# Patient Record
Sex: Female | Born: 1955 | Race: Black or African American | Hispanic: No | Smoking: Current every day smoker
Health system: Southern US, Community
[De-identification: ages and names within clinical notes are randomized; demographics above are authoritative.]

## PROBLEM LIST (undated history)

## (undated) DIAGNOSIS — G709 Myoneural disorder, unspecified: Secondary | ICD-10-CM

## (undated) DIAGNOSIS — I10 Essential (primary) hypertension: Secondary | ICD-10-CM

## (undated) DIAGNOSIS — G1221 Amyotrophic lateral sclerosis: Secondary | ICD-10-CM

## (undated) DIAGNOSIS — I671 Cerebral aneurysm, nonruptured: Secondary | ICD-10-CM

## (undated) DIAGNOSIS — G43909 Migraine, unspecified, not intractable, without status migrainosus: Secondary | ICD-10-CM

## (undated) DIAGNOSIS — G35 Multiple sclerosis: Secondary | ICD-10-CM

## (undated) HISTORY — PX: BREAST BIOPSY: SHX20

---

## 1999-01-16 ENCOUNTER — Other Ambulatory Visit: Admission: RE | Admit: 1999-01-16 | Discharge: 1999-01-16 | Payer: Self-pay | Admitting: Family Medicine

## 1999-02-11 ENCOUNTER — Ambulatory Visit (HOSPITAL_COMMUNITY): Admission: RE | Admit: 1999-02-11 | Discharge: 1999-02-11 | Payer: Self-pay | Admitting: Family Medicine

## 1999-03-04 ENCOUNTER — Ambulatory Visit (HOSPITAL_COMMUNITY): Admission: RE | Admit: 1999-03-04 | Discharge: 1999-03-04 | Payer: Self-pay | Admitting: Family Medicine

## 2000-02-16 ENCOUNTER — Other Ambulatory Visit: Admission: RE | Admit: 2000-02-16 | Discharge: 2000-02-16 | Payer: Self-pay | Admitting: Family Medicine

## 2000-03-07 ENCOUNTER — Ambulatory Visit (HOSPITAL_COMMUNITY): Admission: RE | Admit: 2000-03-07 | Discharge: 2000-03-07 | Payer: Self-pay

## 2002-01-25 ENCOUNTER — Other Ambulatory Visit: Admission: RE | Admit: 2002-01-25 | Discharge: 2002-01-25 | Payer: Self-pay | Admitting: *Deleted

## 2002-01-29 ENCOUNTER — Encounter: Payer: Self-pay | Admitting: *Deleted

## 2002-01-29 ENCOUNTER — Ambulatory Visit (HOSPITAL_COMMUNITY): Admission: RE | Admit: 2002-01-29 | Discharge: 2002-01-29 | Payer: Self-pay | Admitting: *Deleted

## 2002-02-08 ENCOUNTER — Encounter: Payer: Self-pay | Admitting: *Deleted

## 2002-02-08 ENCOUNTER — Ambulatory Visit (HOSPITAL_COMMUNITY): Admission: RE | Admit: 2002-02-08 | Discharge: 2002-02-08 | Payer: Self-pay | Admitting: *Deleted

## 2004-01-05 ENCOUNTER — Emergency Department (HOSPITAL_COMMUNITY): Admission: EM | Admit: 2004-01-05 | Discharge: 2004-01-05 | Payer: Self-pay | Admitting: Emergency Medicine

## 2005-01-22 ENCOUNTER — Emergency Department (HOSPITAL_COMMUNITY): Admission: EM | Admit: 2005-01-22 | Discharge: 2005-01-22 | Payer: Self-pay | Admitting: Emergency Medicine

## 2007-05-31 ENCOUNTER — Inpatient Hospital Stay (HOSPITAL_COMMUNITY): Admission: AD | Admit: 2007-05-31 | Discharge: 2007-05-31 | Payer: Self-pay | Admitting: Gynecology

## 2007-06-02 ENCOUNTER — Inpatient Hospital Stay (HOSPITAL_COMMUNITY): Admission: AD | Admit: 2007-06-02 | Discharge: 2007-06-02 | Payer: Self-pay | Admitting: Gynecology

## 2008-03-14 ENCOUNTER — Emergency Department (HOSPITAL_COMMUNITY): Admission: EM | Admit: 2008-03-14 | Discharge: 2008-03-14 | Payer: Self-pay | Admitting: Emergency Medicine

## 2008-03-14 ENCOUNTER — Inpatient Hospital Stay (HOSPITAL_COMMUNITY): Admission: AD | Admit: 2008-03-14 | Discharge: 2008-03-14 | Payer: Self-pay | Admitting: Gynecology

## 2008-05-08 ENCOUNTER — Inpatient Hospital Stay (HOSPITAL_COMMUNITY): Admission: AD | Admit: 2008-05-08 | Discharge: 2008-05-08 | Payer: Self-pay | Admitting: Obstetrics & Gynecology

## 2008-05-22 ENCOUNTER — Ambulatory Visit: Payer: Self-pay | Admitting: Obstetrics & Gynecology

## 2008-05-22 ENCOUNTER — Encounter: Payer: Self-pay | Admitting: Family

## 2008-05-22 ENCOUNTER — Other Ambulatory Visit: Admission: RE | Admit: 2008-05-22 | Discharge: 2008-05-22 | Payer: Self-pay | Admitting: Obstetrics & Gynecology

## 2008-06-20 ENCOUNTER — Ambulatory Visit: Payer: Self-pay | Admitting: Gynecology

## 2008-08-15 ENCOUNTER — Emergency Department (HOSPITAL_COMMUNITY): Admission: EM | Admit: 2008-08-15 | Discharge: 2008-08-15 | Payer: Self-pay | Admitting: Emergency Medicine

## 2008-08-26 ENCOUNTER — Ambulatory Visit: Payer: Self-pay | Admitting: Internal Medicine

## 2008-08-27 ENCOUNTER — Ambulatory Visit: Payer: Self-pay | Admitting: *Deleted

## 2008-09-19 ENCOUNTER — Ambulatory Visit: Payer: Self-pay | Admitting: Gynecology

## 2008-09-19 ENCOUNTER — Other Ambulatory Visit: Admission: RE | Admit: 2008-09-19 | Discharge: 2008-09-19 | Payer: Self-pay | Admitting: Obstetrics & Gynecology

## 2009-01-13 ENCOUNTER — Inpatient Hospital Stay (HOSPITAL_COMMUNITY): Admission: AD | Admit: 2009-01-13 | Discharge: 2009-01-13 | Payer: Self-pay | Admitting: Obstetrics & Gynecology

## 2009-05-08 ENCOUNTER — Emergency Department (HOSPITAL_COMMUNITY): Admission: EM | Admit: 2009-05-08 | Discharge: 2009-05-08 | Payer: Self-pay | Admitting: Family Medicine

## 2009-06-02 ENCOUNTER — Ambulatory Visit: Payer: Self-pay | Admitting: Obstetrics & Gynecology

## 2009-06-03 ENCOUNTER — Encounter: Payer: Self-pay | Admitting: Obstetrics & Gynecology

## 2009-06-05 ENCOUNTER — Ambulatory Visit: Payer: Self-pay | Admitting: Obstetrics and Gynecology

## 2009-06-06 ENCOUNTER — Encounter: Payer: Self-pay | Admitting: Obstetrics & Gynecology

## 2009-06-06 LAB — CONVERTED CEMR LAB

## 2009-06-14 ENCOUNTER — Emergency Department (HOSPITAL_COMMUNITY): Admission: EM | Admit: 2009-06-14 | Discharge: 2009-06-14 | Payer: Self-pay | Admitting: Family Medicine

## 2009-09-10 ENCOUNTER — Ambulatory Visit: Payer: Self-pay | Admitting: Family Medicine

## 2009-09-10 ENCOUNTER — Encounter: Payer: Self-pay | Admitting: Family Medicine

## 2009-09-18 ENCOUNTER — Ambulatory Visit (HOSPITAL_COMMUNITY): Admission: RE | Admit: 2009-09-18 | Discharge: 2009-09-18 | Payer: Self-pay | Admitting: Family Medicine

## 2009-11-05 ENCOUNTER — Ambulatory Visit: Payer: Self-pay | Admitting: Internal Medicine

## 2009-11-05 ENCOUNTER — Encounter: Payer: Self-pay | Admitting: Family Medicine

## 2009-11-05 LAB — CONVERTED CEMR LAB
ALT: 15 units/L (ref 0–35)
AST: 18 units/L (ref 0–37)
Albumin: 4.3 g/dL (ref 3.5–5.2)
BUN: 18 mg/dL (ref 6–23)
Calcium: 9.4 mg/dL (ref 8.4–10.5)
Chloride: 103 meq/L (ref 96–112)
Eosinophils Absolute: 0.1 10*3/uL (ref 0.0–0.7)
HCT: 37.2 % (ref 36.0–46.0)
LDL Cholesterol: 131 mg/dL — ABNORMAL HIGH (ref 0–99)
Lymphocytes Relative: 29 % (ref 12–46)
Lymphs Abs: 3.2 10*3/uL (ref 0.7–4.0)
MCV: 103.3 fL — ABNORMAL HIGH (ref 78.0–100.0)
Monocytes Relative: 6 % (ref 3–12)
Neutrophils Relative %: 63 % (ref 43–77)
Potassium: 3.9 meq/L (ref 3.5–5.3)
RBC: 3.6 M/uL — ABNORMAL LOW (ref 3.87–5.11)
Sed Rate: 15 mm/hr (ref 0–22)
Sodium: 140 meq/L (ref 135–145)
TSH: 1.49 microintl units/mL (ref 0.350–4.500)
Total Protein: 7 g/dL (ref 6.0–8.3)
WBC: 11 10*3/uL — ABNORMAL HIGH (ref 4.0–10.5)

## 2009-11-12 ENCOUNTER — Ambulatory Visit: Payer: Self-pay | Admitting: Internal Medicine

## 2009-12-05 ENCOUNTER — Ambulatory Visit (HOSPITAL_COMMUNITY): Admission: RE | Admit: 2009-12-05 | Discharge: 2009-12-05 | Payer: Self-pay | Admitting: Family Medicine

## 2009-12-24 ENCOUNTER — Encounter: Payer: Self-pay | Admitting: Family Medicine

## 2009-12-24 ENCOUNTER — Ambulatory Visit: Payer: Self-pay | Admitting: Family Medicine

## 2009-12-24 LAB — CONVERTED CEMR LAB
CO2: 26 meq/L (ref 19–32)
Chloride: 106 meq/L (ref 96–112)
Potassium: 4.5 meq/L (ref 3.5–5.3)
Sodium: 142 meq/L (ref 135–145)

## 2009-12-31 ENCOUNTER — Encounter: Admission: RE | Admit: 2009-12-31 | Discharge: 2010-01-26 | Payer: Self-pay | Admitting: Family Medicine

## 2010-02-16 ENCOUNTER — Ambulatory Visit: Payer: Self-pay | Admitting: Internal Medicine

## 2010-08-09 ENCOUNTER — Emergency Department (HOSPITAL_COMMUNITY): Admission: EM | Admit: 2010-08-09 | Discharge: 2010-08-09 | Payer: Self-pay | Admitting: Emergency Medicine

## 2010-10-19 ENCOUNTER — Emergency Department (HOSPITAL_COMMUNITY): Admission: EM | Admit: 2010-10-19 | Discharge: 2010-10-19 | Payer: Self-pay | Admitting: Emergency Medicine

## 2010-11-02 ENCOUNTER — Emergency Department (HOSPITAL_COMMUNITY)
Admission: EM | Admit: 2010-11-02 | Discharge: 2010-11-02 | Payer: Self-pay | Source: Home / Self Care | Admitting: Emergency Medicine

## 2011-04-05 LAB — POCT URINALYSIS DIP (DEVICE)
Bilirubin Urine: NEGATIVE
Ketones, ur: NEGATIVE mg/dL
Protein, ur: NEGATIVE mg/dL
Specific Gravity, Urine: 1.02 (ref 1.005–1.030)
pH: 5 (ref 5.0–8.0)

## 2011-04-06 LAB — POCT URINALYSIS DIP (DEVICE)
Hgb urine dipstick: NEGATIVE
Ketones, ur: NEGATIVE mg/dL
Protein, ur: NEGATIVE mg/dL
Specific Gravity, Urine: 1.02 (ref 1.005–1.030)
Urobilinogen, UA: 0.2 mg/dL (ref 0.0–1.0)

## 2011-04-12 LAB — WET PREP, GENITAL

## 2011-04-12 LAB — GC/CHLAMYDIA PROBE AMP, GENITAL
Chlamydia, DNA Probe: NEGATIVE
GC Probe Amp, Genital: NEGATIVE

## 2011-05-11 NOTE — Group Therapy Note (Signed)
NAME:  Darlene Mcconnell, Darlene Mcconnell NO.:  [id number]   MEDICAL RECORD NO.:  [id number]          PATIENT TYPE:  WOC   LOCATION:  [facility]                   FACILITY:  [facility]   PHYSICIAN:  Ginger Carne, MD DATE OF BIRTH:  09/04/1956   DATE OF SERVICE:                                  CLINIC NOTE   HISTORY:  Darlene Mcconnell returns today for followup on an endometrial  biopsy performed on 05/22/08 because of postmenopausal bleeding.  Pap  smear had also indicated evidence of Trichomonas, but no other  abnormalities.  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  Pelvic sonogram revealed no  evidence of uterine fibroids, a small simple 3.4 cm right ovarian cyst  was noted and the left ovary was normal.   IMPRESSION/PLAN:  The patient was treated with metronidazole 500 mg  twice a day for 7 days for Trichomonas and Provera 20 mg a day for the  first 10 calendar days of each month for 6 months.  Asked to return in 3  months for a repeat endometrial biopsy.  The patient verbalized  understanding of her diagnoses and necessity for followup.           ______________________________  Ginger Carne, MD     SHB/MEDQ  D:  06/20/2008  T:  06/20/2008  Job:  [id number]

## 2011-05-11 NOTE — Group Therapy Note (Signed)
NAME:  Darlene Mcconnell, Darlene Mcconnell          ACCOUNT NO.:  [id number]   MEDICAL RECORD NO.:  [id number]          PATIENT TYPE:  WOC   LOCATION:  [facility]                   FACILITY:  [facility]   PHYSICIAN:  Sid Falcon, CNM  DATE OF BIRTH:  [birth year]   DATE OF SERVICE:                                  CLINIC NOTE   Ms. Durkin reports today with complaints of a history of right pelvic  pain.  Was seen on maternity admission on May 08, 2008 where a pelvic  ultrasound was done.  Result of that ultrasound included no evidence of  uterine fibroids, heterogeneous myometrium with suspicion of adenomyosis  and a 3.4 cm right ovarian cyst.  No evidence of free fluid and normal  left ovary.   ALLERGIES:  NO KNOWN DRUG ALLERGIES.   MENSTRUAL HISTORY:  The patient stopped with menstrual cycle greater  than a year ago.  Reported the cycle was regular before then, slight  flow.   OBSTETRICAL HISTORY:  Three pregnancies with two children and   Dictation ended at this point.      Sid Falcon, CNM     WM/MEDQ  D:  05/22/2008  T:  05/22/2008  Job:  [id number]

## 2011-05-11 NOTE — Group Therapy Note (Signed)
NAME:  Darlene Mcconnell, Darlene Mcconnell NO.:  [id number]   MEDICAL RECORD NO.:  [id number]          PATIENT TYPE:  WOC   LOCATION:  [facility]                   FACILITY:  [facility]   PHYSICIAN:  Elsie Lincoln, MD      DATE OF BIRTH:  1956-12-23   DATE OF SERVICE:                                  CLINIC NOTE   Patient is here with report of right lower quadrant pain.  The patient  is menopausal with return of bleeding, and that occurred on May 08, 2008.  Was seen at maternity admissions floor for bleeding and pelvic  pain.   ALLERGIES:  No known drug allergies.   OBSTETRICAL HISTORY:  History of three pregnancies with two children.   GYNECOLOGIC HISTORY:  Last Pap was 16 years ago.  No history of abnormal  Pap smear.   PAST SURGICAL HISTORY:  Denies.   FAMILY HISTORY:  Father had a heart attack.   PERSONAL MEDICAL HISTORY:  Patient denies any past problems.   SOCIAL HISTORY:  Patient does smoke.  Does work outside the home.  She  does smoke a quarter pack a day x6 years.  Drinks alcohol approximately  two times a week.  No other significant social history issues.   REVIEW OF SYSTEMS:  Patient has night sweats, headaches, and hot flashes  associated with the recent bleeding and in between related to menopausal  symptoms.   PHYSICAL ASSESSMENT:  Patient is alert and oriented x3.  Ultrasound  results on May 08, 2008 had no evidence of uterine fibroids,  heterogenous myometrium, a vague suspicion of adenomyosis, a 3.4 simple  right ovarian cyst, and normal left ovary.  ABDOMEN:  Nontender.  No hepatosplenomegaly.  PELVIC:  External genitalia.  No abnormal lesions.  No abnormal  discharge.  Vagina:  Rugated.  White creamy discharge.  Positive odor.  Cervix 3 x 3 cm.  No cervical motion tenderness.  Patent os.  No  lesions.  Uterus normal.  Midline.  No dominant masses.  Adnexa:  No  masses or tenderness bilaterally.   An endometrial biopsy was obtained by Dr. Perlie Gold without  difficulty.  Sample collected.   ASSESSMENT:  1. Postmenopausal bleeding.  2. Intermittent pelvic pain.   PLAN:  Endometrial biopsy to lab.  Pap smear to lab.  Will follow up in  one week for results.      Eino Farber Jerolyn Center, CNM    ______________________________  Elsie Lincoln, MD    WM/MEDQ  D:  05/22/2008  T:  05/22/2008  Job:  [id number]

## 2011-05-11 NOTE — Group Therapy Note (Signed)
Darlene Mcconnell, STREET NO.:  [id number]   MEDICAL RECORD NO.:  [id number]          PATIENT TYPE:  WOC   LOCATION:  [facility]                   FACILITY:  [facility]   PHYSICIAN:  Johnella Moloney, MD        DATE OF BIRTH:  [birth year]   DATE OF SERVICE:  09/19/2008                                  CLINIC NOTE   REASON FOR VISIT:  Repeat endometrial biopsy.   PERTINENT HISTORY:  The patient is a 55 year old menopausal female who  was seen initially back in May with complaint of persistent bleeding.  At that time, the patient had endometrial biopsy performed.  The  pathology report from that endometrial biopsy showed disordered  proliferative pattern with focal simple hyperplasia and without evidence  of atypia.  Given the focal simple hyperplasia, the patient is brought  back for 108-month repeat endometrial biopsy and to again sample the  endometrial tissue.  Since this previous visit, the patient has been on  Provera on the first 10 days of the month with a planned 72-month  treatment course.  The patient states that she is not having any more  episodes of bleeding or other pelvic pain.  The patient states that she  has been intermittently adherent to this regimen, sometimes forgetting  to take her pills at the first of the month and often skipping multiple  doses in a regimen.  Nonetheless, the patient states that she has had  resolution of her bleeding.  She presents with no other complaints  today.  The reason for her endometrial biopsy as a repeat today was  discussed.  The patient is in agreement and understanding, consent was  obtained.  The patient identified and procedure was performed.   PHYSICAL EXAMINATION:  VITAL SIGNS:  The patient is afebrile at 97.6,  pulse 83, blood pressure 127/78, and respiratory rate is 20.  GENERAL:  A well-appearing female in no acute distress.  UROGENITAL:  Normal external female genital anatomy.  Vaginal mucosa is  pink and moist.   Vaginal wall is rugated.  Cervix appears normal in  size.  Os is closed.  There is no cervical motion tenderness.  Uterus is  sounded to a depth of 9 cm.  Endometrial biopsy was obtained with scanty  tissue obtained.  There was a small amount of bleeding from the  tenaculum, controlled with pressure and silver nitrate.  The patient  tolerated the procedure well.  Sample will be sent to pathology.   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  1. Repeat endometrial biopsy.  This has been performed and the patient      will return in 2 weeks for discussion of these results.  The      patient is in agreement and understanding.  2. Status post Pap smear.  The patient had a Pap smear done at her      last visit, this was normal.  3. Disposition.  The patient is to return in 2 weeks  for discussion of      endometrial biopsy results.     ______________________________  Myrtie Soman, MD    ______________________________  Johnella Moloney, MD    TE/MEDQ  D:  09/19/2008  T:  09/20/2008  Job:  [id number]

## 2011-05-11 NOTE — Group Therapy Note (Signed)
NAME:  Darlene Mcconnell, WALSH NO.:  [id number]   MEDICAL RECORD NO.:  [id number]          PATIENT TYPE:  WOC   LOCATION:  [facility]                   FACILITY:  [facility]   PHYSICIAN:  Maylon Cos, CNM    DATE OF BIRTH:  01/15/1956   DATE OF SERVICE:                                  CLINIC NOTE   The patient presents today with complaint of creamy white discharge x3  weeks.  She states that the discharge has no odor.  It is causing no  itching or irritation.  However, it is annoying to her and she would  like to have a wet prep.  She did present to the clinic 2 days ago for  evaluation of burning with urination that she experienced x2.  UA dip  was negative.  Urine culture confirmed no infection.   PHYSICAL EXAMINATION:  VITAL SIGNS:  Today, her vital signs are stable.  Temperature is 97.3.  Her pulse is 85.  Her blood pressure is 132/79.  Her weight is 144.2 and her height is 63 inches.  ABDOMEN:  Benign.  She has a soft abdomen that is nontender.  There is  no masses.  GENITALIA:  She is Tanner 5 with pink mucous membranes.  Irregular rugae  and moderate tone.  There is a scant amount of creamy white discharge  with no obvious odor noted in the vault.  BIMANUAL:  Unremarkable.  No cervical motion tenderness.  The uterus is  not enlarged and nontender.  Adnexa are nonenlarged and nontender.   ASSESSMENT:  Vaginal discharge, likely leukorrhea.   PLAN:  Wet prep sent for analysis.  Comfort measures reviewed with the  patient including the frequent use of changing panty liners to feel  pressure and baking soda soaks, half cup of baking soda in bath water  for comfort.           ______________________________  Maylon Cos, CNM     SS/MEDQ  D:  06/05/2009  T:  06/06/2009  Job:  [id number]

## 2011-09-20 LAB — GC/CHLAMYDIA PROBE AMP, GENITAL: Chlamydia, DNA Probe: NEGATIVE

## 2011-09-20 LAB — WET PREP, GENITAL

## 2011-09-27 LAB — POCT PREGNANCY, URINE: Preg Test, Ur: NEGATIVE

## 2011-10-14 LAB — URINALYSIS, ROUTINE W REFLEX MICROSCOPIC
Bilirubin Urine: NEGATIVE
Bilirubin Urine: NEGATIVE
Leukocytes, UA: NEGATIVE
Nitrite: NEGATIVE
Nitrite: NEGATIVE
Specific Gravity, Urine: 1.025
Specific Gravity, Urine: >1.03 — ABNORMAL HIGH
Urobilinogen, UA: 0.2
Urobilinogen, UA: 0.2
pH: 5.5
pH: 6

## 2011-10-14 LAB — URINE MICROSCOPIC-ADD ON

## 2011-10-14 LAB — GC/CHLAMYDIA PROBE AMP, GENITAL
Chlamydia, DNA Probe: NEGATIVE
GC Probe Amp, Genital: NEGATIVE

## 2011-10-14 LAB — URINE CULTURE: Colony Count: NO GROWTH

## 2011-10-14 LAB — POCT PREGNANCY, URINE: Preg Test, Ur: NEGATIVE

## 2011-12-07 ENCOUNTER — Inpatient Hospital Stay (HOSPITAL_COMMUNITY)
Admission: AD | Admit: 2011-12-07 | Discharge: 2011-12-07 | Disposition: A | Discharge status: Home / Self Care | Payer: Self-pay | Source: Ambulatory Visit | Attending: Obstetrics & Gynecology | Admitting: Obstetrics & Gynecology

## 2011-12-07 ENCOUNTER — Encounter (HOSPITAL_COMMUNITY): Payer: Self-pay | Admitting: *Deleted

## 2011-12-07 DIAGNOSIS — N760 Acute vaginitis: Secondary | ICD-10-CM | POA: Insufficient documentation

## 2011-12-07 DIAGNOSIS — A499 Bacterial infection, unspecified: Secondary | ICD-10-CM | POA: Insufficient documentation

## 2011-12-07 DIAGNOSIS — N949 Unspecified condition associated with female genital organs and menstrual cycle: Secondary | ICD-10-CM | POA: Insufficient documentation

## 2011-12-07 DIAGNOSIS — B9689 Other specified bacterial agents as the cause of diseases classified elsewhere: Secondary | ICD-10-CM | POA: Insufficient documentation

## 2011-12-07 HISTORY — DX: Essential (primary) hypertension: I10

## 2011-12-07 LAB — WET PREP, GENITAL
Trich, Wet Prep: NONE SEEN
Yeast Wet Prep HPF POC: NONE SEEN

## 2011-12-07 MED ORDER — METRONIDAZOLE 500 MG PO TABS: 500.0000 mg | ORAL_TABLET | Freq: Two times a day (BID) | ORAL | Status: AC

## 2011-12-07 NOTE — ED Provider Notes (Signed)
History   Pt presents today c/o "something in her vagina." She states she had intercourse yesterday and since that time she thinks she can feel a hard, round object. She states she has been trying to get it out herself and thinks her "digging made her bleed." She denies fever or any other sx.  Chief Complaint  Patient presents with  . Vaginal Bleeding   HPI  OB History    Grav Para Term Preterm Abortions TAB SAB Ect Mult Living   3 2 2  1 1    2       Past Medical History  Diagnosis Date  . Hypertension     Past Surgical History  Procedure Date  . No past surgeries     No family history on file.  History  Substance Use Topics  . Smoking status: Current Everyday Smoker -- 0.2 packs/day    Types: Cigarettes  . Smokeless tobacco: Not on file  . Alcohol Use: No    Allergies: No Known Allergies  No prescriptions prior to admission    Review of Systems  Constitutional: Negative for fever.  Eyes: Negative for blurred vision.  Cardiovascular: Negative for chest pain.  Gastrointestinal: Negative for nausea, vomiting, diarrhea and constipation.  Genitourinary: Negative for dysuria, urgency, frequency and hematuria.  Neurological: Negative for dizziness and headaches.  Psychiatric/Behavioral: Negative for depression and suicidal ideas.   Physical Exam   Blood pressure 176/93, pulse 91, temperature 98.2 F (36.8 C), temperature source Oral, resp. rate 18, height 5\' 3"  (1.6 m), weight 150 lb (68.04 kg).  Physical Exam  Nursing note and vitals reviewed. Constitutional: She is oriented to person, place, and time. She appears well-developed and well-nourished. No distress.  HENT:  Head: Normocephalic and atraumatic.  Eyes: EOM are normal. Pupils are equal, round, and reactive to light.  GI: Soft. She exhibits no distension and no mass. There is no tenderness. There is no rebound and no guarding.  Genitourinary: There is bleeding around the vagina. Vaginal discharge found.         No foreign objects noted. Pt with small tear on left vaginal sidewall.  Neurological: She is alert and oriented to person, place, and time.  Skin: Skin is warm and dry. She is not diaphoretic.  Psychiatric: She has a normal mood and affect. Her behavior is normal. Judgment and thought content normal.    MAU Course  Procedures  Wet prep and GC/Chlamydia cultures done.  Results for orders placed during the hospital encounter of 12/07/11 (from the past 24 hour(s))  WET PREP, GENITAL     Status: Abnormal   Collection Time   12/07/11  1:00 PM      Component Value Range   Yeast, Wet Prep NONE SEEN  NONE SEEN    Trich, Wet Prep NONE SEEN  NONE SEEN    Clue Cells, Wet Prep FEW (*) NONE SEEN    WBC, Wet Prep HPF POC FEW (*) NONE SEEN     Assessment and Plan  BV: discussed with pt at length. Will tx with Flagyl. Warned of antabuse reaction. Discussed diet, activity, risks, and precautions.  Clinton Gallant. Rice III, DrHSc, MPAS, PA-C  12/07/2011, 1:07 PM   Henrietta Hoover, PA 12/07/11 1333

## 2011-12-07 NOTE — Progress Notes (Signed)
Pt states that she has a foreign object lodged in her vagina; pt tried to remove object by herself but was unsuccessful;

## 2011-12-08 LAB — GC/CHLAMYDIA PROBE AMP, GENITAL: GC Probe Amp, Genital: NEGATIVE

## 2011-12-28 DIAGNOSIS — I671 Cerebral aneurysm, nonruptured: Secondary | ICD-10-CM

## 2011-12-28 HISTORY — DX: Cerebral aneurysm, nonruptured: I67.1

## 2012-08-01 ENCOUNTER — Emergency Department (HOSPITAL_COMMUNITY)
Admission: EM | Admit: 2012-08-01 | Discharge: 2012-08-01 | Disposition: A | Discharge status: Home / Self Care | Payer: Self-pay | Attending: Emergency Medicine | Admitting: Emergency Medicine

## 2012-08-01 ENCOUNTER — Encounter (HOSPITAL_COMMUNITY): Payer: Self-pay | Admitting: Emergency Medicine

## 2012-08-01 ENCOUNTER — Emergency Department (HOSPITAL_COMMUNITY): Payer: Self-pay

## 2012-08-01 DIAGNOSIS — R51 Headache: Secondary | ICD-10-CM | POA: Insufficient documentation

## 2012-08-01 DIAGNOSIS — F17200 Nicotine dependence, unspecified, uncomplicated: Secondary | ICD-10-CM | POA: Insufficient documentation

## 2012-08-01 DIAGNOSIS — I10 Essential (primary) hypertension: Secondary | ICD-10-CM | POA: Insufficient documentation

## 2012-08-01 DIAGNOSIS — R233 Spontaneous ecchymoses: Secondary | ICD-10-CM | POA: Insufficient documentation

## 2012-08-01 DIAGNOSIS — F1410 Cocaine abuse, uncomplicated: Secondary | ICD-10-CM | POA: Insufficient documentation

## 2012-08-01 DIAGNOSIS — Z79899 Other long term (current) drug therapy: Secondary | ICD-10-CM | POA: Insufficient documentation

## 2012-08-01 LAB — CBC WITH DIFFERENTIAL/PLATELET
Basophils Absolute: 0.1 10*3/uL (ref 0.0–0.1)
Basophils Relative: 0 % (ref 0–1)
Eosinophils Relative: 1 % (ref 0–5)
HCT: 37.5 % (ref 36.0–46.0)
MCHC: 34.7 g/dL (ref 30.0–36.0)
Monocytes Absolute: 1 10*3/uL (ref 0.1–1.0)
Neutro Abs: 5.6 10*3/uL (ref 1.7–7.7)
RDW: 14.2 % (ref 11.5–15.5)

## 2012-08-01 LAB — URINALYSIS, ROUTINE W REFLEX MICROSCOPIC
Glucose, UA: NEGATIVE mg/dL
Specific Gravity, Urine: 1.014 (ref 1.005–1.030)
pH: 7 (ref 5.0–8.0)

## 2012-08-01 LAB — RAPID URINE DRUG SCREEN, HOSP PERFORMED
Amphetamines: NOT DETECTED
Cocaine: POSITIVE — AB
Opiates: NOT DETECTED
Tetrahydrocannabinol: POSITIVE — AB

## 2012-08-01 LAB — COMPREHENSIVE METABOLIC PANEL
AST: 17 U/L (ref 0–37)
Albumin: 4 g/dL (ref 3.5–5.2)
Calcium: 9.9 mg/dL (ref 8.4–10.5)
Chloride: 103 mEq/L (ref 96–112)
Creatinine, Ser: 0.84 mg/dL (ref 0.50–1.10)

## 2012-08-01 LAB — URINE MICROSCOPIC-ADD ON

## 2012-08-01 MED ORDER — OXYCODONE-ACETAMINOPHEN 5-325 MG PO TABS
2.0000 | ORAL_TABLET | Freq: Once | ORAL | Status: AC
  Administered 2012-08-01: 2 via ORAL
  Filled 2012-08-01: qty 2

## 2012-08-01 MED ORDER — HYDROCHLOROTHIAZIDE 25 MG PO TABS: 25.0000 mg | ORAL_TABLET | Freq: Every day | ORAL | Status: DC

## 2012-08-01 NOTE — ED Notes (Signed)
Pt states she does have hypertension, but she has run out of her medication and has not taken medication lately.

## 2012-08-01 NOTE — ED Notes (Signed)
Wait time discussed x4

## 2012-08-01 NOTE — ED Provider Notes (Signed)
History     CSN: [id number]  Arrival date & time 08/01/12  1235   First MD Initiated Contact with Patient 08/01/12 1711      Chief Complaint  Patient presents with  . Headache    (Consider location/radiation/quality/duration/timing/severity/associated sxs/prior treatment) HPI Comments: Darlene Mcconnell is a 56 y.o. Female who presents for evaluation of a headache for 3 days. The headache is constant. It did not improve when she took hydrocodone yesterday. She has nausea without vomiting, or diarrhea. No fever, chills, cough, shortness of breath, chest pain, weakness, or dizziness. She noticed a rash on her left arm today. She denies trauma to the left arm. There are no palliative factors. She supposed to be on hydro-chlorothiazide, but stopped it 2 months ago, when she ran out.  Patient is a 56 y.o. female presenting with headaches. The history is provided by the patient.  Headache     Past Medical History  Diagnosis Date  . Hypertension     Past Surgical History  Procedure Date  . No past surgeries     History reviewed. No pertinent family history.  History  Substance Use Topics  . Smoking status: Current Everyday Smoker -- 0.2 packs/day    Types: Cigarettes  . Smokeless tobacco: Not on file  . Alcohol Use: No    OB History    Grav Para Term Preterm Abortions TAB SAB Ect Mult Living   3 2 2  1 1    2       Review of Systems  Neurological: Positive for headaches.  All other systems reviewed and are negative.    Allergies  Review of patient's allergies indicates no known allergies.  Home Medications   Current Outpatient Rx  Name Route Sig Dispense Refill  . HYDROCHLOROTHIAZIDE 25 MG PO TABS Oral Take 25 mg by mouth daily.    Marland Kitchen HYDROCHLOROTHIAZIDE 25 MG PO TABS Oral Take 1 tablet (25 mg total) by mouth daily. 90 tablet 0    BP 154/69  Pulse 52  Temp 97.8 F (36.6 C) (Oral)  Resp 19  SpO2 99%  Physical Exam  Nursing note and vitals  reviewed. Constitutional: She is oriented to person, place, and time. She appears well-developed and well-nourished.  HENT:  Head: Normocephalic and atraumatic.  Eyes: Conjunctivae and EOM are normal. Pupils are equal, round, and reactive to light.  Neck: Normal range of motion and phonation normal. Neck supple. No thyromegaly present.  Cardiovascular: Normal rate, regular rhythm and intact distal pulses.   Pulmonary/Chest: Effort normal and breath sounds normal. No respiratory distress. She has no wheezes. She exhibits no tenderness.  Abdominal: Soft. She exhibits no distension. There is no tenderness. There is no guarding.  Musculoskeletal: Normal range of motion.  Neurological: She is alert and oriented to person, place, and time. She has normal strength. She exhibits normal muscle tone.  Skin: Skin is warm and dry.       Left upper medial arm, with flat nonblanching irregular, purple colored lesions. No associated vesicles, bleeding, or drainage.  Psychiatric: Her behavior is normal. Judgment and thought content normal.       Anxious    ED Course  Procedures (including critical care time)  Labs Reviewed  CBC WITH DIFFERENTIAL - Abnormal; Notable for the following:    WBC 12.6 (*)     RBC 3.76 (*)     MCH 34.6 (*)     Lymphocytes Relative 47 (*)     Lymphs Abs 5.9 (*)  All other components within normal limits  COMPREHENSIVE METABOLIC PANEL - Abnormal; Notable for the following:    GFR calc non Af Amer 77 (*)     GFR calc Af Amer 90 (*)     All other components within normal limits  URINALYSIS, ROUTINE W REFLEX MICROSCOPIC - Abnormal; Notable for the following:    Hgb urine dipstick TRACE (*)     All other components within normal limits  URINE RAPID DRUG SCREEN (HOSP PERFORMED) - Abnormal; Notable for the following:    Cocaine POSITIVE (*)     Tetrahydrocannabinol POSITIVE (*)     All other components within normal limits  URINE MICROSCOPIC-ADD ON - Abnormal; Notable for  the following:    Squamous Epithelial / LPF MANY (*)     All other components within normal limits   Ct Head Wo Contrast  08/01/2012  *RADIOLOGY REPORT*  Clinical Data:  Headaches  CT HEAD WITHOUT CONTRAST  Technique:  Contiguous axial images were obtained from the base of the skull through the vertex without contrast  Comparison:  01/05/2004  Findings:  The brain has a normal appearance without evidence for hemorrhage, acute infarction, hydrocephalus, or mass lesion.  There is no extra axial fluid collection.  The skull and paranasal sinuses are normal. Stable incidental small calcified lesion along the dura in the frontal region compatible with an incidental calcified meningioma.  IMPRESSION: Normal CT of the head without contrast.  Original Report Authenticated By: Judie Petit. Ruel Favors, M.D.     1. Headache   2. Petechiae   3. Cocaine abuse   4. Hypertension       MDM  Nonspecific, and persistent, headache, without component of thunderclap. Untreated, hypertension.        Flint Melter, MD 08/02/12 [phone]

## 2012-08-01 NOTE — ED Notes (Signed)
MD at bedside. 

## 2012-08-01 NOTE — ED Notes (Signed)
Pt c/o HA x 3 days behind left eye and some neck pain; pt sts rash on left arm today; pt denies vision change but sts some nausea

## 2012-08-01 NOTE — ED Notes (Signed)
Patient is resting comfortably. 

## 2012-08-01 NOTE — ED Notes (Signed)
Pt advised of wait time 

## 2012-08-01 NOTE — ED Notes (Signed)
Pt in room with family at bedside.  Pt is ready to leave, awaiting physician.

## 2012-08-01 NOTE — ED Notes (Signed)
Family at bedside. 

## 2012-08-16 ENCOUNTER — Emergency Department (HOSPITAL_COMMUNITY): Payer: Self-pay

## 2012-08-16 ENCOUNTER — Encounter (HOSPITAL_COMMUNITY): Payer: Self-pay | Admitting: Emergency Medicine

## 2012-08-16 ENCOUNTER — Inpatient Hospital Stay (HOSPITAL_COMMUNITY)
Admission: EM | Admit: 2012-08-16 | Discharge: 2012-08-20 | DRG: 027 | Disposition: A | Discharge status: Home / Self Care | Payer: MEDICAID | Attending: Neurosurgery | Admitting: Neurosurgery

## 2012-08-16 DIAGNOSIS — I10 Essential (primary) hypertension: Secondary | ICD-10-CM | POA: Diagnosis present

## 2012-08-16 DIAGNOSIS — F17200 Nicotine dependence, unspecified, uncomplicated: Secondary | ICD-10-CM | POA: Diagnosis present

## 2012-08-16 DIAGNOSIS — I671 Cerebral aneurysm, nonruptured: Secondary | ICD-10-CM

## 2012-08-16 DIAGNOSIS — H4900 Third [oculomotor] nerve palsy, unspecified eye: Secondary | ICD-10-CM | POA: Diagnosis present

## 2012-08-16 LAB — CBC WITH DIFFERENTIAL/PLATELET
Eosinophils Absolute: 0 10*3/uL (ref 0.0–0.7)
Hemoglobin: 13.1 g/dL (ref 12.0–15.0)
Lymphocytes Relative: 14 % (ref 12–46)
Lymphs Abs: 1.3 10*3/uL (ref 0.7–4.0)
MCH: 33.9 pg (ref 26.0–34.0)
MCV: 97.9 fL (ref 78.0–100.0)
Monocytes Relative: 1 % — ABNORMAL LOW (ref 3–12)
Neutrophils Relative %: 85 % — ABNORMAL HIGH (ref 43–77)
RBC: 3.86 MIL/uL — ABNORMAL LOW (ref 3.87–5.11)

## 2012-08-16 LAB — COMPREHENSIVE METABOLIC PANEL
Alkaline Phosphatase: 65 U/L (ref 39–117)
BUN: 10 mg/dL (ref 6–23)
CO2: 29 mEq/L (ref 19–32)
GFR calc Af Amer: >90 mL/min (ref >90)
GFR calc non Af Amer: >90 mL/min (ref >90)
Glucose, Bld: 120 mg/dL — ABNORMAL HIGH (ref 70–99)
Potassium: 3.4 mEq/L — ABNORMAL LOW (ref 3.5–5.1)
Total Bilirubin: 0.5 mg/dL (ref 0.3–1.2)
Total Protein: 7.9 g/dL (ref 6.0–8.3)

## 2012-08-16 LAB — URINALYSIS, ROUTINE W REFLEX MICROSCOPIC
Bilirubin Urine: NEGATIVE
Ketones, ur: NEGATIVE mg/dL
Leukocytes, UA: NEGATIVE
Nitrite: NEGATIVE
Protein, ur: NEGATIVE mg/dL
Urobilinogen, UA: 1 mg/dL (ref 0.0–1.0)
pH: 6.5 (ref 5.0–8.0)

## 2012-08-16 LAB — URINE MICROSCOPIC-ADD ON

## 2012-08-16 LAB — SAMPLE TO BLOOD BANK

## 2012-08-16 LAB — ABO/RH: ABO/RH(D): A POS

## 2012-08-16 MED ORDER — SODIUM CHLORIDE 0.9 % IV SOLN
INTRAVENOUS | Status: DC
  Administered 2012-08-16: 23:00:00 via INTRAVENOUS
  Administered 2012-08-17: 50 mL/h via INTRAVENOUS

## 2012-08-16 MED ORDER — DEXAMETHASONE SODIUM PHOSPHATE 4 MG/ML IJ SOLN
4.0000 mg | Freq: Four times a day (QID) | INTRAMUSCULAR | Status: DC
  Administered 2012-08-17 (×3): 4 mg via INTRAVENOUS
  Filled 2012-08-16 (×4): qty 1

## 2012-08-16 MED ORDER — PANTOPRAZOLE SODIUM 40 MG IV SOLR
40.0000 mg | Freq: Every day | INTRAVENOUS | Status: DC
  Administered 2012-08-16 – 2012-08-18 (×3): 40 mg via INTRAVENOUS
  Filled 2012-08-16 (×4): qty 40

## 2012-08-16 MED ORDER — DEXAMETHASONE SODIUM PHOSPHATE 10 MG/ML IJ SOLN
10.0000 mg | Freq: Once | INTRAMUSCULAR | Status: AC
  Administered 2012-08-16: 10 mg via INTRAVENOUS
  Filled 2012-08-16: qty 1

## 2012-08-16 MED ORDER — LEVETIRACETAM 500 MG/5ML IV SOLN
500.0000 mg | Freq: Two times a day (BID) | INTRAVENOUS | Status: DC
  Administered 2012-08-16 – 2012-08-18 (×5): 500 mg via INTRAVENOUS
  Filled 2012-08-16 (×6): qty 5

## 2012-08-16 MED ORDER — METOCLOPRAMIDE HCL 5 MG/ML IJ SOLN
10.0000 mg | Freq: Once | INTRAMUSCULAR | Status: AC
  Administered 2012-08-16: 10 mg via INTRAVENOUS
  Filled 2012-08-16: qty 2

## 2012-08-16 MED ORDER — OXYCODONE-ACETAMINOPHEN 5-325 MG PO TABS
1.0000 | ORAL_TABLET | Freq: Once | ORAL | Status: AC
  Administered 2012-08-16: 1 via ORAL
  Filled 2012-08-16: qty 1

## 2012-08-16 MED ORDER — IOHEXOL 350 MG/ML SOLN
50.0000 mL | Freq: Once | INTRAVENOUS | Status: AC | PRN
  Administered 2012-08-16: 50 mL via INTRAVENOUS

## 2012-08-16 MED ORDER — HYDROCHLOROTHIAZIDE 25 MG PO TABS
25.0000 mg | ORAL_TABLET | Freq: Every day | ORAL | Status: DC
  Administered 2012-08-17 – 2012-08-20 (×4): 25 mg via ORAL
  Filled 2012-08-16 (×5): qty 1

## 2012-08-16 MED ORDER — LABETALOL HCL 5 MG/ML IV SOLN
10.0000 mg | INTRAVENOUS | Status: DC | PRN
  Administered 2012-08-20: 10 mg via INTRAVENOUS
  Filled 2012-08-16: qty 4

## 2012-08-16 MED ORDER — MORPHINE SULFATE 2 MG/ML IJ SOLN
1.0000 mg | INTRAMUSCULAR | Status: DC | PRN
  Administered 2012-08-16 – 2012-08-17 (×4): 4 mg via INTRAVENOUS
  Administered 2012-08-17: 2 mg via INTRAVENOUS
  Filled 2012-08-16: qty 1
  Filled 2012-08-16 (×4): qty 2

## 2012-08-16 MED ORDER — ONDANSETRON HCL 4 MG/2ML IJ SOLN
4.0000 mg | Freq: Four times a day (QID) | INTRAMUSCULAR | Status: DC | PRN
  Administered 2012-08-16: 4 mg via INTRAVENOUS
  Filled 2012-08-16: qty 2

## 2012-08-16 MED ORDER — ACETAMINOPHEN 650 MG RE SUPP: 650.0000 mg | RECTAL | Status: DC | PRN

## 2012-08-16 MED ORDER — DIPHENHYDRAMINE HCL 50 MG/ML IJ SOLN
25.0000 mg | Freq: Once | INTRAMUSCULAR | Status: AC
  Administered 2012-08-16: 25 mg via INTRAVENOUS
  Filled 2012-08-16: qty 1

## 2012-08-16 MED ORDER — SENNOSIDES-DOCUSATE SODIUM 8.6-50 MG PO TABS
1.0000 | ORAL_TABLET | Freq: Two times a day (BID) | ORAL | Status: DC
  Administered 2012-08-16 – 2012-08-19 (×6): 1 via ORAL
  Filled 2012-08-16 (×9): qty 1

## 2012-08-16 MED ORDER — SODIUM CHLORIDE 0.9 % IV SOLN
INTRAVENOUS | Status: DC
  Administered 2012-08-16: 160 mL via INTRAVENOUS

## 2012-08-16 MED ORDER — ACETAMINOPHEN 325 MG PO TABS: 650.0000 mg | ORAL_TABLET | ORAL | Status: DC | PRN

## 2012-08-16 NOTE — ED Notes (Signed)
Pts ekg on counter by Dr Denton Lank - placed in pts chart.

## 2012-08-16 NOTE — ED Notes (Signed)
Pt c/o left eye pain and stabbing feeling x 1 week; pt sts went to eye doctor today and sent here for neuro work up for 3rd nerve palsy or aneurysm

## 2012-08-16 NOTE — ED Provider Notes (Signed)
Dr Corliss Skains called to requests NS be called. Discussed pt with Dr Yetta Barre, NS - he reviewed MRA and indicates given ptosis, symptomatic aneurysm, may be more amenable to clipping - he states will admit, and requests ct angio be done to further assess.     Suzi Roots, MD 08/16/12 1840

## 2012-08-16 NOTE — ED Notes (Signed)
Patient transported to CT and xray 

## 2012-08-16 NOTE — ED Provider Notes (Cosign Needed)
History     CSN: [id number]  Arrival date & time 08/16/12  1301   First MD Initiated Contact with Patient 08/16/12 1335      Chief Complaint  Patient presents with  . Eye Pain    (Consider location/radiation/quality/duration/timing/severity/associated sxs/prior treatment) HPI Comments: The patient is a 56 year old woman with pain in her left eye. She's had this intermittently for about 3 weeks. She was seen on 08/01/12 with a severe headache. At that time she had physical exam laboratory tests and CT x-ray the brain all of which were negative. Her blood pressure was elevated and she was prescribed hydrochlorothiazide for hypertension.  She says that she was seen by Dr. Dione Booze, local ophthalmologist, who noted a left eye ptosis and was concerned about possible intracranial aneurysm.  She was therefore sent to [facility] ED for re-evaluation.  Patient is a 56 y.o. female presenting with eye pain. The history is provided by the patient.  Eye Pain Chronicity: Pain behind left eye on and off for 3 weeks. The problem occurs daily. The problem has been gradually worsening. Associated symptoms include headaches. Exacerbated by: Has pain behind left eye, worse with exposure to light. Nothing relieves the symptoms. Treatments tried: Antihypertensives, NSAIDS's without relief.    Past Medical History  Diagnosis Date  . Hypertension     Past Surgical History  Procedure Date  . No past surgeries     History reviewed. No pertinent family history.  History  Substance Use Topics  . Smoking status: Current Everyday Smoker -- 0.2 packs/day    Types: Cigarettes  . Smokeless tobacco: Not on file  . Alcohol Use: Yes    OB History    Grav Para Term Preterm Abortions TAB SAB Ect Mult Living   3 2 2  1 1    2       Review of Systems  Constitutional: Negative for fever and chills.  Eyes: Positive for pain.  Respiratory: Negative.   Cardiovascular: Negative.   Gastrointestinal: Negative.     Genitourinary: Negative.   Musculoskeletal: Negative.   Neurological: Positive for headaches.  Psychiatric/Behavioral: Negative.     Allergies  Review of patient's allergies indicates no known allergies.  Home Medications   Current Outpatient Rx  Name Route Sig Dispense Refill  . HYDROCHLOROTHIAZIDE 25 MG PO TABS Oral Take 1 tablet (25 mg total) by mouth daily. 90 tablet 0    BP 169/77  Pulse 58  Temp 98.4 F (36.9 C) (Oral)  Resp 18  SpO2 99%  Physical Exam  Nursing note and vitals reviewed. Constitutional: She is oriented to person, place, and time. She appears well-developed and well-nourished. Distressed: In moderate distress with pain behind left eye.  HENT:  Head: Normocephalic and atraumatic.  Right Ear: External ear normal.  Left Ear: External ear normal.  Mouth/Throat: Oropharynx is clear and moist.  Eyes: Conjunctivae and EOM are normal. Pupils are equal, round, and reactive to light.  Neck: Normal range of motion. Neck supple.  Cardiovascular: Normal rate and regular rhythm.   Pulmonary/Chest: Effort normal and breath sounds normal.  Abdominal: Soft. Bowel sounds are normal.  Musculoskeletal: Normal range of motion. She exhibits no edema and no tenderness.  Neurological: She is alert and oriented to person, place, and time. Cranial nerve deficit: She has ptosis of the left eye.  EOM are full; PERRLA.       No sensory or motor deficit.   Skin: Skin is warm and dry.  Psychiatric: She has a  normal mood and affect. Her behavior is normal.    ED Course  Procedures (including critical care time)  3:26 PM Pt seen --> physical exam performed.  Call to Augusto Gamble, M.D., radiologist, to discuss what imaging would be most helpful, and we decided to go ahead with MRA of the brain without contrast.  4:09 PM Complaining of severe pain behind left eye, not relieved by po Percocet.  Rx IV Reglan, dexamethasone, and Benadryl.    4:44 PM In MRI.  5:55 PM MRA shows an  intracerebral aneurysm.  Case discussed with Dr. Elie Goody, neuroradiologist.  He will call in Dr. Corliss Skains, interventional radiologist, to see pt.  He advised getting head CT and admitting lab tests.   1. Intracerebral aneurysm        Carleene Cooper III, MD 08/16/12 1800

## 2012-08-16 NOTE — ED Notes (Signed)
Patient transported to CT 

## 2012-08-16 NOTE — ED Notes (Signed)
Pt ambulated to RR with steady gait for CCUS.

## 2012-08-16 NOTE — ED Notes (Signed)
EKG handed to Dr. Denton Lank.  Extra copy placed in pt chart, no old EKG in MUSE

## 2012-08-16 NOTE — ED Notes (Signed)
Patient transported to MRI 

## 2012-08-16 NOTE — H&P (Signed)
Reason for Consult:aneurysm Referring Physician: EDP  AIRIS BARBEE is an 56 y.o. female.   HPI:  56 yo BF with h/o HTN who presented to ED with 2 week history of headache and double vision. She has pain behind her left eye. No sudden onset headache. No nausea vomiting. No numbness tingling or weakness. 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 The ER contacted Dr. Helene Shoe and he recommended admission and coiling of the aneurysm, but suggested that they discuss the case with neurosurgery. Therefore I was contacted regarding my opinion.  Past Medical History  Diagnosis Date  . Hypertension     Past Surgical History  Procedure Date  . No past surgeries     No Known Allergies  History  Substance Use Topics  . Smoking status: Current Everyday Smoker -- 0.2 packs/day    Types: Cigarettes  . Smokeless tobacco: Not on file  . Alcohol Use: Yes    History reviewed. No pertinent family history.   Review of Systems  Positive ROS: Neg  All other systems have been reviewed and were otherwise negative with the exception of those mentioned in the HPI and as above.  Objective: Vital signs in last 24 hours: Temp:  [98.4 F (36.9 C)] 98.4 F (36.9 C) (08/21 1317) Pulse Rate:  [55-58] 55  (08/21 1600) Resp:  [18] 18  (08/21 1317) BP: (157-169)/(77-90) 157/90 mmHg (08/21 1600) SpO2:  [99 %-100 %] 100 % (08/21 1600)  General Appearance: Alert, cooperative, no distress, appears stated age Head: Normocephalic, without obvious abnormality, atraumatic     Throat: benign Neck: Supple, symmetrical, trachea midline Lungs: Clear to auscultation bilaterally, respirations unlabored Heart: Regular rate and rhythm Abdomen: Soft, non-tender, bowel sounds active all four quadrants, no masses, no organomegaly Extremities: Extremities normal, atraumatic, no cyanosis or edema Pulses: 2+ and symmetric all extremities  NEUROLOGIC:   Mental status: A&O x4, no aphasia, good attention  span, Memory and fund of knowledge Motor Exam - grossly normal, normal tone and bulk Sensory Exam - grossly normal Reflexes: symmetric, no pathologic reflexes, No Hoffman's, No clonus Coordination - grossly normal Gait - grossly normal Balance - grossly normal Cranial Nerves: I: smell Not tested  II: visual acuity  OS: good    OD: decreased  II: visual fields  difficult to determine   II: pupils 3 mm round, reactive to light on the right, left pupil mid position and fixed   III,VII: ptosis None on right , ptosis on the left   III,IV,VI: extraocular muscles  Full ROM on right, left eye deviated down and out   V: mastication Normal  V: facial light touch sensation  Normal  V,VII: corneal reflex  Present  VII: facial muscle function - upper  Normal  VII: facial muscle function - lower Normal  VIII: hearing Not tested  IX: soft palate elevation  Normal  IX,X: gag reflex Present  XI: trapezius strength  5/5  XI: sternocleidomastoid strength 5/5  XI: neck flexion strength  5/5  XII: tongue strength  Normal    Data Review Lab Results  Component Value Date   WBC 9.8 08/16/2012   HGB 13.1 08/16/2012   HCT 37.8 08/16/2012   MCV 97.9 08/16/2012   PLT 290 08/16/2012   Lab Results  Component Value Date   NA 140 08/01/2012   K 3.5 08/01/2012   CL 103 08/01/2012   CO2 28 08/01/2012   BUN 10 08/01/2012   CREATININE 0.84 08/01/2012   GLUCOSE 94 08/01/2012  Lab Results  Component Value Date   INR 0.94 08/16/2012    Radiology: No results found. CT scan: Shows no apparent subarachnoid hemorrhage, MRI with MRA reveals a left pcomm aneurysm  Assessment/Plan: This is a 56 year old female with a history of hypertension who has a left pcomm aneurysm that is apparently unruptured but is causing compression of the left third nerve, giving her a left third nerve palsy and headache. I do not believe coiling would be the correct intervention for this as I believe her third nerve palsy would persist. I believe  this needs direct open surgical clipping. Dr. Franky Macho agrees and agrees to take over her care tomorrow and performed the surgical clipping. I have briefly discussed the reasoning for this as well as the risk and benefits of surgical clipping versus coiling. I am sure he will discuss this in more detail tomorrow. We will get a CT angiogram tonight for surgical planning, and ache her n.p.o. after midnight. We'll go ahead and start Decadron tonight.   Caral Whan S 08/16/2012 7:28 PM

## 2012-08-17 ENCOUNTER — Encounter (HOSPITAL_COMMUNITY): Payer: Self-pay | Admitting: *Deleted

## 2012-08-17 ENCOUNTER — Inpatient Hospital Stay (HOSPITAL_COMMUNITY): Payer: MEDICAID | Admitting: *Deleted

## 2012-08-17 ENCOUNTER — Encounter (HOSPITAL_COMMUNITY)
Admission: EM | Disposition: A | Discharge status: Home / Self Care | Payer: Self-pay | Source: Home / Self Care | Attending: Neurosurgery

## 2012-08-17 ENCOUNTER — Inpatient Hospital Stay: Admit: 2012-08-17 | Payer: Self-pay | Admitting: Neurosurgery

## 2012-08-17 HISTORY — PX: CRANIOTOMY: SHX93

## 2012-08-17 LAB — CBC
HCT: 35.1 % — ABNORMAL LOW (ref 36.0–46.0)
MCH: 34.6 pg — ABNORMAL HIGH (ref 26.0–34.0)
MCV: 98.6 fL (ref 78.0–100.0)
RBC: 3.56 MIL/uL — ABNORMAL LOW (ref 3.87–5.11)
WBC: 10.9 10*3/uL — ABNORMAL HIGH (ref 4.0–10.5)

## 2012-08-17 LAB — COMPREHENSIVE METABOLIC PANEL
BUN: 7 mg/dL (ref 6–23)
CO2: 28 mEq/L (ref 19–32)
Chloride: 103 mEq/L (ref 96–112)
Creatinine, Ser: 0.68 mg/dL (ref 0.50–1.10)
GFR calc Af Amer: >90 mL/min (ref >90)
GFR calc non Af Amer: >90 mL/min (ref >90)
Total Bilirubin: 0.3 mg/dL (ref 0.3–1.2)

## 2012-08-17 SURGERY — CRANIOTOMY INTRACRANIAL ANEURYSM FOR CAROTID
Anesthesia: General | Site: Head | Laterality: Left | Wound class: Clean

## 2012-08-17 MED ORDER — DEXAMETHASONE SODIUM PHOSPHATE 10 MG/ML IJ SOLN
INTRAMUSCULAR | Status: DC | PRN
  Administered 2012-08-17: 10 mg via INTRAVENOUS

## 2012-08-17 MED ORDER — MICROFIBRILLAR COLL HEMOSTAT EX PADS
MEDICATED_PAD | CUTANEOUS | Status: DC | PRN
  Administered 2012-08-17: 1 via TOPICAL

## 2012-08-17 MED ORDER — LIDOCAINE HCL 4 % MT SOLN
OROMUCOSAL | Status: DC | PRN
  Administered 2012-08-17: 4 mL via TOPICAL

## 2012-08-17 MED ORDER — NITROGLYCERIN 0.2 MG/ML ON CALL CATH LAB
INTRAVENOUS | Status: DC | PRN
  Administered 2012-08-17 (×2): 20 ug via INTRAVENOUS

## 2012-08-17 MED ORDER — MANNITOL 20 % IV SOLN
INTRAVENOUS | Status: DC | PRN
  Administered 2012-08-17: 16:00:00 via INTRAVENOUS

## 2012-08-17 MED ORDER — OXYCODONE-ACETAMINOPHEN 5-325 MG PO TABS
1.0000 | ORAL_TABLET | ORAL | Status: DC | PRN
  Administered 2012-08-18 – 2012-08-19 (×3): 1 via ORAL
  Filled 2012-08-17 (×3): qty 1

## 2012-08-17 MED ORDER — NEOSTIGMINE METHYLSULFATE 1 MG/ML IJ SOLN
INTRAMUSCULAR | Status: DC | PRN
  Administered 2012-08-17: 3 mg via INTRAVENOUS

## 2012-08-17 MED ORDER — LIDOCAINE-EPINEPHRINE 0.5 %-1:200000 IJ SOLN
INTRAMUSCULAR | Status: DC | PRN
  Administered 2012-08-17: 10 mL

## 2012-08-17 MED ORDER — FENTANYL CITRATE 0.05 MG/ML IJ SOLN
INTRAMUSCULAR | Status: DC | PRN
  Administered 2012-08-17: 50 ug via INTRAVENOUS
  Administered 2012-08-17: 100 ug via INTRAVENOUS
  Administered 2012-08-17: 50 ug via INTRAVENOUS
  Administered 2012-08-17: 100 ug via INTRAVENOUS
  Administered 2012-08-17: 50 ug via INTRAVENOUS
  Administered 2012-08-17: 150 ug via INTRAVENOUS

## 2012-08-17 MED ORDER — ONDANSETRON HCL 4 MG/2ML IJ SOLN
INTRAMUSCULAR | Status: DC | PRN
  Administered 2012-08-17: 4 mg via INTRAVENOUS

## 2012-08-17 MED ORDER — SODIUM CHLORIDE 0.9 % IV SOLN
INTRAVENOUS | Status: DC | PRN
  Administered 2012-08-17 (×3): via INTRAVENOUS

## 2012-08-17 MED ORDER — GLYCOPYRROLATE 0.2 MG/ML IJ SOLN
INTRAMUSCULAR | Status: DC | PRN
  Administered 2012-08-17: .4 mg via INTRAVENOUS

## 2012-08-17 MED ORDER — HYDRALAZINE HCL 20 MG/ML IJ SOLN
INTRAMUSCULAR | Status: DC | PRN
  Administered 2012-08-17: 5 mg via INTRAVENOUS

## 2012-08-17 MED ORDER — ETOMIDATE 2 MG/ML IV SOLN
INTRAVENOUS | Status: DC | PRN
  Administered 2012-08-17: 24 mg via INTRAVENOUS

## 2012-08-17 MED ORDER — ROCURONIUM BROMIDE 100 MG/10ML IV SOLN
INTRAVENOUS | Status: DC | PRN
  Administered 2012-08-17: 50 mg via INTRAVENOUS
  Administered 2012-08-17: 30 mg via INTRAVENOUS
  Administered 2012-08-17: 50 mg via INTRAVENOUS

## 2012-08-17 MED ORDER — DEXAMETHASONE SODIUM PHOSPHATE 4 MG/ML IJ SOLN
4.0000 mg | Freq: Four times a day (QID) | INTRAMUSCULAR | Status: DC
  Administered 2012-08-17 – 2012-08-20 (×10): 4 mg via INTRAVENOUS
  Filled 2012-08-17 (×13): qty 1

## 2012-08-17 MED ORDER — ONDANSETRON HCL 4 MG/2ML IJ SOLN: 4.0000 mg | Freq: Once | INTRAMUSCULAR | Status: DC | PRN

## 2012-08-17 MED ORDER — 0.9 % SODIUM CHLORIDE (POUR BTL) OPTIME
TOPICAL | Status: DC | PRN
  Administered 2012-08-17 (×3): 1000 mL

## 2012-08-17 MED ORDER — HYDROCODONE-ACETAMINOPHEN 5-325 MG PO TABS: 1.0000 | ORAL_TABLET | ORAL | Status: DC | PRN

## 2012-08-17 MED ORDER — MORPHINE SULFATE 2 MG/ML IJ SOLN
2.0000 mg | INTRAMUSCULAR | Status: DC | PRN
  Administered 2012-08-17 – 2012-08-20 (×7): 2 mg via INTRAVENOUS
  Filled 2012-08-17 (×7): qty 1

## 2012-08-17 MED ORDER — LIDOCAINE HCL (CARDIAC) 20 MG/ML IV SOLN
INTRAVENOUS | Status: DC | PRN
  Administered 2012-08-17: 100 mg via INTRAVENOUS

## 2012-08-17 MED ORDER — EPHEDRINE SULFATE 50 MG/ML IJ SOLN
INTRAMUSCULAR | Status: DC | PRN
  Administered 2012-08-17: 5 mg via INTRAVENOUS

## 2012-08-17 MED ORDER — SODIUM CHLORIDE 0.9 % IV SOLN
INTRAVENOUS | Status: DC
  Administered 2012-08-17: 75 mL/h via INTRAVENOUS
  Administered 2012-08-18: 16:00:00 via INTRAVENOUS

## 2012-08-17 MED ORDER — BACITRACIN ZINC 500 UNIT/GM EX OINT
TOPICAL_OINTMENT | CUTANEOUS | Status: DC | PRN
  Administered 2012-08-17 (×2): 1 via TOPICAL

## 2012-08-17 MED ORDER — CEFAZOLIN SODIUM 1-5 GM-% IV SOLN
INTRAVENOUS | Status: AC
  Filled 2012-08-17: qty 50

## 2012-08-17 MED ORDER — PROPOFOL 10 MG/ML IV EMUL
INTRAVENOUS | Status: DC | PRN
  Administered 2012-08-17: 50 mg via INTRAVENOUS
  Administered 2012-08-17: 200 mg via INTRAVENOUS
  Administered 2012-08-17: 100 mg via INTRAVENOUS
  Administered 2012-08-17: 30 mg via INTRAVENOUS
  Administered 2012-08-17 (×2): 50 mg via INTRAVENOUS

## 2012-08-17 MED ORDER — THROMBIN 20000 UNITS EX SOLR
CUTANEOUS | Status: DC | PRN
  Administered 2012-08-17: 16:00:00 via TOPICAL

## 2012-08-17 MED ORDER — CEFAZOLIN SODIUM-DEXTROSE 2-3 GM-% IV SOLR
INTRAVENOUS | Status: DC | PRN
  Administered 2012-08-17: 2 g via INTRAVENOUS

## 2012-08-17 MED ORDER — HYDROMORPHONE HCL PF 1 MG/ML IJ SOLN: 0.2500 mg | INTRAMUSCULAR | Status: DC | PRN

## 2012-08-17 MED ORDER — LABETALOL HCL 5 MG/ML IV SOLN
INTRAVENOUS | Status: DC | PRN
  Administered 2012-08-17: 2.5 mg via INTRAVENOUS
  Administered 2012-08-17: 10 mg via INTRAVENOUS
  Administered 2012-08-17: 2.5 mg via INTRAVENOUS
  Administered 2012-08-17: 10 mg via INTRAVENOUS
  Administered 2012-08-17: 5 mg via INTRAVENOUS

## 2012-08-17 SURGICAL SUPPLY — 110 items
AM-24--S SIDECUTTING ARACHNOID KNIFE ×2 IMPLANT
ANEURYSM CLIP, TEMPORARY ×4 IMPLANT
BANDAGE GAUZE 4  KLING STR (GAUZE/BANDAGES/DRESSINGS) ×2 IMPLANT
BANDAGE GAUZE ELAST BULKY 4 IN (GAUZE/BANDAGES/DRESSINGS) ×4 IMPLANT
BENZOIN TINCTURE PRP APPL 2/3 (GAUZE/BANDAGES/DRESSINGS) IMPLANT
BIT DRILL WIRE PASS 1.3MM (BIT) IMPLANT
BLADE EYE SICKLE 84 5 BEAV (BLADE) ×2 IMPLANT
BLADE SAW GIGLI 16 STRL (MISCELLANEOUS) IMPLANT
BLADE ULTRA TIP 2M (BLADE) IMPLANT
BRUSH SCRUB EZ 1% IODOPHOR (MISCELLANEOUS) IMPLANT
BRUSH SCRUB EZ PLAIN DRY (MISCELLANEOUS) ×2 IMPLANT
BUR ACORN 6.0 PRECISION (BURR) ×2 IMPLANT
BUR ADDG 1.1 (BURR) IMPLANT
BUR MATCHSTICK NEURO 3.0 LAGG (BURR) ×4 IMPLANT
BUR ROUTER D-58 CRANI (BURR) ×2 IMPLANT
CANISTER SUCTION 2500CC (MISCELLANEOUS) ×2 IMPLANT
CATH FOLEY 2WAY SLVR  5CC 12FR (CATHETERS) ×1
CATH FOLEY 2WAY SLVR  5CC 14FR (CATHETERS) ×2
CATH FOLEY 2WAY SLVR 5CC 12FR (CATHETERS) ×1 IMPLANT
CATH FOLEY 2WAY SLVR 5CC 14FR (CATHETERS) ×2 IMPLANT
CLIP ANEURY TI TEMP STD STR 9M (Clip) ×2 IMPLANT
CLIP TI MEDIUM 6 (CLIP) IMPLANT
CLOTH BEACON ORANGE TIMEOUT ST (SAFETY) ×2 IMPLANT
CONT SPEC 4OZ CLIKSEAL STRL BL (MISCELLANEOUS) ×2 IMPLANT
CORDS BIPOLAR (ELECTRODE) ×2 IMPLANT
Clip Aneury TI Perm STD STR 7mm ×2 IMPLANT
DECANTER SPIKE VIAL GLASS SM (MISCELLANEOUS) ×2 IMPLANT
DRAIN SNY WOU 7FLT (WOUND CARE) IMPLANT
DRAPE MICROSCOPE LEICA (MISCELLANEOUS) ×2 IMPLANT
DRAPE NEUROLOGICAL W/INCISE (DRAPES) ×2 IMPLANT
DRAPE WARM FLUID 44X44 (DRAPE) ×2 IMPLANT
DRESSING TELFA 8X3 (GAUZE/BANDAGES/DRESSINGS) IMPLANT
DRILL WIRE PASS 1.3MM (BIT)
DURAGUARD 04CMX04CM ×2 IMPLANT
DURAPREP 6ML APPLICATOR 50/CS (WOUND CARE) ×2 IMPLANT
ELECT CAUTERY BLADE 6.4 (BLADE) IMPLANT
ELECT REM PT RETURN 9FT ADLT (ELECTROSURGICAL) ×2
ELECTRODE REM PT RTRN 9FT ADLT (ELECTROSURGICAL) ×1 IMPLANT
EVACUATOR SILICONE 100CC (DRAIN) IMPLANT
GAUZE SPONGE 4X4 16PLY XRAY LF (GAUZE/BANDAGES/DRESSINGS) IMPLANT
GLOVE BIO SURGEON STRL SZ 6.5 (GLOVE) ×6 IMPLANT
GLOVE BIO SURGEON STRL SZ7 (GLOVE) IMPLANT
GLOVE BIO SURGEON STRL SZ7.5 (GLOVE) IMPLANT
GLOVE BIO SURGEON STRL SZ8 (GLOVE) IMPLANT
GLOVE BIO SURGEON STRL SZ8.5 (GLOVE) IMPLANT
GLOVE BIOGEL M 8.0 STRL (GLOVE) IMPLANT
GLOVE BIOGEL PI IND STRL 6.5 (GLOVE) ×1 IMPLANT
GLOVE BIOGEL PI IND STRL 7.5 (GLOVE) ×2 IMPLANT
GLOVE BIOGEL PI IND STRL 8 (GLOVE) ×3 IMPLANT
GLOVE BIOGEL PI INDICATOR 6.5 (GLOVE) ×1
GLOVE BIOGEL PI INDICATOR 7.5 (GLOVE) ×2
GLOVE BIOGEL PI INDICATOR 8 (GLOVE) ×3
GLOVE ECLIPSE 6.5 STRL STRAW (GLOVE) ×2 IMPLANT
GLOVE ECLIPSE 7.0 STRL STRAW (GLOVE) IMPLANT
GLOVE ECLIPSE 7.5 STRL STRAW (GLOVE) ×4 IMPLANT
GLOVE ECLIPSE 8.0 STRL XLNG CF (GLOVE) IMPLANT
GLOVE ECLIPSE 8.5 STRL (GLOVE) IMPLANT
GLOVE EXAM NITRILE LRG STRL (GLOVE) IMPLANT
GLOVE EXAM NITRILE MD LF STRL (GLOVE) ×8 IMPLANT
GLOVE EXAM NITRILE XL STR (GLOVE) IMPLANT
GLOVE EXAM NITRILE XS STR PU (GLOVE) IMPLANT
GLOVE INDICATOR 6.5 STRL GRN (GLOVE) IMPLANT
GLOVE INDICATOR 7.0 STRL GRN (GLOVE) IMPLANT
GLOVE INDICATOR 7.5 STRL GRN (GLOVE) IMPLANT
GLOVE INDICATOR 8.0 STRL GRN (GLOVE) IMPLANT
GLOVE INDICATOR 8.5 STRL (GLOVE) IMPLANT
GLOVE OPTIFIT SS 8.0 STRL (GLOVE) IMPLANT
GLOVE SURG SS PI 6.5 STRL IVOR (GLOVE) IMPLANT
GOWN BRE IMP SLV AUR LG STRL (GOWN DISPOSABLE) ×10 IMPLANT
GOWN BRE IMP SLV AUR XL STRL (GOWN DISPOSABLE) IMPLANT
GOWN STRL REIN 2XL LVL4 (GOWN DISPOSABLE) ×2 IMPLANT
HEMOSTAT SURGICEL 2X14 (HEMOSTASIS) ×2 IMPLANT
HOOK DURA (MISCELLANEOUS) ×2 IMPLANT
KIT BASIN OR (CUSTOM PROCEDURE TRAY) ×2 IMPLANT
KIT DRAIN CSF ACCUDRAIN (MISCELLANEOUS) IMPLANT
KIT ROOM TURNOVER OR (KITS) ×2 IMPLANT
MARKER SKIN DUAL TIP RULER LAB (MISCELLANEOUS) ×2 IMPLANT
NEEDLE HYPO 25X1 1.5 SAFETY (NEEDLE) ×2 IMPLANT
NS IRRIG 1000ML POUR BTL (IV SOLUTION) ×6 IMPLANT
PACK CRANIOTOMY (CUSTOM PROCEDURE TRAY) ×2 IMPLANT
PAD ARMBOARD 7.5X6 YLW CONV (MISCELLANEOUS) ×2 IMPLANT
PATTIES SURGICAL .25X.25 (GAUZE/BANDAGES/DRESSINGS) IMPLANT
PATTIES SURGICAL .5 X.5 (GAUZE/BANDAGES/DRESSINGS) IMPLANT
PATTIES SURGICAL .5 X3 (DISPOSABLE) IMPLANT
PATTIES SURGICAL 1/4 X 3 (GAUZE/BANDAGES/DRESSINGS) IMPLANT
PATTIES SURGICAL 1X1 (DISPOSABLE) IMPLANT
PIN MAYFIELD SKULL DISP (PIN) ×2 IMPLANT
PLATE 1.5  2HOLE LNG NEURO (Plate) ×2 IMPLANT
PLATE 1.5  2HOLE MED NEURO (Plate) ×2 IMPLANT
PLATE 1.5 2HOLE LNG NEURO (Plate) ×2 IMPLANT
PLATE 1.5 2HOLE MED NEURO (Plate) ×2 IMPLANT
RUBBERBAND STERILE (MISCELLANEOUS) ×4 IMPLANT
SCREW SELF DRILL HT 1.5/4MM (Screw) ×16 IMPLANT
SPONGE GAUZE 4X4 12PLY (GAUZE/BANDAGES/DRESSINGS) ×2 IMPLANT
SPONGE NEURO XRAY DETECT 1X3 (DISPOSABLE) IMPLANT
SPONGE SURGIFOAM ABS GEL 100 (HEMOSTASIS) IMPLANT
SPONGE SURGIFOAM ABS GEL 100C (HEMOSTASIS) ×2 IMPLANT
STAPLER SKIN PROX WIDE 3.9 (STAPLE) ×2 IMPLANT
SUT ETHILON 3 0 FSL (SUTURE) IMPLANT
SUT NURALON 4 0 TR CR/8 (SUTURE) ×6 IMPLANT
SUT VIC AB 2-0 CT2 18 VCP726D (SUTURE) ×6 IMPLANT
SUT VIC AB 3-0 SH 8-18 (SUTURE) ×2 IMPLANT
SYR 20ML ECCENTRIC (SYRINGE) ×2 IMPLANT
SYR CONTROL 10ML LL (SYRINGE) ×2 IMPLANT
TOWEL OR 17X24 6PK STRL BLUE (TOWEL DISPOSABLE) ×2 IMPLANT
TOWEL OR 17X26 10 PK STRL BLUE (TOWEL DISPOSABLE) ×2 IMPLANT
TRAY FOLEY CATH 14FRSI W/METER (CATHETERS) ×2 IMPLANT
TUBE CONNECTING 12X1/4 (SUCTIONS) ×2 IMPLANT
UNDERPAD 30X30 INCONTINENT (UNDERPADS AND DIAPERS) IMPLANT
WATER STERILE IRR 1000ML POUR (IV SOLUTION) ×2 IMPLANT

## 2012-08-17 NOTE — Preoperative (Signed)
Beta Blockers   Reason not to administer Beta Blockers:Not Applicable 

## 2012-08-17 NOTE — Progress Notes (Signed)
INITIAL ADULT NUTRITION ASSESSMENT Date: 08/17/2012   Time: 10:23 AM Reason for Assessment: MST score 2  Pt meets criteria for severe malnutrition in the context of acute illness as evidenced by 12% weight loss x 2 weeks and intake of </= 50% of her estimated needs for >/= 5 days.  INTERVENTION: Once diet advanced offer Ensure Complete po BID, each supplement provides 350 kcal and 13 grams of protein.  ASSESSMENT: Female 56 y.o.  Dx: Headache and double vision  Hx:  Past Medical History  Diagnosis Date  . Hypertension    Past Surgical History  Procedure Date  . No past surgeries    Related Meds:     . dexamethasone  10 mg Intravenous Once  . dexamethasone  4 mg Intravenous Q6H  . diphenhydrAMINE  25 mg Intravenous Once  . hydrochlorothiazide  25 mg Oral Daily  . levetiracetam  500 mg Intravenous Q12H  . metoCLOPramide (REGLAN) injection  10 mg Intravenous Once  . oxyCODONE-acetaminophen  1 tablet Oral Once  . pantoprazole (PROTONIX) IV  40 mg Intravenous QHS  . senna-docusate  1 tablet Oral BID   Ht: 5\' 3"  (160 cm)  Wt: 132 lb 15 oz (60.3 kg)  Ideal Wt: 52.2 kg % Ideal Wt: 116%  Usual Wt:  Wt Readings from Last 10 Encounters:  08/16/12 132 lb 15 oz (60.3 kg)  08/16/12 132 lb 15 oz (60.3 kg)  12/07/11 150 lb (68.04 kg)   % Usual Wt: 88%  Body mass index is 23.55 kg/(m^2). WNL  Food/Nutrition Related Hx:  Pt reports a 12% weight loss over the last 2-3 weeks due to pain. She reports that she has not been eating Breakfast or Lunch for the last 2-3 weeks and just some soup and crackers for supper. Pt did report that when they got her pain under control last night she felt hungry.   Labs:  CMP     Component Value Date/Time   NA 140 08/17/2012 0405   K 3.8 08/17/2012 0405   CL 103 08/17/2012 0405   CO2 28 08/17/2012 0405   GLUCOSE 119* 08/17/2012 0405   BUN 7 08/17/2012 0405   CREATININE 0.68 08/17/2012 0405   CALCIUM 10.1 08/17/2012 0405   PROT 7.1 08/17/2012 0405    ALBUMIN 3.7 08/17/2012 0405   AST 22 08/17/2012 0405   ALT 18 08/17/2012 0405   ALKPHOS 59 08/17/2012 0405   BILITOT 0.3 08/17/2012 0405   GFRNONAA >90 08/17/2012 0405   GFRAA >90 08/17/2012 0405   Sodium  Date/Time Value Range Status  08/17/2012  4:05 AM 140  135 - 145 mEq/L Final  08/16/2012  6:34 PM 139  135 - 145 mEq/L Final  08/01/2012  6:10 PM 140  135 - 145 mEq/L Final    Potassium  Date/Time Value Range Status  08/17/2012  4:05 AM 3.8  3.5 - 5.1 mEq/L Final  08/16/2012  6:34 PM 3.4* 3.5 - 5.1 mEq/L Final  08/01/2012  6:10 PM 3.5  3.5 - 5.1 mEq/L Final    No results found for this basename: phos    No results found for this basename: mg    Intake/Output Summary (Last 24 hours) at 08/17/12 1028 Last data filed at 08/17/12 1000  Gross per 24 hour  Intake  752.5 ml  Output    350 ml  Net  402.5 ml    Diet Order: NPO  Supplements/Tube Feeding: none  IVF:    sodium chloride Last Rate: 50 mL/hr at  08/17/12 1000  DISCONTD: sodium chloride Last Rate: 160 mL (08/16/12 1617)   Pt admitted with a left aneurysm which is causing compression of the left third nerve giving her a left third nerve palsy and headache. She is currently NPO for planned surgical clipping this afternoon.   Estimated Nutritional Needs:   Kcal:  1500-1700 Protein:  65-75 grams Fluid:  >1.8 L/day  NUTRITION DIAGNOSIS: -Malnutrition (NI-5.2).  Status: Ongoing  RELATED TO: pain  AS EVIDENCE BY: 12% weight loss and intake of </= 50% of her estimated needs for >/= 5 days.  MONITORING/EVALUATION(Goals): Goal: Pt to meet >/= 90% of their estimated nutrition needs. Monitor: PO intake, weight  EDUCATION NEEDS: -No education needs identified at this time  DOCUMENTATION CODES Per approved criteria  -Severe malnutrition in the context of acute illness or injury    Kendell Bane RD, LDN, CNSC [phone] Pager [phone] After Hours Pager  08/17/2012, 10:23 AM

## 2012-08-17 NOTE — Anesthesia Procedure Notes (Signed)
Procedure Name: Intubation Date/Time: 08/17/2012 3:32 PM Performed by: Glendora Score A Pre-anesthesia Checklist: Patient identified, Emergency Drugs available, Suction available and Patient being monitored Patient Re-evaluated:Patient Re-evaluated prior to inductionOxygen Delivery Method: Circle system utilized Preoxygenation: Pre-oxygenation with 100% oxygen Intubation Type: IV induction Ventilation: Mask ventilation without difficulty and Oral airway inserted - appropriate to patient size Laryngoscope Size: Hyacinth Meeker and 2 Grade View: Grade I Tube type: Oral Tube size: 7.5 mm Number of attempts: 1 Airway Equipment and Method: Stylet and LTA kit utilized Placement Confirmation: ETT inserted through vocal cords under direct vision,  positive ETCO2 and breath sounds checked- equal and bilateral Secured at: 22 cm Tube secured with: Tape Dental Injury: Teeth and Oropharynx as per pre-operative assessment

## 2012-08-17 NOTE — Progress Notes (Signed)
Returned from PACU to room 3106

## 2012-08-17 NOTE — Op Note (Signed)
08/16/2012 - 08/17/2012  8:02 PM  PATIENT:  Darlene Mcconnell  56 y.o. female presented with a 3rd nerve palsy due to an unruptured posterior communicating artery aneurysm. I recommended that she undergo surgical clipping of the aneurysm  PRE-OPERATIVE DIAGNOSIS:  Aneurysm, left posterior communicating artery  POST-OPERATIVE DIAGNOSIS:  Aneurysm, left posterior communicating artery  PROCEDURE:  Procedure(s): CRANIOTOMY INTRACRANIAL ANEURYSM FOR CAROTID Left pterional microdissection SURGEON:  Surgeon(s): Carmela Hurt, MD Hewitt Shorts, MD  ASSISTANTS:robert nudelman  ANESTHESIA:   general  EBL:     BLOOD ADMINISTERED:none  CELL SAVER GIVEN:none  COUNT:per nursing  DRAINS: none   SPECIMEN:  No Specimen  DICTATION: Darlene Mcconnell was brought to the operating room. She was intubated and placed under a general anesthetic without difficulty. A foley catheter was placed under sterile conditions. Anesthesia placed an arterial catheter in the left radial artery. She was positioned on the bed after I applied a 3pin mayfield headholder to 60lbs of pressure. I positioned her head with her malar eminence on the left side as the highest point. Her head was rotated ~ 30degrees to the right. Her head was shaved, prepped and draped. I infiltrated 10cc lidocaine into my incision line. I made my incision with a 10 blade starting anterior to the left ear. I carried the incision in a curvilinear fashion behind the hairline on the left side. I then reflected the scalp and temporalis muscle with an interfascial flap in the style of [place]. I exposed the pterion and placed a burr hole with the drill. I placed another burr hole in the temporal bone. I turned the bone flap with the craniotome without difficulty. I used the drill to flatten my approach to the lesser wing of the sphenoid, and removed more bone. I opened the dura and retracted it anteriorly exposing the temporal and frontal lobes.   I  opened the sylvian fissure with an arachnoid knife then started to dissect with my bipolar tips and microdissection I exposed the middle cerebral artery in the fissure. I retracted the frontal lobe to expose the optic nerve and carotid artery. I used sharp and blunt dissection to open the various basal cisterns to release csf and to aid in brain relaxation. With Dr. Earl Gala  Assistance we completed the dissection of the sylvian fissure posing the posterior communicating artery and the aneurysm active around the base of the aneurysm the proximal portion of the internal carotid artery anesthesiologist given etomidate in a dose of 0.4 mg per kilogram then I placed a temporary clip across the proximal portion of the internal carotid artery on the left side. To dissect around the aneurysm and a protruding into the third nerve and the aneurysm did rupture but I was suctioned clip across the the temporary clip from the carotid artery. We had a clipping time of approximately 5 minutes. I inspected the aneurysm and I encompassed the entire neck with the straight clip. The internal carotid artery, posterior communicating artery, and anterior choroidal artery were all patent. I irrigated the brain and, and there were no bleeding points that I was able to appreciate. I then closed the craniotomy. I approximated the dura, and did place a small graft. I replaced the bone flap with plates and screws. I approximated the temporalis muscle and fascia with vicryl sutures. I approximated the galeal layer and scalp. I placed a sterile dressing. Darlene Mcconnell was extubated in the operating room. She was taken to the PACU in good condition PLAN OF  CARE: Admit to inpatient   PATIENT DISPOSITION:  PACU - hemodynamically stable.   Delay start of Pharmacological VTE agent (>24hrs) due to surgical blood loss or risk of bleeding:  yes

## 2012-08-17 NOTE — Progress Notes (Signed)
Patient ID: Darlene Mcconnell, female   DOB: 1956-06-10, 56 y.o.   MRN: [id number] BP 152/83  Pulse 60  Temp 98.5 F (36.9 C) (Oral)  Resp 14  Ht 5\' 3"  (1.6 m)  Wt 60.3 kg (132 lb 15 oz)  BMI 23.55 kg/m2  SpO2 99% Darlene Mcconnell is alert and oriented x 4. Speech is clear and fluent. Left Ptosis, non reactive enlarged pupil Symmetric fAcies Tongue and uvula is midline Moving all extremities well, no drift 5/5 strength in all extremities Risks and benefits of craniotomy for elective aneurysm clipping of this posterior communicating artery aneurysm including but not limited to bleeding, infection, stroke, coma, death, paralysis, weakness, difficulty with speech, personality chAnge, memory problems, aphasia, inability to clip aneurysm were discussed. We discussed endovascular treatment and I feel that surgical clipping is a better first attempt at treatment given the dynamic change in the aneurysm. She appeared to understand and wishes to proceed.

## 2012-08-17 NOTE — Anesthesia Postprocedure Evaluation (Signed)
Anesthesia Post Note  Patient: Darlene Mcconnell  Procedure(s) Performed: Procedure(s) (LRB): CRANIOTOMY INTRACRANIAL ANEURYSM FOR CAROTID (Left)  Anesthesia type: general  Patient location: PACU  Post pain: Pain level controlled  Post assessment: Patient's Cardiovascular Status Stable  Last Vitals:  Filed Vitals:   08/17/12 2030  BP: 120/50  Pulse: 58  Temp: 36.8 C  Resp: 17    Post vital signs: Reviewed and stable  Level of consciousness: sedated  Complications: No apparent anesthesia complications

## 2012-08-17 NOTE — Transfer of Care (Signed)
Immediate Anesthesia Transfer of Care Note  Patient: Darlene Mcconnell  Procedure(s) Performed: Procedure(s) (LRB): CRANIOTOMY INTRACRANIAL ANEURYSM FOR CAROTID (Left)  Patient Location: PACU  Anesthesia Type: General  Level of Consciousness: awake and patient cooperative  Airway & Oxygen Therapy: Patient Spontanous Breathing and Patient connected to face mask oxygen  Post-op Assessment: Report given to PACU RN  Post vital signs: Reviewed and stable  Complications: No apparent anesthesia complications

## 2012-08-17 NOTE — Anesthesia Preprocedure Evaluation (Signed)
Anesthesia Evaluation  Patient identified by MRN, date of birth, ID band Patient awake    Reviewed: Allergy & Precautions, H&P , NPO status , Patient's Chart, lab work & pertinent test results  Airway Mallampati: I TM Distance: >3 FB Neck ROM: full    Dental   Pulmonary          Cardiovascular hypertension, Rhythm:regular Rate:Normal     Neuro/Psych    GI/Hepatic   Endo/Other    Renal/GU      Musculoskeletal   Abdominal   Peds  Hematology   Anesthesia Other Findings Hx of drug abuse.  Reproductive/Obstetrics                           Anesthesia Physical Anesthesia Plan  ASA: III  Anesthesia Plan: General   Post-op Pain Management:    Induction: Intravenous  Airway Management Planned: Oral ETT  Additional Equipment: Arterial line  Intra-op Plan:   Post-operative Plan: Possible Post-op intubation/ventilation  Informed Consent: I have reviewed the patients History and Physical, chart, labs and discussed the procedure including the risks, benefits and alternatives for the proposed anesthesia with the patient or authorized representative who has indicated his/her understanding and acceptance.     Plan Discussed with: CRNA, Anesthesiologist and Surgeon  Anesthesia Plan Comments:         Anesthesia Quick Evaluation

## 2012-08-18 ENCOUNTER — Encounter (HOSPITAL_COMMUNITY): Payer: Self-pay | Admitting: Neurosurgery

## 2012-08-18 NOTE — Progress Notes (Signed)
Patient ID: Darlene Mcconnell, female   DOB: [birth year], 56 y.o.   MRN: [id number] BP 140/70  Pulse 94  Temp 97.9 F (36.6 C) (Axillary)  Resp 19  Ht 5\' 3"  (1.6 m)  Wt 60.3 kg (132 lb 15 oz)  BMI 23.55 kg/m2  SpO2 100% Alert and oriented x 4, speech clear and fluent Symmetric facial sensation Moving all extremities well Dressing dry and intact. Doing quite well, will transfer to floor tomorrow.

## 2012-08-19 MED ORDER — LEVETIRACETAM 500 MG PO TABS
500.0000 mg | ORAL_TABLET | Freq: Two times a day (BID) | ORAL | Status: DC
  Administered 2012-08-19 – 2012-08-20 (×3): 500 mg via ORAL
  Filled 2012-08-19 (×4): qty 1

## 2012-08-19 MED ORDER — PANTOPRAZOLE SODIUM 40 MG PO TBEC
40.0000 mg | DELAYED_RELEASE_TABLET | Freq: Every day | ORAL | Status: DC
  Administered 2012-08-19: 40 mg via ORAL
  Filled 2012-08-19: qty 1

## 2012-08-19 NOTE — Progress Notes (Signed)
PHARMACIST - PHYSICIAN COMMUNICATION  CONCERNING: Protonix and Keppra IV to Oral Route Change Policy  RECOMMENDATION: This patient is receiving Protonix and Keppra by the intravenous route.  Based on criteria approved by the Pharmacy and Therapeutics Committee, this is being converted to the equivalent oral dose form(s).   DESCRIPTION: These criteria include:  The patient is not neutropenic and does not exhibit a GI malabsorption state. Did not have GIB or seizures this admit.  The patient is eating (either orally or via tube) and/or has been taking other orally administered medications for a least 24 hours  If you have questions about this conversion, please contact the Pharmacy Department  Valerya Maxton K. Allena Katz, PharmD, BCPS.  Clinical Pharmacist Pager [phone]. 08/19/2012 7:49 AM

## 2012-08-19 NOTE — Progress Notes (Signed)
Patient ID: Darlene Mcconnell, female   DOB: September 13, 1956, 56 y.o.   MRN: [id number] BP 129/67  Pulse 62  Temp 98.6 F (37 C) (Oral)  Resp 17  Ht 5\' 3"  (1.6 m)  Wt 60.3 kg (132 lb 15 oz)  BMI 23.55 kg/m2  SpO2 98% Alert and oriented x 4, speech clear and fluent Perrl, full eom Symmetric facies, tongue and uvula midline No drift Moving all extremities well Dressing dry and intact. Periorbital edema on left decreased Will transfer today

## 2012-08-20 LAB — TYPE AND SCREEN
ABO/RH(D): A POS
Antibody Screen: NEGATIVE
Unit division: 0

## 2012-08-20 MED ORDER — HYDROCODONE-ACETAMINOPHEN 5-325 MG PO TABS: 1.0000 | ORAL_TABLET | Freq: Four times a day (QID) | ORAL | Status: AC | PRN

## 2012-08-20 MED ORDER — LEVETIRACETAM 500 MG PO TABS: 500.0000 mg | ORAL_TABLET | Freq: Two times a day (BID) | ORAL | Status: DC

## 2012-08-20 MED ORDER — HYDROCODONE-ACETAMINOPHEN 5-325 MG PO TABS: 1.0000 | ORAL_TABLET | ORAL | Status: DC | PRN

## 2012-08-20 MED ORDER — HYDROCHLOROTHIAZIDE 25 MG PO TABS: 25.0000 mg | ORAL_TABLET | Freq: Two times a day (BID) | ORAL | Status: DC

## 2012-08-20 MED ORDER — LISINOPRIL 10 MG PO TABS: 10.0000 mg | ORAL_TABLET | Freq: Every day | ORAL | Status: DC

## 2012-08-20 MED ORDER — HYDROCHLOROTHIAZIDE 25 MG PO TABS: 25.0000 mg | ORAL_TABLET | Freq: Every day | ORAL | Status: DC

## 2012-08-20 NOTE — Discharge Summary (Signed)
Physician Discharge Summary  Patient ID: Darlene Mcconnell MRN: [id number] DOB/AGE: [birth year] 54 y.o.  Admit date: 08/16/2012 Discharge date: 08/20/2012  Admission Diagnoses: Left third nerve palsy due to Left posterior communicating artery anerysm  Discharge Diagnoses:  Left third nerve palsy due to Left posterior communicating artery anerysm Essential hypertension  Active Problems:  * No active hospital problems. *    Discharged Condition: good  Hospital Course: [name] was admitted for an expanding L. pcom anerysm causing retroorbital pain, ptosis, and a nonreactive dilated pupil. CT showed an anerysm. She was taken to the operating room and underwent a Craniotomy for aneurysm clipping. Post op she has done quite well. Her wound is clean dry and without signs of infection. Her pupil is reactive, and the retroorbital pain is much improved.   Consults: None  Significant Diagnostic Studies: angiography: CT  Treatments: surgery: Left Pterional craniotomy for Left posterior communicacting artery aneurysm clipping.  Discharge Exam: Blood pressure 127/97, pulse 89, temperature 97.9 F (36.6 C), temperature source Oral, resp. rate 23, height 5\' 3"  (1.6 m), weight 60.3 kg (132 lb 15 oz), SpO2 99.00%. General appearance: alert, cooperative and appears stated age Neurologic: Alert and oriented X 3, normal strength and tone. Normal symmetric reflexes. Normal coordination and gait  Disposition: 01-Home or Self Care   Medication List  As of 08/20/2012 11:12 AM   TAKE these medications         hydrochlorothiazide 25 MG tablet   Commonly known as: HYDRODIURIL   Take 1 tablet (25 mg total) by mouth daily.      HYDROcodone-acetaminophen 5-325 MG per tablet   Commonly known as: NORCO/VICODIN   Take 1 tablet by mouth every 6 (six) hours as needed for pain.      levETIRAcetam 500 MG tablet   Commonly known as: KEPPRA   Take 1 tablet (500 mg total) by mouth 2 (two) times daily.        lisinopril 10 MG tablet   Commonly known as: PRINIVIL,ZESTRIL   Take 1 tablet (10 mg total) by mouth daily.           Follow-up Information    Follow up with Konnar Ben L, MD in 10 days. (call to make appt to remove staples)    Contact information:   [address] [place] [address] [phone]          Signed: Coletta Memos L 08/20/2012, 11:12 AM

## 2012-08-21 NOTE — Care Management Note (Signed)
    Page 1 of 1   08/21/2012     8:14:49 AM   CARE MANAGEMENT NOTE 08/21/2012  Patient:  Darlene Mcconnell, Darlene Mcconnell   Account Number:  [id number]  Date Initiated:  08/17/2012  Documentation initiated by:  [facility]  Subjective/Objective Assessment:   Admiited with headache, found to have left pcomm aneurysm.     Action/Plan:   08/17/12 aneurysm clipping   Anticipated DC Date:  08/20/2012   Anticipated DC Plan:  HOME/SELF CARE      DC Planning Services  CM consult      Choice offered to / List presented to:             Status of service:  Completed, signed off Medicare Important Message given?   (If response is "NO", the following Medicare IM given date fields will be blank) Date Medicare IM given:   Date Additional Medicare IM given:    Discharge Disposition:  HOME/SELF CARE  Per UR Regulation:  Reviewed for med. necessity/level of care/duration of stay  If discussed at Long Length of Stay Meetings, dates discussed:    Comments:

## 2012-08-23 ENCOUNTER — Encounter (HOSPITAL_COMMUNITY): Payer: Self-pay

## 2013-09-15 ENCOUNTER — Inpatient Hospital Stay (HOSPITAL_COMMUNITY)
Admission: AD | Admit: 2013-09-15 | Discharge: 2013-09-15 | Disposition: A | Discharge status: Home / Self Care | Payer: Self-pay | Source: Ambulatory Visit | Attending: Obstetrics & Gynecology | Admitting: Obstetrics & Gynecology

## 2013-09-15 ENCOUNTER — Encounter (HOSPITAL_COMMUNITY): Payer: Self-pay | Admitting: Family

## 2013-09-15 DIAGNOSIS — I10 Essential (primary) hypertension: Secondary | ICD-10-CM | POA: Insufficient documentation

## 2013-09-15 DIAGNOSIS — A5901 Trichomonal vulvovaginitis: Secondary | ICD-10-CM | POA: Insufficient documentation

## 2013-09-15 HISTORY — DX: Migraine, unspecified, not intractable, without status migrainosus: G43.909

## 2013-09-15 LAB — URINALYSIS, ROUTINE W REFLEX MICROSCOPIC
Glucose, UA: NEGATIVE mg/dL
Leukocytes, UA: NEGATIVE
Protein, ur: NEGATIVE mg/dL
pH: 6 (ref 5.0–8.0)

## 2013-09-15 LAB — URINE MICROSCOPIC-ADD ON

## 2013-09-15 LAB — WET PREP, GENITAL: Yeast Wet Prep HPF POC: NONE SEEN

## 2013-09-15 LAB — POCT PREGNANCY, URINE: Preg Test, Ur: NEGATIVE

## 2013-09-15 MED ORDER — LISINOPRIL 10 MG PO TABS: 10.0000 mg | ORAL_TABLET | Freq: Every day | ORAL | Status: DC

## 2013-09-15 MED ORDER — ONDANSETRON 4 MG PO TBDP
4.0000 mg | ORAL_TABLET | Freq: Once | ORAL | Status: AC
  Administered 2013-09-15: 4 mg via ORAL
  Filled 2013-09-15: qty 1

## 2013-09-15 MED ORDER — METRONIDAZOLE 500 MG PO TABS
2000.0000 mg | ORAL_TABLET | Freq: Once | ORAL | Status: AC
  Administered 2013-09-15: 2000 mg via ORAL
  Filled 2013-09-15: qty 4

## 2013-09-15 NOTE — MAU Note (Signed)
57 yo, G3P2, postmenopausal, reports to MAU with c/o being told by sexual partner this morning he had an STI. Patient did not ask what infection. Denies any changes in vaginal discharge, odor, or prodromal symptoms.

## 2013-09-15 NOTE — MAU Provider Note (Signed)
History     CSN: [id number]  Arrival date and time: 09/15/13 1041   First Provider Initiated Contact with Patient 09/15/13 1152      Chief Complaint  Patient presents with  . STD evaluation    HPI  Darlene Mcconnell is a 57 y.o. female who presents to MAU for STD evaluation. Her partner told her that he has a burning from his penis; he has not been officially diagnosed with anything. She denies vaginal pain, bleeding, discharge or abnormal vaginal symptoms at this time. "just wants to be checked out". Pt has a history of HTN; she ran out of her lisinopril 2 weeks ago which was prescribed by a neurosurgeon.   OB History   Grav Para Term Preterm Abortions TAB SAB Ect Mult Living   3 2 2  1 1    2       Past Medical History  Diagnosis Date  . Hypertension   . Migraine     Past Surgical History  Procedure Laterality Date  . No past surgeries    . Craniotomy  08/17/2012    Procedure: CRANIOTOMY INTRACRANIAL ANEURYSM FOR CAROTID;  Surgeon: Carmela Hurt, MD;  Location: [facility];  Service: Neurosurgery;  Laterality: Left;  Craniotomy for Aneurysm Clipping    History reviewed. No pertinent family history.  History  Substance Use Topics  . Smoking status: Current Every Day Smoker -- 0.25 packs/day    Types: Cigarettes  . Smokeless tobacco: Not on file  . Alcohol Use: Yes    Allergies: No Known Allergies  Prescriptions prior to admission  Medication Sig Dispense Refill  . lisinopril (PRINIVIL,ZESTRIL) 10 MG tablet Take 10 mg by mouth daily.       Results for orders placed during the hospital encounter of 09/15/13 (from the past 24 hour(s))  URINALYSIS, ROUTINE W REFLEX MICROSCOPIC     Status: Abnormal   Collection Time    09/15/13 10:50 AM      Result Value Range   Color, Urine YELLOW  YELLOW   APPearance CLEAR  CLEAR   Specific Gravity, Urine >1.030 (*) 1.005 - 1.030   pH 6.0  5.0 - 8.0   Glucose, UA NEGATIVE  NEGATIVE mg/dL   Hgb urine dipstick  MODERATE (*) NEGATIVE   Bilirubin Urine NEGATIVE  NEGATIVE   Ketones, ur NEGATIVE  NEGATIVE mg/dL   Protein, ur NEGATIVE  NEGATIVE mg/dL   Urobilinogen, UA 0.2  0.0 - 1.0 mg/dL   Nitrite NEGATIVE  NEGATIVE   Leukocytes, UA NEGATIVE  NEGATIVE  URINE MICROSCOPIC-ADD ON     Status: None   Collection Time    09/15/13 10:50 AM      Result Value Range   Squamous Epithelial / LPF RARE  RARE   RBC / HPF 3-6  <3 RBC/hpf   Urine-Other MUCOUS PRESENT    POCT PREGNANCY, URINE     Status: None   Collection Time    09/15/13 10:57 AM      Result Value Range   Preg Test, Ur NEGATIVE  NEGATIVE  WET PREP, GENITAL     Status: Abnormal   Collection Time    09/15/13 12:00 PM      Result Value Range   Yeast Wet Prep HPF POC NONE SEEN  NONE SEEN   Trich, Wet Prep FEW (*) NONE SEEN   Clue Cells Wet Prep HPF POC NONE SEEN  NONE SEEN   WBC, Wet Prep HPF POC FEW (*) NONE SEEN  Review of Systems  Constitutional: Negative for fever, chills, weight loss, malaise/fatigue and diaphoresis.  Gastrointestinal: Negative for heartburn, nausea, vomiting, abdominal pain, diarrhea and constipation.  Genitourinary: Negative for dysuria, urgency, frequency and hematuria.       No vaginal discharge. No vaginal bleeding. No dysuria.   Neurological: Negative for headaches.   Physical Exam   Blood pressure 159/86, pulse 70, temperature 97.8 F (36.6 C), temperature source Oral, resp. rate 16, height 5\' 4"  (1.626 m), weight 58.06 kg (128 lb).  Physical Exam  Constitutional: She is oriented to person, place, and time. She appears well-developed and well-nourished. No distress.  Neck: Neck supple.  Cardiovascular: Normal rate.   Respiratory: Effort normal.  GI: Soft. She exhibits no distension. There is no tenderness. There is no rebound and no guarding.  Genitourinary: Vaginal discharge found.  Speculum exam: Vagina - Small amount of creamy discharge, mild odor Cervix -scant contact bleeding Bimanual  exam: Cervix closed, no CMT  Uterus non tender, normal size Adnexa non tender, no masses bilaterally GC/Chlam, wet prep done Chaperone present for exam.   Neurological: She is alert and oriented to person, place, and time.  Skin: Skin is warm and dry. She is not diaphoretic.  Psychiatric: Her behavior is normal.    MAU Course  Procedures  MDM UA UPT Wet prep GC/Chlamydia- pending  Zofran 4 mg ODT in MAU Flagyl 2,000 mg PO in MAU  Assessment and Plan  A: Trichomonas Vaginitis  Chronic hypertension   P: Discharge home Follow up with [facility] for BP management; contact information given. I have given her a 30 day supply of lisinopril; which was prescribed by Dr. Mikal Plane following an aneurism  RX: Lisinopril 10 mg PO daily (#30) no rf You have been diagnosed with a sexually transmitted disease.  You were treated today, your partner(s) will need to be treated.  No sex until 10 days after you finished your medicine and no sex until 10 days after your partner has taken their medication. Return to MAU for worsening symptoms   RASCH, JENNIFER IRENE FNP-C 09/15/2013, 1:09 PM

## 2013-09-15 NOTE — MAU Note (Signed)
Pt presents for STD evaluation. She states that her boyfriend told her this morning that he has an STD and she wants to be evaluated.

## 2014-03-03 ENCOUNTER — Encounter (HOSPITAL_COMMUNITY): Payer: Self-pay | Admitting: Emergency Medicine

## 2014-03-03 ENCOUNTER — Emergency Department (HOSPITAL_COMMUNITY): Payer: Self-pay

## 2014-03-03 ENCOUNTER — Observation Stay (HOSPITAL_COMMUNITY)
Admission: EM | Admit: 2014-03-03 | Discharge: 2014-03-04 | Disposition: A | Discharge status: Home / Self Care | Payer: Self-pay | Attending: Family Medicine | Admitting: Family Medicine

## 2014-03-03 DIAGNOSIS — G43909 Migraine, unspecified, not intractable, without status migrainosus: Secondary | ICD-10-CM | POA: Insufficient documentation

## 2014-03-03 DIAGNOSIS — E876 Hypokalemia: Secondary | ICD-10-CM | POA: Insufficient documentation

## 2014-03-03 DIAGNOSIS — I10 Essential (primary) hypertension: Secondary | ICD-10-CM | POA: Insufficient documentation

## 2014-03-03 DIAGNOSIS — R0902 Hypoxemia: Secondary | ICD-10-CM

## 2014-03-03 DIAGNOSIS — A419 Sepsis, unspecified organism: Principal | ICD-10-CM | POA: Insufficient documentation

## 2014-03-03 DIAGNOSIS — R000 Tachycardia, unspecified: Secondary | ICD-10-CM

## 2014-03-03 DIAGNOSIS — R079 Chest pain, unspecified: Secondary | ICD-10-CM | POA: Diagnosis present

## 2014-03-03 DIAGNOSIS — J189 Pneumonia, unspecified organism: Secondary | ICD-10-CM

## 2014-03-03 DIAGNOSIS — F17200 Nicotine dependence, unspecified, uncomplicated: Secondary | ICD-10-CM | POA: Insufficient documentation

## 2014-03-03 HISTORY — DX: Cerebral aneurysm, nonruptured: I67.1

## 2014-03-03 LAB — BASIC METABOLIC PANEL
BUN: 13 mg/dL (ref 6–23)
CHLORIDE: 95 meq/L — AB (ref 96–112)
CO2: 23 mEq/L (ref 19–32)
Calcium: 9.6 mg/dL (ref 8.4–10.5)
Creatinine, Ser: 0.93 mg/dL (ref 0.50–1.10)
GFR calc Af Amer: 78 mL/min — ABNORMAL LOW (ref >90)
GFR, EST NON AFRICAN AMERICAN: 67 mL/min — AB (ref >90)
GLUCOSE: 182 mg/dL — AB (ref 70–99)
Potassium: 3.4 mEq/L — ABNORMAL LOW (ref 3.7–5.3)
SODIUM: 136 meq/L — AB (ref 137–147)

## 2014-03-03 LAB — I-STAT CG4 LACTIC ACID, ED: Lactic Acid, Venous: 1.54 mmol/L (ref 0.5–2.2)

## 2014-03-03 LAB — CBC
HCT: 37.4 % (ref 36.0–46.0)
Hemoglobin: 13.6 g/dL (ref 12.0–15.0)
MCH: 36 pg — ABNORMAL HIGH (ref 26.0–34.0)
MCHC: 36.4 g/dL — ABNORMAL HIGH (ref 30.0–36.0)
MCV: 98.9 fL (ref 78.0–100.0)
Platelets: 254 10*3/uL (ref 150–400)
RBC: 3.78 MIL/uL — ABNORMAL LOW (ref 3.87–5.11)
RDW: 14.1 % (ref 11.5–15.5)
WBC: 25.3 10*3/uL — ABNORMAL HIGH (ref 4.0–10.5)

## 2014-03-03 MED ORDER — ACETAMINOPHEN 325 MG PO TABS
650.0000 mg | ORAL_TABLET | Freq: Four times a day (QID) | ORAL | Status: DC | PRN
  Administered 2014-03-03 – 2014-03-04 (×4): 650 mg via ORAL
  Filled 2014-03-03 (×4): qty 2

## 2014-03-03 MED ORDER — POTASSIUM CHLORIDE CRYS ER 20 MEQ PO TBCR: 20.0000 meq | EXTENDED_RELEASE_TABLET | Freq: Two times a day (BID) | ORAL | Status: DC

## 2014-03-03 MED ORDER — ENOXAPARIN SODIUM 40 MG/0.4ML ~~LOC~~ SOLN
40.0000 mg | SUBCUTANEOUS | Status: DC
  Administered 2014-03-03: 40 mg via SUBCUTANEOUS
  Filled 2014-03-03 (×2): qty 0.4

## 2014-03-03 MED ORDER — AZITHROMYCIN 250 MG PO TABS
250.0000 mg | ORAL_TABLET | Freq: Every day | ORAL | Status: DC
  Administered 2014-03-03 – 2014-03-04 (×2): 250 mg via ORAL
  Filled 2014-03-03 (×2): qty 1

## 2014-03-03 MED ORDER — SODIUM CHLORIDE 0.9 % IV BOLUS (SEPSIS)
1000.0000 mL | Freq: Once | INTRAVENOUS | Status: AC
  Administered 2014-03-03: 1000 mL via INTRAVENOUS

## 2014-03-03 MED ORDER — DEXTROSE 5 % IV SOLN: 500.0000 mg | Freq: Once | INTRAVENOUS | Status: DC

## 2014-03-03 MED ORDER — DEXTROSE 5 % IV SOLN
1.0000 g | Freq: Once | INTRAVENOUS | Status: AC
  Administered 2014-03-03: 1 g via INTRAVENOUS
  Filled 2014-03-03: qty 10

## 2014-03-03 MED ORDER — POTASSIUM CHLORIDE CRYS ER 20 MEQ PO TBCR
20.0000 meq | EXTENDED_RELEASE_TABLET | Freq: Two times a day (BID) | ORAL | Status: AC
  Administered 2014-03-03 – 2014-03-04 (×2): 20 meq via ORAL
  Filled 2014-03-03 (×2): qty 1

## 2014-03-03 NOTE — ED Provider Notes (Signed)
CSN: [id number]     Arrival date & time 03/03/14  1453 History   First MD Initiated Contact with Patient 03/03/14 1527     Chief Complaint  Patient presents with  . Chest Pain  . Shortness of Breath     Patient is a 58 y.o. female presenting with cough.  Cough Cough characteristics:  Productive Sputum characteristics:  Green and brown Severity:  Moderate Onset quality:  Gradual Duration:  5 days Timing:  Constant Progression:  Worsening Chronicity:  New Smoker: yes   Context: sick contacts   Context: not animal exposure and not exposure to allergens   Relieved by:  Nothing Worsened by:  Nothing tried Ineffective treatments:  None tried Associated symptoms: chest pain   Associated symptoms: no chills, no ear pain, no fever, no headaches, no rash, no rhinorrhea and no shortness of breath   Associated symptoms comment:  Developed R sided CP after a couple days of cough    Past Medical History  Diagnosis Date  . Hypertension   . Migraine    Past Surgical History  Procedure Laterality Date  . No past surgeries    . Craniotomy  08/17/2012    Procedure: CRANIOTOMY INTRACRANIAL ANEURYSM FOR CAROTID;  Surgeon: Winfield Cunas, MD;  Location: [place] [facility];  Service: Neurosurgery;  Laterality: Left;  Craniotomy for Aneurysm Clipping   History reviewed. No pertinent family history. History  Substance Use Topics  . Smoking status: Current Every Day Smoker -- 0.25 packs/day    Types: Cigarettes  . Smokeless tobacco: Not on file  . Alcohol Use: Yes   OB History   Grav Para Term Preterm Abortions TAB SAB Ect Mult Living   3 2 2  1 1    2      Review of Systems  Constitutional: Negative for fever, chills, activity change and appetite change.  HENT: Negative for congestion, ear pain and rhinorrhea.   Eyes: Negative for pain.  Respiratory: Positive for cough. Negative for shortness of breath.   Cardiovascular: Positive for chest pain. Negative for palpitations.   Gastrointestinal: Negative for nausea, vomiting and abdominal pain.  Genitourinary: Negative for dysuria, difficulty urinating and pelvic pain.  Musculoskeletal: Negative for back pain and neck pain.  Skin: Negative for rash and wound.  Neurological: Negative for weakness and headaches.  Psychiatric/Behavioral: Negative for behavioral problems, confusion and agitation.      Allergies  Review of patient's allergies indicates no known allergies.  Home Medications   No current outpatient prescriptions on file. BP 146/72  Pulse 104  Temp(Src) 103.1 F (39.5 C) (Oral)  Resp 28  SpO2 100% Physical Exam  Constitutional: She is oriented to person, place, and time. She appears well-developed and well-nourished. No distress.  HENT:  Head: Normocephalic and atraumatic.  Nose: Nose normal.  Mouth/Throat: Oropharynx is clear and moist.  Eyes: EOM are normal. Pupils are equal, round, and reactive to light.  Neck: Normal range of motion. Neck supple. No tracheal deviation present.  Cardiovascular: Regular rhythm, normal heart sounds and intact distal pulses.   Tachycardic   Pulmonary/Chest: She is in respiratory distress. She has rales. She exhibits tenderness ( R sided ).  Abdominal: Soft. Bowel sounds are normal. She exhibits no distension. There is no tenderness. There is no rebound and no guarding.  Musculoskeletal: Normal range of motion. She exhibits no tenderness.  Neurological: She is alert and oriented to person, place, and time.  Skin: Skin is warm and dry. No rash noted.  Psychiatric: She has a normal mood and affect. Her behavior is normal.    ED Course  Procedures (including critical care time) Labs Review Labs Reviewed  BASIC METABOLIC PANEL - Abnormal; Notable for the following:    Sodium 136 (*)    Potassium 3.4 (*)    Chloride 95 (*)    Glucose, Bld 182 (*)    GFR calc non Af Amer 67 (*)    GFR calc Af Amer 78 (*)    All other components within normal limits   CBC - Abnormal; Notable for the following:    WBC 25.3 (*)    RBC 3.78 (*)    MCH 36.0 (*)    MCHC 36.4 (*)    All other components within normal limits  CULTURE, BLOOD (ROUTINE X 2)  CULTURE, BLOOD (ROUTINE X 2)  URINALYSIS, ROUTINE W REFLEX MICROSCOPIC  I-STAT CG4 LACTIC ACID, ED   Imaging Review Dg Chest 2 View  03/03/2014   CLINICAL DATA:  Chest pain and productive cough.  EXAM: CHEST  2 VIEW  COMPARISON:  DG CHEST 2 VIEW dated 08/16/2012  FINDINGS: There is dense consolidation in the right middle lobe and volume loss consistent with atelectasis and likely associated pneumonia. The right upper and lower lobes demonstrate compensatory hyperinflation. The left lung is clear. The cardiac silhouette is normal in size. The mediastinum is not shifted. There is no pleural effusion or pneumothorax. The observed portions of the bony thorax appear normal. There is a nonspecific gas pattern in the upper abdomen.  IMPRESSION: There is dense atelectasis of the right middle lobe. There is likely a coexisting pneumonia. Followup films following therapy are recommended to assure clearing.   Electronically Signed   By: David  Martinique   On: 03/03/2014 16:33    MDM   Final diagnoses:  CAP (community acquired pneumonia)  Hypoxic  Tachycardia    58 yo F who presents with 5 days of productive cough, fevers, and sob. Since then she has developed R sided pleuritic CP. CXR with RML PNA. No risk factors for HCAP will start on Azithormycin/Rocephin. MEdicine team consulted given new oxygen requirement.   5:13 PM Spoke with Dr. Tamala Julian. Plan to admit patient for further care. Blood cultures drawn prior to abx administration. Case co managed with my attending Dr. Leonides Schanz.   Patient admitted to [facility] medicine team under care of Dr. Lindell Noe.    Ruthell Rummage, MD 03/03/14 1800

## 2014-03-03 NOTE — H&P (Signed)
[facility] Admission History and Physical Service Pager: [phone]  Patient name: Darlene Mcconnell Medical record number: [id number] Date of birth: 1956-07-21 Age: 58 y.o. Gender: female  Primary Care Provider: No PCP Per Patient Consultants: none Code Status: full  Chief Complaint:  Shortness of breath, chest pain   Assessment and Plan: Darlene Mcconnell is a 58 y.o. female presenting with community acquired pneumonia. 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   # Sepsis due to Community Acquired Pneumonia: Pt with fever (103.1), leukocytosis (25.3), tachycardia (121), pleuritic chest pain and productive cough with middle lobe infiltrate seen on CXR consistent with CAP and resultant sepsis. She was initially hypoxemic in the ED, but not upon our evaluation. She was hemodynamically stable.  - S/p Ceftriaxone and Azithromycin in ED - Consider pt with likely alcohol abuse, we will check urine strep and legionella antigens, blood culture, sputum culture and gram stain and influenza pcr - If pt not improving overnight, consider expanding coverage for aspiration pneumonia, and ordering a pro calcitonin and viral respiratory panel  #HTN: Hx of taking lisinopril, but has been out for > 6 mos - Hold BP meds as as not to obscure clinic picture if pt gets septic - Restart ACE-I upon discharge   #Tobacco abuse: 1/2 ppd x 10 years - Consider nicotine supplementation as needed  #Alcohol use: pt admitted to 3-4 beers after work but not everyday, so conservative estimate of 12-16 beers per week - Consider CIWA - Expand work-up of pneumonia given her alcohol use   #Hypokalemia: 3.4 on admission - Replete 20 meq x 2  FEN/GI: regular diet  Prophylaxis: lovenox  Disposition: Admit to [facility] Medicine Teaching Service for observation   History of  Present Illness: Darlene Mcconnell is a 58 y.o. female presenting with right sided chest pain that started yesterday. She does endorse some pain currently, but better than yesterday.  The pain is 8/10 currently and exacerbated by taking a deep breath.  Increased sputum production of "green" phlegm with cough. She denies any nausea or vomiting. She had some diarrhea yesterday. She has not been around anyone that has been sick. She denies any lung problems, hx of infections or use of O2. She denies any heart problems. She denies any rashes. She has taken alka seltzer cold plus and it didn't help.  She stayed in bed the whole day yesterday. She has some anorexia and hasn't drank much for the past couple of days. 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 No recent travel anywhere. Denies any dysuria or polyuria.   She didn't receive the flu shot this year.   ED course: Pt placed on oxygen for reported desaturations, given ceftriaxone 1 gm and azithromycin 500 mg IV x 1, blood cultures ordered  Review Of Systems: Per HPI with the following additions: See HPI  Otherwise 12 point review of systems was performed and was unremarkable.  Patient Active Problem List   Diagnosis Date Noted  . CAP (community acquired pneumonia) 03/03/2014   Past Medical History: Past Medical History  Diagnosis Date  . Hypertension   . Migraine   . Intracranial aneurysm 2013    left   Past Surgical History: Past Surgical History  Procedure Laterality Date  . No past surgeries    . Craniotomy  08/17/2012    Procedure: CRANIOTOMY INTRACRANIAL ANEURYSM FOR CAROTID;  Surgeon: Winfield Cunas, MD;  Location: [place] [facility];  Service: Neurosurgery;  Laterality: Left;  Craniotomy for Aneurysm Clipping   Social History: History  Substance Use Topics  . Smoking status: Current Every Day Smoker -- 0.25 packs/day for 12 years    Types: Cigarettes  . Smokeless tobacco: Not on file  . Alcohol  Use: Yes     Comment: Beer (3-4) every night when she gets off work.    Additional social history: pt work as Quarry manager, lives with daughters Vita [place] and [place]   Please also refer to relevant sections of EMR.  Family History: History reviewed. No pertinent family history. Allergies and Medications: No Known Allergies No current facility-administered medications on file prior to encounter.   No current outpatient prescriptions on file prior to encounter.    Objective: BP 147/67  Pulse 111  Temp(Src) 102.9 F (39.4 C) (Oral)  Resp 18  SpO2 96% Exam: General: NAD, African American female, ill but non toxic appearing HEENT: cranial defect of left fronto-temporal area, EOMI, PERRL, MMM, no exudates on tonsils, oropharynx clear,  Cardiovascular: Tachycardic, S1S2, no murmurs, rubs or gallops.  Respiratory: Tachypneic  Abdomen: soft, NDNT Extremities: warm, well perfused, no edema of feet or calves Skin: no rashes, warm and dry Neuro: alert and oriented, no focal deficits   Labs and Imaging: CBC BMET   Recent Labs Lab 03/03/14 1543  WBC 25.3*  HGB 13.6  HCT 37.4  PLT 254    Recent Labs Lab 03/03/14 1543  NA 136*  K 3.4*  CL 95*  CO2 23  BUN 13  CREATININE 0.93  GLUCOSE 182*  CALCIUM 9.6     CXR:  IMPRESSION: There is dense atelectasis of the right middle lobe. There is likely a coexisting pneumonia. Followup films following therapy are recommended to assure clearing.   Angelica Ran, MD 03/03/2014, 7:29 PM PGY-3, [place] Intern pager: [phone], text pages welcome

## 2014-03-03 NOTE — ED Notes (Addendum)
Pt reports not feeling well for several days, having sob, productive cough with chest pains, fever. Reports coughing up brown/black sputum. spo2 initially was 74% on room air, currently 93% on 2L [place]. Mask on pt at triage.

## 2014-03-04 ENCOUNTER — Telehealth: Payer: Self-pay | Admitting: Family Medicine

## 2014-03-04 DIAGNOSIS — A419 Sepsis, unspecified organism: Secondary | ICD-10-CM | POA: Diagnosis present

## 2014-03-04 DIAGNOSIS — R0902 Hypoxemia: Secondary | ICD-10-CM

## 2014-03-04 DIAGNOSIS — R079 Chest pain, unspecified: Secondary | ICD-10-CM | POA: Diagnosis present

## 2014-03-04 DIAGNOSIS — I10 Essential (primary) hypertension: Secondary | ICD-10-CM | POA: Diagnosis present

## 2014-03-04 DIAGNOSIS — R000 Tachycardia, unspecified: Secondary | ICD-10-CM

## 2014-03-04 LAB — LEGIONELLA ANTIGEN, URINE: Legionella Antigen, Urine: NEGATIVE

## 2014-03-04 LAB — CBC
HEMATOCRIT: 34 % — AB (ref 36.0–46.0)
HEMOGLOBIN: 11.7 g/dL — AB (ref 12.0–15.0)
MCH: 34.3 pg — AB (ref 26.0–34.0)
MCHC: 34.4 g/dL (ref 30.0–36.0)
MCV: 99.7 fL (ref 78.0–100.0)
Platelets: 237 10*3/uL (ref 150–400)
RBC: 3.41 MIL/uL — ABNORMAL LOW (ref 3.87–5.11)
RDW: 14.3 % (ref 11.5–15.5)
WBC: 19.1 10*3/uL — ABNORMAL HIGH (ref 4.0–10.5)

## 2014-03-04 LAB — BASIC METABOLIC PANEL
BUN: 13 mg/dL (ref 6–23)
CALCIUM: 8.8 mg/dL (ref 8.4–10.5)
CO2: 25 meq/L (ref 19–32)
Chloride: 103 mEq/L (ref 96–112)
Creatinine, Ser: 0.9 mg/dL (ref 0.50–1.10)
GFR calc Af Amer: 81 mL/min — ABNORMAL LOW (ref >90)
GFR calc non Af Amer: 70 mL/min — ABNORMAL LOW (ref >90)
GLUCOSE: 114 mg/dL — AB (ref 70–99)
Potassium: 3.4 mEq/L — ABNORMAL LOW (ref 3.7–5.3)
SODIUM: 140 meq/L (ref 137–147)

## 2014-03-04 LAB — INFLUENZA PANEL BY PCR (TYPE A & B)
H1N1FLUPCR: NOT DETECTED
INFLBPCR: NEGATIVE
Influenza A By PCR: NEGATIVE

## 2014-03-04 LAB — STREP PNEUMONIAE URINARY ANTIGEN: Strep Pneumo Urinary Antigen: NEGATIVE

## 2014-03-04 MED ORDER — AZITHROMYCIN 250 MG PO TABS: 250.0000 mg | ORAL_TABLET | Freq: Every day | ORAL | Status: DC

## 2014-03-04 MED ORDER — LISINOPRIL 10 MG PO TABS: 10.0000 mg | ORAL_TABLET | Freq: Every day | ORAL | Status: DC

## 2014-03-04 NOTE — Discharge Instructions (Signed)

## 2014-03-04 NOTE — H&P (Signed)
FMTS Attending Admit Note Patient seen and examined by me, discussed with resident team and I agree with Dr Thea Gist note and admission plan.  Briefly, 58yoF admitted with fever, cough, sputum production and tachycardia, with admission WBC count of 25K.  She is currently afebrile, and she reports to feeling better.  Does have significant chest pain with cough, scant sputum production at this time.  She is a smoker; drinks alcohol ("beer"), last alcohol intake was on Friday night (March 6th).  She is on oxygen supplementation at 2L/min nasal canula.  Alert, no apparent distress Neck supple.  Regular S1S2 PULM Crackles appreciated across R lung field.  Significant cough with exam.  No increased work of breathing.   A/P: Patient admitted with CAP, seems to be responding clinically to ceftriaxone and azithromycin.  I don't see a CBC from this morning to follow WBC count, but it appears one has been ordered.   If continues to spike fevers or if WBC count not improving or absence of clinical improvement, may consider anaerobic coverage in setting of reported significant EtOH use and thus risk for aspiration PNA.  Follow blood cultures for narrowing of abx. Titrate off supplemental oxygen as clinical condition allows.  Dalbert Mayotte, MD

## 2014-03-04 NOTE — Progress Notes (Signed)
FMTS Attending Daily Note:  Annabell Sabal MD  [phone] pager  Family Practice pager:  [phone] I have discussed this patient with the resident Dr. Sherril Cong and attending physician Dr. Lindell Noe.  I agree with their findings, assessment, and care plan.  Looks better clinically than her numbers appear.  Nicely improved.  Plan for DC home today.

## 2014-03-04 NOTE — Telephone Encounter (Signed)
Patient admitted 3/8 and discharged today 3/9; single blood culture came back positive after discharge. Called patient and discussed result, that it is likely a contaminate, but that we would still like her to call the clinic in the morning to be seen for same day visit. She currently says she feels okay. Patient given clinic phone number and address; patient agreed with plan. -Dr. Lamar Benes

## 2014-03-04 NOTE — Discharge Summary (Signed)
[facility] Discharge Summary  Patient name: Darlene Mcconnell Medical record number: [id number] Date of birth: [birth year] Age: 58 y.o. Gender: female Date of Admission: 03/03/2014  Date of Discharge: 03/04/2014  Admitting Physician: Willeen Niece, MD  Primary Care Provider: No PCP Per Patient Consultants: none  Indication for Hospitalization: dyspnea, CAP  Discharge Diagnoses/Problem List:  Patient Active Problem List   Diagnosis Date Noted  . Sepsis 03/04/2014  . Chest pain 03/04/2014  . Essential hypertension, benign 03/04/2014  . CAP (community acquired pneumonia) 03/03/2014    Disposition: home  Discharge Condition: improved  Brief Hospital Course: Darlene Mcconnell is a 58 y.o. female presenting with community acquired pneumonia. 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   # Sepsis due to Community Acquired Pneumonia: Pt was admitted with fever (103.1), leukocytosis (25.3), tachycardia (121), pleuritic chest pain and productive cough with middle lobe infiltrate seen on CXR consistent with CAP and resultant sepsis. She was hemodynamically stable. She was treated with ceftriaxone and azithromycin, after which she was afebrile with a down-trending white blood cell count and felt better. She was discharged with PO azithromycin to finish a 5 day course for community acquired pneumonia.   Issues for Follow Up:  # HTN: h/o lisinopril treatment but off for >6 months, restarted on discharge. Follow pressures and adjust as needed.  # CAP: F/u resolution of symptoms and appropriateness of antibiotics based on final cultures. Consider repeat chest xray if symptoms persist inappropriately.  Significant Procedures: none  Significant Labs and Imaging:   Recent Labs Lab 03/03/14 1543 03/04/14 1057  WBC 25.3* 19.1*  HGB 13.6 11.7*  HCT 37.4  34.0*  PLT 254 237    Recent Labs Lab 03/03/14 1543 03/04/14 1057  NA 136* 140  K 3.4* 3.4*  CL 95* 103  CO2 23 25  GLUCOSE 182* 114*  BUN 13 13  CREATININE 0.93 0.90  CALCIUM 9.6 8.8   Flu neg  Lactic acid 1.54  Urine strep neg   CXR: There is dense atelectasis of the right middle lobe. There is likely a coexisting pneumonia. Followup films following therapy are recommended to assure clearing.  Results/Tests Pending at Time of Discharge:  BCx  Sputum Cx  Discharge Medications:    Medication List         azithromycin 250 MG tablet  Commonly known as:  ZITHROMAX  Take 1 tablet (250 mg total) by mouth daily.     lisinopril 10 MG tablet  Commonly known as:  PRINIVIL,ZESTRIL  Take 1 tablet (10 mg total) by mouth daily.       Discharge Instructions: Please refer to Patient Instructions section of EMR for full details.  Patient was counseled important signs and symptoms that should prompt return to medical care, changes in medications, dietary instructions, activity restrictions, and follow up appointments.   Follow-Up Appointments: Follow-up Information   Follow up with Tawanna Sat, MD On 03/08/2014. (3pm)    Specialty:  Family Medicine   Contact information:   [place] [phone]      Beverlyn Roux, MD 03/04/2014, 4:26 PM PGY-1, [place]

## 2014-03-04 NOTE — ED Provider Notes (Signed)
I saw and evaluated the patient, reviewed the resident's note and I agree with the findings and plan.   EKG Interpretation None      Pt is a 58 y.o. F with 5 days of productive cough fever and shortness of breath. Her chest x-ray shows a right middle lobe pneumonia. She has not been recently hospitalized and is not immunocompromised. We'll give Zithromax and Rocephin. She has a new oxygen requirement. Patient is normotensive but tachycardic and has a leukocytosis. We'll be admitted for sepsis.    [place], DO 03/04/14 2357

## 2014-03-04 NOTE — Progress Notes (Signed)
Family Medicine Teaching Service Daily Progress Note Intern Pager: [phone]  Patient name: Darlene Mcconnell Medical record number: [id number] Date of birth: 1955/12/28 Age: 58 y.o. Gender: female  Primary Care Provider: No PCP Per Patient Consultants: none Code Status: full  Pt Overview and Major Events to Date:  3/8 - admitted with sob and chest pain  Assessment and Plan: Darlene Mcconnell is a 59 y.o. female presenting with community acquired pneumonia. 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   # Sepsis due to Community Acquired Pneumonia: Pt with fever (103.1), leukocytosis (25.3), tachycardia (121), pleuritic chest pain and productive cough with middle lobe infiltrate seen on CXR consistent with CAP and resultant sepsis. She was initially hypoxemic in the ED, but not upon our evaluation. She was hemodynamically stable.  - S/p Ceftriaxone and Azithromycin in ED  - Consider pt with likely alcohol abuse, we will check urine strep and legionella antigens, blood culture, sputum culture and gram stain and influenza pcr  - urine strep neg, flu neg,  - Improved this am and afebrile since midnight - continue PO azithro  #HTN: Hx of taking lisinopril, but has been out for > 6 mos  - Holding BP meds as as not to obscure clinic picture if pt gets septic  - Restart ACE-I upon discharge   #Tobacco abuse: 1/2 ppd x 10 years  - Consider nicotine supplementation as needed   #Alcohol use: pt admitted to 3-4 beers after work but not everyday, so conservative estimate of 12-16 beers per week  - Consider CIWA  - Expand work-up of pneumonia given her alcohol use   #Hypokalemia: 3.4 on admission  - Replete 20 meq x 2   FEN/GI: regular diet  Prophylaxis: lovenox  Disposition: likely stable for discharge today to complete po azithro course for CAP, will wait for CBC/BMET to eval  resolution of hypok and downtrending WBC  Subjective: still having pleuritic chest pain but feeling a lot better with improved SOB as well  Objective: Temp:  [98.7 F (37.1 C)-103.1 F (39.5 C)] 98.7 F (37.1 C) (03/09 0536) Pulse Rate:  [72-121] 72 (03/09 0536) Resp:  [18-30] 19 (03/09 0536) BP: (127-163)/(63-85) 139/81 mmHg (03/09 0536) SpO2:  [73 %-100 %] 100 % (03/09 0536) Physical Exam: General: NAD, African American female, ill but non toxic appearing  HEENT: NCAT, MMM Cardiovascular: RRR, S1S2, no murmurs, rubs or gallops.  Respiratory: CTAB Abdomen: soft, NDNT  Extremities: warm, well perfused, no edema of feet or calves  Skin: no rashes, warm and dry  Neuro: alert and oriented, no focal deficits   Laboratory:  Recent Labs Lab 03/03/14 1543  WBC 25.3*  HGB 13.6  HCT 37.4  PLT 254    Recent Labs Lab 03/03/14 1543  NA 136*  K 3.4*  CL 95*  CO2 23  BUN 13  CREATININE 0.93  CALCIUM 9.6  GLUCOSE 182*   Flu neg Lactic acid 1.54 Urine strep neg  Imaging/Diagnostic Tests: CXR: There is dense atelectasis of the right middle lobe. There is likely a coexisting pneumonia. Followup films following therapy are recommended to assure clearing.   Beverlyn Roux, MD 03/04/2014, 7:42 AM PGY-1, [place] Intern pager: [phone], text pages welcome

## 2014-03-05 ENCOUNTER — Encounter: Payer: Self-pay | Admitting: Family Medicine

## 2014-03-05 ENCOUNTER — Ambulatory Visit (INDEPENDENT_AMBULATORY_CARE_PROVIDER_SITE_OTHER): Payer: Self-pay | Admitting: Family Medicine

## 2014-03-05 ENCOUNTER — Telehealth: Payer: Self-pay | Admitting: Family Medicine

## 2014-03-05 VITALS — BP 126/77 | HR 66 | Ht 63.0 in | Wt 130.5 lb

## 2014-03-05 DIAGNOSIS — Z711 Person with feared health complaint in whom no diagnosis is made: Secondary | ICD-10-CM

## 2014-03-05 DIAGNOSIS — R21 Rash and other nonspecific skin eruption: Secondary | ICD-10-CM

## 2014-03-05 DIAGNOSIS — J189 Pneumonia, unspecified organism: Secondary | ICD-10-CM

## 2014-03-05 DIAGNOSIS — R7881 Bacteremia: Secondary | ICD-10-CM

## 2014-03-05 NOTE — Telephone Encounter (Signed)
Dr Lamar Benes spoke with patient yesterday about this and she was not feeling any worse than at discharge. He asked her to make appt to f/u today 3/10 and she has appt 3pm with Dr Wendi Snipes for f/u.  Hilton Sinclair, MD

## 2014-03-05 NOTE — Telephone Encounter (Signed)
She tested positve for GPCCL. Aerobic culture

## 2014-03-05 NOTE — Assessment & Plan Note (Addendum)
Likely contaminant, clinically improving, afebrile 1/2 blood cultures with G+C, likely a contaminant but not speciated yet If is is staph epi then will treat as a contaminant If returns as staph aureus, after conversation with Dr. Andria Frames, will re-admit for IV antibiotics.

## 2014-03-05 NOTE — Patient Instructions (Signed)
It was great to see you today!  You had 2 blood cultures. One of them is growing bacteria, I believe this is a contaminant.   Remember, if you begin developiong fevers, chills, sweats, nausea, malaise, or vomiting please get medical help right away.

## 2014-03-05 NOTE — Assessment & Plan Note (Signed)
Hyperpigmented dorsal hand rash I feel is most likely post inflammatory hyperpigmentation, likely after contact dermatitis from chemical exposure Hyperpigmented macules or possibly syphilis as discussed above With her hypopigmented macules on her bilateral arms and other described rashes I would consider an autoimmune process if no other etiologies become clear.

## 2014-03-05 NOTE — Assessment & Plan Note (Signed)
Check RPR and HIV Rash on palms and soles possibly syphilis

## 2014-03-05 NOTE — Assessment & Plan Note (Addendum)
Improving, continue azithromycin Given work letter to excuse for 3 more days

## 2014-03-05 NOTE — Telephone Encounter (Signed)
Spoke with Dr. Wendi Snipes.  I called Heather in Hospital Lab back and asked her to page Dr. Dianah Field with results PER Dr. Alen Bleacher request.  Karoline Caldwell, Loralyn Freshwater, CMA

## 2014-03-05 NOTE — Progress Notes (Signed)
Patient ID: Darlene Mcconnell, female   DOB: Sep 12, 1956, 58 y.o.   MRN: [id number]  [name], MD Phone: [phone]  Subjective:  Chief complaint-noted  # Patient here followup for positive blood culture, also mentions hand rash  Blood culture, recent hospitalization with CAP Patient states that she is discharged to continue taking her azithromycin, she denies any more fevers. She feels much better than when she first presented to the hospital. She denies any worsening dyspnea, cough, or malaise/fatigue. She states she is not back at baseline but is much improved.   Rash She has a rash with several dark spots on her palms and soles for an unknown tiime. She is she's had one sexual partner in the last year, she does not use condoms regularly. She denies any vaginal itching, discharge, or perineal lesions or warts. She is a separate rash with dark skin from her bilateral knuckles to her fingers for approximately 2-3 months. She states that his rash is getting worse it has not improved. She denies any itching or irritation of his rash. 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 The other people at work use the same chemicals but where gloves.   ROS- Per HPI  Past Medical History Patient Active Problem List   Diagnosis Date Noted  . Rash of hands 03/05/2014  . Concern about STD in female without diagnosis 03/05/2014  . Positive blood culture 03/05/2014  . Sepsis 03/04/2014  . Chest pain 03/04/2014  . Essential hypertension, benign 03/04/2014  . CAP (community acquired pneumonia) 03/03/2014    Medications- reviewed and updated Current Outpatient Prescriptions  Medication Sig Dispense Refill  . azithromycin (ZITHROMAX) 250 MG tablet Take 1 tablet (250 mg total) by mouth daily.  4 each  0  . lisinopril (PRINIVIL,ZESTRIL) 10 MG tablet Take 1 tablet (10 mg total) by mouth daily.  30 tablet  11   No current  facility-administered medications for this visit.    Objective: BP 126/77  Pulse 66  Ht 5\' 3"  (1.6 m)  Wt 130 lb 8 oz (59.194 kg)  BMI 23.12 kg/m2  SpO2 98% Gen: NAD, alert, cooperative with exam HEENT: NCAT CV: RRR, good S1/S2, no murmur Resp: CTABL, no wheezes, non-labored Ext: No edema, warm Neuro: Alert and oriented, No gross deficits Skin: Bilateral hands with hyperpigmented skin from the knuckles extending distally to the fingertips. Palms and soles with small 2-5 mm sharply demarcated hyperpigmented flat macules that are non-erythematous or painful to the touch. No splinter hemorrhages  Several hypopigmented 1-3 mm flat macules on BL arms   Assessment/Plan:  CAP (community acquired pneumonia) Improving, continue azithromycin Given work letter to excuse for 3 more days    Concern about STD in female without diagnosis Check RPR and HIV Rash on palms and soles possibly syphilis    Rash of hands Hyperpigmented dorsal hand rash I feel is most likely post inflammatory hyperpigmentation, likely after contact dermatitis from chemical exposure Hyperpigmented macules or possibly syphilis as discussed above With her hypopigmented macules on her bilateral arms and other described rashes I would consider an autoimmune process if no other etiologies become clear.   Positive blood culture Likely contaminant, clinically improving, afebrile 1/2 blood cultures with G+C, likely a contaminant but not speciated yet If is is staph epi then will treat as a contaminant If returns as staph aureus, after conversation with Dr. Andria Frames, will re-admit for IV antibiotics.  Orders Placed This Encounter  Procedures  . RPR  . HIV Antibody

## 2014-03-05 NOTE — Telephone Encounter (Signed)
Will fwd to Dr. Wendi Snipes.   Dashanae Longfield, Loralyn Freshwater, [place]

## 2014-03-06 LAB — HIV ANTIBODY (ROUTINE TESTING W REFLEX): HIV: NONREACTIVE

## 2014-03-06 LAB — RPR

## 2014-03-06 NOTE — Discharge Summary (Signed)
Family Medicine Teaching Service  Discharge Note : Attending Jeff Morse Brueggemann MD Pager [phone] Inpatient Team Pager:  [phone]  I have reviewed this patient and the patient's chart and have discussed discharge planning with the resident at the time of discharge. I agree with the discharge plan as above.    

## 2014-03-07 LAB — CULTURE, BLOOD (ROUTINE X 2)

## 2014-03-08 ENCOUNTER — Telehealth: Payer: Self-pay | Admitting: Family Medicine

## 2014-03-08 ENCOUNTER — Ambulatory Visit (INDEPENDENT_AMBULATORY_CARE_PROVIDER_SITE_OTHER): Payer: Self-pay | Admitting: Family Medicine

## 2014-03-08 ENCOUNTER — Encounter: Payer: Self-pay | Admitting: Family Medicine

## 2014-03-08 VITALS — BP 153/88 | HR 72 | Temp 98.3°F | Ht 63.0 in | Wt 132.1 lb

## 2014-03-08 DIAGNOSIS — J189 Pneumonia, unspecified organism: Secondary | ICD-10-CM

## 2014-03-08 DIAGNOSIS — R21 Rash and other nonspecific skin eruption: Secondary | ICD-10-CM

## 2014-03-08 NOTE — Progress Notes (Signed)
Patient ID: Darlene Mcconnell, female   DOB: [birth year], 58 y.o.   MRN: [id number]   Subjective:    Patient ID: Darlene Mcconnell, female    DOB: [birth year], 58 y.o.   MRN: [id number]  HPI  CC: hospital follow up  # Pneumonia:  Finished course of azithromycin  Some cough every now and then, not productive  No shortness of breath, fevers, chills, chest pain  # Skin rash on hands:  Had negative RPR and HIV testing on Monday  Not painful, first noticed about 5-6 months ago ROS: no genital lesions or discharge, no dizziness/unsteady gait, no fevers or chills  Review of Systems   See HPI for ROS. Objective:  BP 153/88  Pulse 72  Temp(Src) 98.3 F (36.8 C) (Oral)  Ht 5\' 3"  (1.6 m)  Wt 132 lb 1.6 oz (59.92 kg)  BMI 23.41 kg/m2  General: NAD Cardiac: RRR, normal heart sounds, no murmurs. 2+ radial pulses bilaterally Respiratory: CTAB, normal effort. No wheezes/rhonchi/crackles present. Deep inspiration induces cough Skin: a few hyperpigmented macules present on palms/fingers of hands bilaterally, dorsum of MCP and DIP joints hyperpigmented bilaterally.   Assessment & Plan:  See Problem List Documentation

## 2014-03-08 NOTE — Telephone Encounter (Signed)
Called to discuss recent labs and blood cultures.   Patient feels much better, planning to come in for appt today.  Her HIV and RPR are negative so no explanation for her rash at this point. Her blood culture grew coag negative staph in 1/2 indicating staph epidermidis which is likely a contaminate.   Laroy Apple, MD [place] Resident, PGY-2 03/08/2014, 9:13 AM

## 2014-03-08 NOTE — Assessment & Plan Note (Signed)
Negative RPR and HIV testing. 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

## 2014-03-08 NOTE — Assessment & Plan Note (Signed)
Intermittent non-productive cough still present. Lung exam normal. Finished azithromycin course, discussed return precautions but otherwise follow up as needed.

## 2014-03-10 LAB — CULTURE, BLOOD (ROUTINE X 2): Culture: NO GROWTH

## 2014-04-22 ENCOUNTER — Encounter (HOSPITAL_COMMUNITY): Payer: Self-pay | Admitting: Emergency Medicine

## 2014-04-22 ENCOUNTER — Emergency Department (HOSPITAL_COMMUNITY)
Admission: EM | Admit: 2014-04-22 | Discharge: 2014-04-22 | Disposition: A | Discharge status: Home / Self Care | Payer: Self-pay | Attending: Emergency Medicine | Admitting: Emergency Medicine

## 2014-04-22 DIAGNOSIS — IMO0001 Reserved for inherently not codable concepts without codable children: Secondary | ICD-10-CM | POA: Insufficient documentation

## 2014-04-22 DIAGNOSIS — R5383 Other fatigue: Secondary | ICD-10-CM

## 2014-04-22 DIAGNOSIS — I10 Essential (primary) hypertension: Secondary | ICD-10-CM | POA: Insufficient documentation

## 2014-04-22 DIAGNOSIS — R5381 Other malaise: Secondary | ICD-10-CM | POA: Insufficient documentation

## 2014-04-22 DIAGNOSIS — J069 Acute upper respiratory infection, unspecified: Secondary | ICD-10-CM | POA: Insufficient documentation

## 2014-04-22 DIAGNOSIS — F17200 Nicotine dependence, unspecified, uncomplicated: Secondary | ICD-10-CM | POA: Insufficient documentation

## 2014-04-22 MED ORDER — FLUTICASONE PROPIONATE 50 MCG/ACT NA SUSP: 2.0000 | Freq: Every day | NASAL | Status: DC

## 2014-04-22 NOTE — Discharge Instructions (Signed)
Use nasal spray as directed. Rest, stay well hydrated. Continue over the counter medications. Follow up with your primary care doctor.  Cool Mist Vaporizers Vaporizers may help relieve the symptoms of a cough and cold. They add moisture to the air, which helps mucus to become thinner and less sticky. This makes it easier to breathe and cough up secretions. Cool mist vaporizers do not cause serious burns like hot mist vaporizers ("steamers, humidifiers"). Vaporizers have not been proved to show they help with colds. You should not use a vaporizer if you are allergic to mold.  HOME CARE INSTRUCTIONS  Follow the package instructions for the vaporizer.  Do not use anything other than distilled water in the vaporizer.  Do not run the vaporizer all of the time. This can cause mold or bacteria to grow in the vaporizer.  Clean the vaporizer after each time it is used.  Clean and dry the vaporizer well before storing it.  Stop using the vaporizer if worsening respiratory symptoms develop. Document Released: 09/09/2004 Document Revised: 08/15/2013 Document Reviewed: 05/02/2013 [facility] Patient Information 2014 [place], [place].  Upper Respiratory Infection, Adult An upper respiratory infection (URI) is also sometimes known as the common cold. The upper respiratory tract includes the nose, sinuses, throat, trachea, and bronchi. Bronchi are the airways leading to the lungs. Most people improve within 1 week, but symptoms can last up to 2 weeks. A residual cough may last even longer.  CAUSES Many different viruses can infect the tissues lining the upper respiratory tract. The tissues become irritated and inflamed and often become very moist. Mucus production is also common. A cold is contagious. You can easily spread the virus to others by oral contact. This includes kissing, sharing a glass, coughing, or sneezing. Touching your mouth or nose and then touching a surface, which is then touched by another  person, can also spread the virus. SYMPTOMS  Symptoms typically develop 1 to 3 days after you come in contact with a cold virus. Symptoms vary from person to person. They may include:  Runny nose.  Sneezing.  Nasal congestion.  Sinus irritation.  Sore throat.  Loss of voice (laryngitis).  Cough.  Fatigue.  Muscle aches.  Loss of appetite.  Headache.  Low-grade fever. DIAGNOSIS  You might diagnose your own cold based on familiar symptoms, since most people get a cold 2 to 3 times a year. Your caregiver can confirm this based on your exam. Most importantly, your caregiver can check that your symptoms are not due to another disease such as strep throat, sinusitis, pneumonia, asthma, or epiglottitis. Blood tests, throat tests, and X-rays are not necessary to diagnose a common cold, but they may sometimes be helpful in excluding other more serious diseases. Your caregiver will decide if any further tests are required. RISKS AND COMPLICATIONS  You may be at risk for a more severe case of the common cold if you smoke cigarettes, have chronic heart disease (such as heart failure) or lung disease (such as asthma), or if you have a weakened immune system. The very young and very old are also at risk for more serious infections. Bacterial sinusitis, middle ear infections, and bacterial pneumonia can complicate the common cold. The common cold can worsen asthma and chronic obstructive pulmonary disease (COPD). Sometimes, these complications can require emergency medical care and may be life-threatening. PREVENTION  The best way to protect against getting a cold is to practice good hygiene. Avoid oral or hand contact with people with cold symptoms.  Wash your hands often if contact occurs. There is no clear evidence that vitamin C, vitamin E, echinacea, or exercise reduces the chance of developing a cold. However, it is always recommended to get plenty of rest and practice good  nutrition. TREATMENT  Treatment is directed at relieving symptoms. There is no cure. Antibiotics are not effective, because the infection is caused by a virus, not by bacteria. Treatment may include:  Increased fluid intake. Sports drinks offer valuable electrolytes, sugars, and fluids.  Breathing heated mist or steam (vaporizer or shower).  Eating chicken soup or other clear broths, and maintaining good nutrition.  Getting plenty of rest.  Using gargles or lozenges for comfort.  Controlling fevers with ibuprofen or acetaminophen as directed by your caregiver.  Increasing usage of your inhaler if you have asthma. Zinc gel and zinc lozenges, taken in the first 24 hours of the common cold, can shorten the duration and lessen the severity of symptoms. Pain medicines may help with fever, muscle aches, and throat pain. A variety of non-prescription medicines are available to treat congestion and runny nose. Your caregiver can make recommendations and may suggest nasal or lung inhalers for other symptoms.  HOME CARE INSTRUCTIONS   Only take over-the-counter or prescription medicines for pain, discomfort, or fever as directed by your caregiver.  Use a warm mist humidifier or inhale steam from a shower to increase air moisture. This may keep secretions moist and make it easier to breathe.  Drink enough water and fluids to keep your urine clear or pale yellow.  Rest as needed.  Return to work when your temperature has returned to normal or as your caregiver advises. You may need to stay home longer to avoid infecting others. You can also use a face mask and careful hand washing to prevent spread of the virus. SEEK MEDICAL CARE IF:   After the first few days, you feel you are getting worse rather than better.  You need your caregiver's advice about medicines to control symptoms.  You develop chills, worsening shortness of breath, or brown or red sputum. These may be signs of  pneumonia.  You develop yellow or brown nasal discharge or pain in the face, especially when you bend forward. These may be signs of sinusitis.  You develop a fever, swollen neck glands, pain with swallowing, or white areas in the back of your throat. These may be signs of strep throat. SEEK IMMEDIATE MEDICAL CARE IF:   You have a fever.  You develop severe or persistent headache, ear pain, sinus pain, or chest pain.  You develop wheezing, a prolonged cough, cough up blood, or have a change in your usual mucus (if you have chronic lung disease).  You develop sore muscles or a stiff neck. Document Released: 06/08/2001 Document Revised: 03/06/2012 Document Reviewed: 04/16/2011 [facility] Patient Information 2014 [place], [place].

## 2014-04-22 NOTE — ED Provider Notes (Signed)
CSN: [id number]     Arrival date & time 04/22/14  1240 History  This chart was scribed for non-physician practitioner, Michele Mcalpine, PA-C, working with Wandra Arthurs, MD, by Sydell Axon, ED Scribe. This patient was seen in room TR08C/TR08C and the patient's care was started at 2:07 PM.  The history is provided by the patient. No language interpreter was used.   HPI Comments: Darlene Mcconnell is a 58 y.o. female who presents to the Emergency Department with a chief complaint of fever, and a dry cough with associated nasal congestion SOB; onset three days ago. She states her symptoms have been constant and non changing. She additionally states she has had myalgias intermittently. Patient denies any wheezes, nausea, vomiting, or diarrhea. Patient has tried taking OTC medication with no relief. She denies any recent sick contacts or travels. Patient works as a [employer]arry manager.   Past Medical History  Diagnosis Date  . Hypertension   . Migraine   . Intracranial aneurysm 2013    left   Past Surgical History  Procedure Laterality Date  . No past surgeries    . Craniotomy  08/17/2012    Procedure: CRANIOTOMY INTRACRANIAL ANEURYSM FOR CAROTID;  Surgeon: Winfield Cunas, MD;  Location: [facility];  Service: Neurosurgery;  Laterality: Left;  Craniotomy for Aneurysm Clipping   History reviewed. No pertinent family history. History  Substance Use Topics  . Smoking status: Current Every Day Smoker -- 0.25 packs/day for 12 years    Types: Cigarettes  . Smokeless tobacco: Not on file  . Alcohol Use: Yes     Comment: Beer (3-4) every night when she gets off work.    OB History   Grav Para Term Preterm Abortions TAB SAB Ect Mult Living   3 2 2  1 1    2      Review of Systems  Constitutional: Positive for fatigue. Negative for fever and chills.  Respiratory: Positive for cough and shortness of breath. Negative for wheezing.   Cardiovascular: Positive for chest pain.  Gastrointestinal: Negative for nausea,  vomiting, abdominal pain and diarrhea.  Musculoskeletal: Positive for myalgias.  Neurological: Negative for dizziness, weakness and headaches.  A complete 10 system review of systems was obtained and all systems are negative except as noted in the HPI and PMH.   Allergies  Review of patient's allergies indicates no known allergies.  Home Medications   Prior to Admission medications   Not on File   Triage Vitals: BP 152/98  Pulse 74  Temp(Src) 99 F (37.2 C) (Oral)  Resp 18  SpO2 98%  Physical Exam  Nursing note and vitals reviewed. Constitutional: She is oriented to person, place, and time. She appears well-developed and well-nourished. No distress.  HENT:  Head: Normocephalic and atraumatic.  Nose: Mucosal edema present.  Mouth/Throat: Posterior oropharyngeal erythema present. No oropharyngeal exudate or posterior oropharyngeal edema.  Post-nasal drip. Nasal congestion.  Eyes: Conjunctivae and EOM are normal.  Neck: Normal range of motion. Neck supple.  Cardiovascular: Normal rate, regular rhythm and normal heart sounds.   Pulmonary/Chest: Effort normal and breath sounds normal. No respiratory distress.  Musculoskeletal: Normal range of motion. She exhibits no edema.  Lymphadenopathy:  No adenopathy.  Neurological: She is alert and oriented to person, place, and time. No sensory deficit.  Skin: Skin is warm and dry.  Psychiatric: She has a normal mood and affect. Her behavior is normal.    ED Course  Procedures (including critical care time)  COORDINATION  OF CARE: 2:10 PM-Discussed my low suspicion of strep throat. Discussed the likelihood of an URI. Will prescribe Flonase. Encouraged patient to get plenty of rest and to keep well hydrated. Treatment plan discussed with patient and patient agrees.  MDM   Final diagnoses:  URI (upper respiratory infection)    Patient well-appearing and in no apparent distress. Vital signs stable. Lungs clear. Mucosal edema,  postnasal drip, nasal congestion and oropharyngeal erythema without edema or exudate on exam. Swallows secretions well. Discussed symptomatic treatment. Will prescribe Flonase. Stable for discharge. Return precautions given. Patient states understanding of treatment care plan and is agreeable.   I personally performed the services described in this documentation, which was scribed in my presence. The recorded information has been reviewed and is accurate.    Illene Labrador, PA-C 04/22/14 1419

## 2014-04-22 NOTE — ED Notes (Signed)
Pt reports fever cough congestion for the past 3 days. Reports that she has also had generalized fatigue.

## 2014-04-23 NOTE — ED Provider Notes (Signed)
Medical screening examination/treatment/procedure(s) were performed by non-physician practitioner and as supervising physician I was immediately available for consultation/collaboration.   EKG Interpretation None        David H Yao, MD 04/23/14 0709 

## 2014-10-28 ENCOUNTER — Encounter (HOSPITAL_COMMUNITY): Payer: Self-pay | Admitting: Emergency Medicine

## 2015-03-20 ENCOUNTER — Ambulatory Visit
Admission: RE | Admit: 2015-03-20 | Discharge: 2015-03-20 | Disposition: A | Discharge status: Home / Self Care | Payer: No Typology Code available for payment source | Source: Ambulatory Visit | Attending: Family Medicine | Admitting: Family Medicine

## 2015-03-20 ENCOUNTER — Ambulatory Visit: Payer: Self-pay

## 2015-03-20 ENCOUNTER — Ambulatory Visit (INDEPENDENT_AMBULATORY_CARE_PROVIDER_SITE_OTHER): Payer: Self-pay | Admitting: Family Medicine

## 2015-03-20 ENCOUNTER — Encounter: Payer: Self-pay | Admitting: Family Medicine

## 2015-03-20 VITALS — BP 120/68 | HR 72 | Temp 98.5°F | Ht 63.0 in | Wt 128.0 lb

## 2015-03-20 DIAGNOSIS — I10 Essential (primary) hypertension: Secondary | ICD-10-CM

## 2015-03-20 DIAGNOSIS — M545 Low back pain, unspecified: Secondary | ICD-10-CM

## 2015-03-20 DIAGNOSIS — R202 Paresthesia of skin: Secondary | ICD-10-CM

## 2015-03-20 DIAGNOSIS — M25511 Pain in right shoulder: Secondary | ICD-10-CM

## 2015-03-20 DIAGNOSIS — R05 Cough: Secondary | ICD-10-CM

## 2015-03-20 DIAGNOSIS — I951 Orthostatic hypotension: Secondary | ICD-10-CM | POA: Insufficient documentation

## 2015-03-20 DIAGNOSIS — R058 Other specified cough: Secondary | ICD-10-CM

## 2015-03-20 LAB — BASIC METABOLIC PANEL
BUN: 13 mg/dL (ref 6–23)
CALCIUM: 10.5 mg/dL (ref 8.4–10.5)
CO2: 27 mEq/L (ref 19–32)
Chloride: 101 mEq/L (ref 96–112)
Creat: 0.79 mg/dL (ref 0.50–1.10)
GLUCOSE: 71 mg/dL (ref 70–99)
Potassium: 4.4 mEq/L (ref 3.5–5.3)
Sodium: 140 mEq/L (ref 135–145)

## 2015-03-20 MED ORDER — NAPROXEN 500 MG PO TABS
500.0000 mg | ORAL_TABLET | Freq: Two times a day (BID) | ORAL | Status: DC
Start: 2015-03-20 — End: 2015-04-01

## 2015-03-20 MED ORDER — LISINOPRIL 10 MG PO TABS: 10.0000 mg | ORAL_TABLET | Freq: Every day | ORAL | Status: DC

## 2015-03-20 NOTE — Patient Instructions (Signed)
Nice to meet you. You need to drink more water and stand up slowly when rising.  If your light headedness gets worse, you need to return to clinic. If you develop worsening back pain, numbness, weakness, loss of bowel or bladder function, or fever please seek medical attention. Please complete the exercises for you shoulder and back twice a day. We will treat your discomfort with naproxen.

## 2015-03-20 NOTE — Progress Notes (Signed)
Patient ID: Darlene Mcconnell, female   DOB: 07-Dec-1956, 59 y.o.   MRN: [id number]  Tommi Rumps, MD Phone: [phone]  Darlene Mcconnell is a 59 y.o. female who presents today for f/u.  Right shoulder pain: notes pain in right shoulder for the past month. Tightness in right trap. Popping in shoulder. Hurts in certain positions such as lifting arm to side and placing behind her head. No pain with reaching behind her back. Notes intermittent tingling in right fingers depending on the position of her arm. No injury to the area. No previous history of this. She is a CNA and lifts patients at work. Has been taking 6-7 tablets of ibuprofen daily.   Back pain: for the last week. Is low back pain. Feels this when she walks. No injury. Notes previous injury to back years ago that improved with PT. Notes tingling in lateral toes intermittently. No numbness or weakness. No incontinence. No fever. No saddle anesthesia. No history of cancer.   Light headedness: for the past 1.5 weeks. Occurs only after she stands up. Is intermittent. No vertigo. No ear fullness. No congestion, vision changes, or recent illnesses. Reports has been out of lisinopril for 5-6 months. Notes the light headedness goes away after she has adjusted on standing up. Notes she does not drink much water and typically only drinks sodas.  Dry cough: notes this for several weeks. No dyspnea. No production to the cough. No congestion, rhinorrhea, postnasal drip, watery eyes, reflux symptoms. She does note she smokes cigarettes and marijuana.   PMH: smoker.   ROS: Per HPI   Physical Exam Filed Vitals:   03/20/15 0851  BP: 120/68  Pulse: 72  Temp: 98.5 F (36.9 C)    Gen: Well NAD HEENT: PERRL,  MMM Lungs: CTABL Nl WOB Heart: RRR  MSK: right shoulder with no TTP, no swelling, there is pain on external rotation and abduction actively and passively, she has full passive ROM, active ROM limited on abduction due to pain, left  shoulder exam normal, negative drop arm test, tightness noted in right trapezius, mild bilateral low back in SI joint area tenderness, no midline tenderness, no noted spasm, negative straight leg raise, negative spurlings Neuro: 5/5 strength in bilateral biceps, triceps, grip, quads, hamstrings, plantar and dorsiflexion, sensation to light touch intact in bilateral UE and LE, normal gait, able to get on to exam table with no assistance Exts: Non edematous BL  LE, warm and well perfused.    Assessment/Plan: Please see individual problem list.  Tommi Rumps, MD [place] PGY-3

## 2015-03-20 NOTE — Assessment & Plan Note (Signed)
Patient with recent onset of low back pain. No red flags on exam. Tingling does bring in to question possibility of impingement, though no neuro issues today. Negative straight leg raise makes sciatica less likely. Favor conservative treatment at this time with NSAIDs and back exercises. If not improving could consider XR lumbar spine or PT. Given return precautions.

## 2015-03-20 NOTE — Assessment & Plan Note (Signed)
Dry cough likely related to smoking cigarettes. No associated dyspnea. No fever and has normal lung exam making PNA unlikely cause. Doubt viral illness with no associated symptoms. No allergic rhinitis symptoms. No GERD symptoms. Advised on smoking cessation.

## 2015-03-20 NOTE — Assessment & Plan Note (Addendum)
Patient with normal BP on first check. On orthostatics BP elevated and revealed evidence of orthostasis with >20 point drop on systolic BP. This is quite an issue with her BP being significantly elevated on orthostatics, though she is orthostatic which is the likely cause of her light headedness. Will need to treat her blood pressure as it was quite high on the first orthostatic check and will restart lisinopril, though this could make light headedness worse and patient was advised of this. BMET today. Discussed that she needs to rise slowly and drink more liquids that do not include soda. She is to follow-up in 2 weeks with her PCP for further management of her BP and to see if her orthostasis has improved. Given return precautions.

## 2015-03-20 NOTE — Assessment & Plan Note (Addendum)
Pain in right shoulder likely related to rotator cuff impingement based on exam. Reports some tingling intermittently in fingers bringing in to question whether there could be an impingement in her neck. Neurologically she is intact at this time. Will check cervical XR given tingling. Will start on naproxen 500 mg BID for 5 days then as needed for her shoulder discomfort. Gave exercises for rotator cuff injury. Given return precautions.

## 2015-03-25 ENCOUNTER — Encounter: Payer: Self-pay | Admitting: Family Medicine

## 2015-03-26 ENCOUNTER — Telehealth: Payer: Self-pay | Admitting: Family Medicine

## 2015-03-26 DIAGNOSIS — R202 Paresthesia of skin: Secondary | ICD-10-CM

## 2015-03-26 NOTE — Telephone Encounter (Signed)
Called patient to discuss results of XR neck. It revealed mild diffuse arthritic changes. Patient reports that she has continued to have tingling in her right hand mostly associated with the pain in her right shoulder. I advised that I would like for her to do PT to help with this. I also discussed that we could obtain an MRI of her cervical spine to further evaluate this tingling vs continuing to monitor. She choose to obtain the MRI of her cervical spine. Will place this order and have blue team nurses schedule this for the patient.

## 2015-03-27 NOTE — Telephone Encounter (Signed)
Spoke with patient and she would just to go ahead and have the MRI done.  She is currently out of town and will not be back until after April the 10th.  Informed patient that I would call and get this scheduled for after that date and leave information on her VM if she doesn't answer.  Pt is fine with this and would like her appt before 12pm. Zachariah Pavek,CMA

## 2015-03-31 NOTE — Telephone Encounter (Signed)
[facility] is aware of this order.  They will contact patient to get this scheduled since they have to ask screening questions first. [facility]

## 2015-03-31 NOTE — Telephone Encounter (Signed)
Noted  

## 2015-04-01 ENCOUNTER — Other Ambulatory Visit: Payer: Self-pay | Admitting: Family Medicine

## 2015-04-22 ENCOUNTER — Ambulatory Visit: Payer: Medicaid Other | Attending: Family Medicine | Admitting: Physical Therapy

## 2015-04-22 DIAGNOSIS — R208 Other disturbances of skin sensation: Secondary | ICD-10-CM | POA: Diagnosis not present

## 2015-04-22 DIAGNOSIS — R29898 Other symptoms and signs involving the musculoskeletal system: Secondary | ICD-10-CM | POA: Diagnosis not present

## 2015-04-22 DIAGNOSIS — M25511 Pain in right shoulder: Secondary | ICD-10-CM | POA: Insufficient documentation

## 2015-04-22 DIAGNOSIS — R209 Unspecified disturbances of skin sensation: Secondary | ICD-10-CM
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
## 2015-04-24 ENCOUNTER — Other Ambulatory Visit: Payer: Self-pay

## 2015-04-29 ENCOUNTER — Ambulatory Visit: Payer: Medicaid Other | Attending: Family Medicine | Admitting: Physical Therapy

## 2015-04-29 DIAGNOSIS — M25511 Pain in right shoulder: Secondary | ICD-10-CM | POA: Insufficient documentation

## 2015-04-29 DIAGNOSIS — R29898 Other symptoms and signs involving the musculoskeletal system: Secondary | ICD-10-CM | POA: Insufficient documentation

## 2015-04-29 DIAGNOSIS — R208 Other disturbances of skin sensation: Secondary | ICD-10-CM | POA: Insufficient documentation

## 2015-05-01 ENCOUNTER — Ambulatory Visit: Payer: Medicaid Other | Admitting: Physical Therapy

## 2015-05-01 DIAGNOSIS — R29898 Other symptoms and signs involving the musculoskeletal system: Secondary | ICD-10-CM

## 2015-05-01 DIAGNOSIS — R208 Other disturbances of skin sensation: Secondary | ICD-10-CM | POA: Diagnosis not present

## 2015-05-01 DIAGNOSIS — M25511 Pain in right shoulder: Secondary | ICD-10-CM | POA: Diagnosis present

## 2015-05-01 DIAGNOSIS — R209 Unspecified disturbances of skin sensation: Secondary | ICD-10-CM

## 2015-05-01 NOTE — Patient Instructions (Signed)
   Hold yellow theraband in hands while lying on back.  Tuck elbows into side and keep elbows at a 90 degree angle.  Pull fists down toward bed while keeping elbows into side.  Repeat 10 times.  Do 2 times a day, 7 days a week.

## 2015-05-01 NOTE — Therapy (Signed)
[place], [place], [address] Phone: [phone]   Fax:  [phone]  Physical Therapy Treatment  Patient Details  Name: Darlene Mcconnell MRN: [id number] Date of Birth: [birth year] Referring Provider:  Leone Brand, MD  Encounter Date: 05/01/2015      PT End of Session - 05/01/15 0803    Visit Number 2   Number of Visits 12   Date for PT Re-Evaluation 06/03/15   PT Start Time 0802   PT Stop Time 0852   PT Time Calculation (min) 50 min      Past Medical History  Diagnosis Date  . Hypertension   . Migraine   . Intracranial aneurysm 2013    left    Past Surgical History  Procedure Laterality Date  . No past surgeries    . Craniotomy  08/17/2012    Procedure: CRANIOTOMY INTRACRANIAL ANEURYSM FOR CAROTID;  Surgeon: Winfield Cunas, MD;  Location: [place] [facility];  Service: Neurosurgery;  Laterality: Left;  Craniotomy for Aneurysm Clipping    There were no vitals filed for this visit.  Visit Diagnosis:  Shoulder pain, right  Weakness of shoulder  Sensory disturbance      Subjective Assessment - 05/01/15 0803    Subjective I have a lot of discomfort from my pain.  It's just always there and is annoying.  5/10 pain before treatment and the pt. reported relief after treatment and application of hot pack.  She reports still having trouble sleeping but did sleep well after her evaluation due to the hot pack.     Currently in Pain? Yes   Pain Score 5    Pain Descriptors / Indicators Throbbing                         [facility] Adult PT Treatment/Exercise - 05/01/15 0825    Shoulder Exercises: Supine   Horizontal ABduction 15 reps  10 reps with theraband   Theraband Level (Shoulder Horizontal ABduction) Level 1 (Yellow)   External Rotation Strengthening;10 reps;Theraband   Theraband Level (Shoulder External Rotation) Level 1 (Yellow)   Internal Rotation Strengthening;10  reps;Theraband;Right   Theraband Level (Shoulder Internal Rotation) Level 1 (Yellow)   Flexion 20 reps  10 reps with therband   Theraband Level (Shoulder Flexion) Level 1 (Yellow)  cues for proper technique   Other Supine Exercises cane punches and pullovers x 10 each; abduction x 5 (pt. c/o pain with abduction)   Shoulder Exercises: Seated   Other Seated Exercises shoulder squeezes x 15   Shoulder Exercises: Standing   Extension Strengthening;10 reps;Theraband   Theraband Level (Shoulder Extension) Level 1 (Yellow)   Row Strengthening;10 reps;Theraband   Theraband Level (Shoulder Row) Level 1 (Yellow)  cues to squeeze shoulder blades together   Modalities   Modalities Moist Heat   Moist Heat Therapy   Number Minutes Moist Heat 10 Minutes   Moist Heat Location Shoulder  rt   Manual Therapy   Manual Therapy Passive ROM   Passive ROM supine all planes  pain w/ abduction                PT Education - 05/01/15 0854    Education Details standing Shoulder extension, rows, supine horizontal abduction with yellow theraband HEP   Person(s) Educated Patient   Methods Explanation;Demonstration;Handout   Comprehension Verbalized understanding;Returned demonstration;Tactile cues required;Verbal cues required          PT Short Term  Goals - 04/22/15 1414    PT SHORT TERM GOAL #1   Title STG=LTG           PT Long Term Goals - 04/22/15 1414    PT LONG TERM GOAL #1   Title Pt wil be able to demo, understand posture and body mechanics to prevent reinjury.    Time 6   Period Weeks   Status New   PT LONG TERM GOAL #2   Title Pt will be able to reach overhead with min occasional pain   Time 6   Period Weeks   Status New   PT LONG TERM GOAL #3   Title Pt will  be able to lift, carry grocery bags without increase in pain.    Time 6   Period Weeks   Status New   PT LONG TERM GOAL #4   Title Pt will be able to demo functional improvement to 32% or less as evidenced by  FOTO   Time 6   Period Weeks   Status New               Plan - 05/01/15 0211    Clinical Impression Statement The pt. had 5/10 throbbing pain in shoulder upon arrival.  She was able to begin supine shoulder exercises with a yellow theraband and standing rows and shoulder extension with yellow theraband.  No pain increase during treatment, she reported that she could tell that she was weak and her muscles were working.     PT Next Visit Plan  modalities for pain. R/O C-spine (limited time on eval).  strengthening         Problem List Patient Active Problem List   Diagnosis Date Noted  . Orthostatic hypotension 03/20/2015  . Right shoulder pain 03/20/2015  . Low back pain 03/20/2015  . Dry cough 03/20/2015  . Rash of hands 03/05/2014  . Concern about STD in female without diagnosis 03/05/2014  . Positive blood culture 03/05/2014  . Sepsis 03/04/2014  . Chest pain 03/04/2014  . Essential hypertension, benign 03/04/2014  . CAP (community acquired pneumonia) 03/03/2014    Jaren Vanetten,[place], [place] 05/01/2015, 9:05 AM  [facility] [address] [place], [place], [address] Phone: [phone]   Fax:  [phone]

## 2015-05-06 ENCOUNTER — Ambulatory Visit: Payer: Medicaid Other | Admitting: Physical Therapy

## 2015-05-06 DIAGNOSIS — R209 Unspecified disturbances of skin sensation: Secondary | ICD-10-CM

## 2015-05-06 DIAGNOSIS — R29898 Other symptoms and signs involving the musculoskeletal system: Secondary | ICD-10-CM

## 2015-05-06 DIAGNOSIS — M25511 Pain in right shoulder: Secondary | ICD-10-CM | POA: Diagnosis not present

## 2015-05-06 NOTE — Therapy (Addendum)
[place], [place], [address] Phone: [phone]   Fax:  [phone]  Physical Therapy Treatment  Patient Details  Name: Darlene Mcconnell MRN: [id number] Date of Birth: 15-May-1956 Referring Provider:  Leone Brand, MD  Encounter Date: 05/06/2015      PT End of Session - 05/06/15 0928    Visit Number 3   Number of Visits 12   Date for PT Re-Evaluation 06/03/15   PT Start Time 0846   PT Stop Time 0933   PT Time Calculation (min) 47 min   Activity Tolerance Patient tolerated treatment well      Past Medical History  Diagnosis Date  . Hypertension   . Migraine   . Intracranial aneurysm 2013    left    Past Surgical History  Procedure Laterality Date  . No past surgeries    . Craniotomy  08/17/2012    Procedure: CRANIOTOMY INTRACRANIAL ANEURYSM FOR CAROTID;  Surgeon: Winfield Cunas, MD;  Location: [place] [facility];  Service: Neurosurgery;  Laterality: Left;  Craniotomy for Aneurysm Clipping    There were no vitals filed for this visit.  Visit Diagnosis:  Shoulder pain, right  Weakness of shoulder  Sensory disturbance      Subjective Assessment - 05/06/15 0849    Subjective Sleeping better, feel less pain overall, less sensory changes   Currently in Pain? Yes   Pain Score 3    Pain Location Scapula   Pain Orientation Right;Posterior   Pain Descriptors / Indicators Aching   Pain Type Chronic pain   Pain Onset More than a month ago          [facility] Adult PT Treatment/Exercise - 05/06/15 0857    Exercises   Exercises --  UBE level 1 5 min cues for posture   Shoulder Exercises: Supine   Horizontal ABduction 15 reps  10 reps with theraband   Theraband Level (Shoulder Horizontal ABduction) Level 1 (Yellow)   External Rotation Strengthening;10 reps;Theraband   Theraband Level (Shoulder External Rotation) Level 1 (Yellow)   Internal Rotation Strengthening;10 reps;Theraband;Right   Theraband Level (Shoulder Internal Rotation) Level 1 (Yellow)   Flexion 20 reps  10 reps with therband   Theraband Level (Shoulder Flexion) Level 1 (Yellow)  cues for proper technique   Shoulder Exercises: Standing   Extension Strengthening;10 reps;Theraband   Theraband Level (Shoulder Extension) Level 1 (Yellow)   Row Strengthening;10 reps;Theraband   Theraband Level (Shoulder Row) Level 1 (Yellow)  cues to squeeze shoulder blades together   Moist Heat Therapy   Number Minutes Moist Heat 10 Minutes   Moist Heat Location Shoulder  L   Manual Therapy   Manual Therapy Massage   Massage compression and trigger point work in clothing to L. periscapular mm   Passive ROM supine all planes  pain w/ abduction      Self care included use of tennis ball to release trigger points in neck and shoulders.  Gave her for home use.        PT Short Term Goals - 04/22/15 1414    PT SHORT TERM GOAL #1   Title STG=LTG           PT Long Term Goals - 05/06/15 0933    PT LONG TERM GOAL #1   Title Pt wil be able to demo, understand posture and body mechanics to prevent reinjury.    Status On-going   PT LONG TERM GOAL #2   Title  Pt will be able to reach overhead with min occasional pain   Status On-going   PT LONG TERM GOAL #3   Title Pt will  be able to lift, carry grocery bags without increase in pain.    Status On-going   PT LONG TERM GOAL #4   Title Pt will be able to demo functional improvement to 32% or less as evidenced by FOTO   Status On-going               Plan - 05/06/15 1740    Clinical Impression Statement Pt improving with pain and function.  She is still having a MRI tomorrow for C spine despite improvement. She admits to being "out of shape" and expresses an interest in group classes for health and wellness.    PT Next Visit Plan manual,  strengthening    Consulted and Agree with Plan of Care Patient        Problem List Patient Active Problem List    Diagnosis Date Noted  . Orthostatic hypotension 03/20/2015  . Right shoulder pain 03/20/2015  . Low back pain 03/20/2015  . Dry cough 03/20/2015  . Rash of hands 03/05/2014  . Concern about STD in female without diagnosis 03/05/2014  . Positive blood culture 03/05/2014  . Sepsis 03/04/2014  . Chest pain 03/04/2014  . Essential hypertension, benign 03/04/2014  . CAP (community acquired pneumonia) 03/03/2014    PAA,JENNIFER 05/06/2015, 9:35 AM  [place], [place], [address] Phone: [phone]   Fax:  [phone]   PHYSICAL THERAPY DISCHARGE SUMMARY  Visits from Start of Care: 3 Current functional level related to goals / functional outcomes: See above for goals met   Remaining deficits: Unknown   Education / Equipment: HEP Plan: Patient agrees to discharge.  Patient goals were not met. Patient is being discharged due to not returning since the last visit.  ?????    Raeford Razor, PT 11/06/2015 12:02 PM Phone: [phone] Fax: [phone]

## 2015-05-07 ENCOUNTER — Ambulatory Visit
Admission: RE | Admit: 2015-05-07 | Discharge: 2015-05-07 | Disposition: A | Discharge status: Home / Self Care | Payer: No Typology Code available for payment source | Source: Ambulatory Visit | Attending: Family Medicine | Admitting: Family Medicine

## 2015-05-07 DIAGNOSIS — R202 Paresthesia of skin: Secondary | ICD-10-CM

## 2015-05-08 ENCOUNTER — Ambulatory Visit: Payer: Medicaid Other | Admitting: Physical Therapy

## 2015-05-09 ENCOUNTER — Telehealth: Payer: Self-pay | Admitting: Family Medicine

## 2015-05-09 NOTE — Telephone Encounter (Signed)
Called patient to discuss results of MRI. Advised on single image that could be artifact vs demyelinating disease. Pain states she is still having pain and intermittent paresthesias. No numbness or weakness at this time. Notes pain has been present since March. Has also had numbness and tingling for the last 6 months intermittently. She also reports intermittent blurring of vision, though none at this time. She does note symptoms have progressively gotten worse. Has a cousin with MS. I outlined the options for the patient. These include repeating the cervical spine MRI to determine if the area is an artifact or a true lesion vs obtaining MRI brain to evaluate for lesions consistent with MS vs referral to neurology. I advised that given the intermittent symptoms over the past 6 months and vision issues with a family history of MS we would need to start with one of these options and then progress based on what we found. She asked to think about it over the weekend and I advised that this was reasonable. She is to call back on Monday to give [place] her decision on what step she wants to take next. Advised of reasons to go to the ED. Will await her decision on the next step.

## 2015-05-13 ENCOUNTER — Ambulatory Visit: Payer: Medicaid Other | Admitting: Physical Therapy

## 2015-05-13 DIAGNOSIS — M25511 Pain in right shoulder: Secondary | ICD-10-CM

## 2015-05-13 DIAGNOSIS — R209 Unspecified disturbances of skin sensation: Secondary | ICD-10-CM

## 2015-05-13 DIAGNOSIS — R29898 Other symptoms and signs involving the musculoskeletal system: Secondary | ICD-10-CM

## 2015-05-13 NOTE — Therapy (Signed)
[place], [place], [address] Phone: [phone]   Fax:  [phone]  Physical Therapy Treatment  Patient Details  Name: Darlene Mcconnell MRN: [id number] Date of Birth: Jul 08, 1956 Referring Provider:  Leone Brand, MD  Encounter Date: 05/13/2015      PT End of Session - 05/13/15 0901    Visit Number 4   Number of Visits 12   Date for PT Re-Evaluation 06/03/15   PT Start Time 7628   PT Stop Time 0928   PT Time Calculation (min) 33 min      Past Medical History  Diagnosis Date  . Hypertension   . Migraine   . Intracranial aneurysm 2013    left    Past Surgical History  Procedure Laterality Date  . No past surgeries    . Craniotomy  08/17/2012    Procedure: CRANIOTOMY INTRACRANIAL ANEURYSM FOR CAROTID;  Surgeon: Winfield Cunas, MD;  Location: [place] [facility];  Service: Neurosurgery;  Laterality: Left;  Craniotomy for Aneurysm Clipping    There were no vitals filed for this visit.  Visit Diagnosis:  Shoulder pain, right  Weakness of shoulder  Sensory disturbance      Subjective Assessment - 05/13/15 0859    Subjective Mild throbbing at rest. Much better than it was. Still pain with reaching. I avoid carrying anything with my right arm.    Currently in Pain? Yes   Pain Score 2    Aggravating Factors  reaching up, lifting/carrying.    Pain Relieving Factors heat, prop up on pillows                         [facility] Adult PT Treatment/Exercise - 05/13/15 0906    Shoulder Exercises: Standing   Extension Strengthening;10 reps;Theraband   Theraband Level (Shoulder Extension) Level 1 (Yellow)   Row Strengthening;10 reps;Theraband   Theraband Level (Shoulder Row) Level 1 (Yellow)  cues to squeeze shoulder blades together   Row Limitations needs tactile cues for scap squeeze   Shoulder Exercises: Pulleys   Flexion 2 minutes   ABduction 1 minute   Shoulder Exercises:  ROM/Strengthening   Other ROM/Strengthening Exercises wall ladder flexion x 5 with pain with eccentrics.    Other ROM/Strengthening Exercises standing cane for flexion and abduction x 10 each                PT Education - 05/13/15 0927    Education provided Yes   Education Details standing cane flexion and abduction   Person(s) Educated Patient   Methods Explanation;Handout   Comprehension Verbalized understanding          PT Short Term Goals - 04/22/15 1414    PT SHORT TERM GOAL #1   Title STG=LTG           PT Long Term Goals - 05/13/15 0858    PT LONG TERM GOAL #1   Title Pt wil be able to demo, understand posture and body mechanics to prevent reinjury.    Time 6   Period Weeks   Status On-going   PT LONG TERM GOAL #2   Title Pt will be able to reach overhead with min occasional pain   Time 6   Period Weeks   Status On-going   PT LONG TERM GOAL #3   Title Pt will  be able to lift, carry grocery bags without increase in pain.    Time 6  Period Weeks   Status On-going   PT LONG TERM GOAL #4   Title Pt will be able to demo functional improvement to 32% or less as evidenced by FOTO   Time 6   Period Weeks   Status On-going               Plan - 05/13/15 0901    Clinical Impression Statement Pt reports MD wants her to redo the MRI due to a "spot" in her shoulder. Continued pain with reaching however pain at rest is decreasing. Pt was instructed in ROM exercises with cane to reduce pain with eccentric lowering. Pt issed this for HEP.    PT Next Visit Plan continue, manual, painfree ROM and strength, progress to red band        Problem List Patient Active Problem List   Diagnosis Date Noted  . Orthostatic hypotension 03/20/2015  . Right shoulder pain 03/20/2015  . Low back pain 03/20/2015  . Dry cough 03/20/2015  . Rash of hands 03/05/2014  . Concern about STD in female without diagnosis 03/05/2014  . Positive blood culture 03/05/2014  .  Sepsis 03/04/2014  . Chest pain 03/04/2014  . Essential hypertension, benign 03/04/2014  . CAP (community acquired pneumonia) 03/03/2014    Dorene Ar, PTA 05/13/2015, 9:30 AM  [facility] [address] [place], [place], [address] Phone: [phone]   Fax:  [phone]

## 2015-05-13 NOTE — Patient Instructions (Signed)
Flexion (Eccentric) - Active-Assist (Cane)   Use unaffected arm to push affected arm forward. Avoid hiking shoulder. Keep palm relaxed. Slowly lower affected arm for 3-5 seconds, increasing use of affected arm. __10_ reps per set, __2_ sets per day, _7__ days per week. Add ___ lbs when you achieve ___ repetitions.  Copyright  VHI. All rights reserved  Cane Exercise: Abduction   Hold cane with right hand over end, palm-up, with other hand palm-down. Move arm out from side and up by pushing with other arm. Hold __5__ seconds. Repeat _10___ times. Do _2___ sessions per day.  http://gt2.exer.us/81   Copyright  VHI. All rights reserved.      Cane Exercise: Extension / Internal Rotation   Stand holding cane behind back with both hands palm-up. Slide cane up spine toward head. Hold _5___ seconds. Repeat _10___ times. Do ____ 2sessions per day.  http://gt2.exer.us/85   Copyright  VHI. All rights reserved.  Darlene Mcconnell Exercise: Extension   Stand holding cane behind back with both hands palm-up. Lift the cane away from body. Hold 5____ seconds. Repeat _10___ times. Do _2___ sessions per day.  http://gt2.exer.us/83   Copyright  VHI. All rights reserved.

## 2015-05-15 ENCOUNTER — Ambulatory Visit: Payer: Medicaid Other | Admitting: Physical Therapy

## 2015-05-15 ENCOUNTER — Telehealth: Payer: Self-pay | Admitting: Family Medicine

## 2015-05-15 DIAGNOSIS — M25511 Pain in right shoulder: Secondary | ICD-10-CM

## 2015-05-15 DIAGNOSIS — R29898 Other symptoms and signs involving the musculoskeletal system: Secondary | ICD-10-CM

## 2015-05-15 DIAGNOSIS — R202 Paresthesia of skin: Secondary | ICD-10-CM

## 2015-05-15 DIAGNOSIS — R209 Unspecified disturbances of skin sensation: Secondary | ICD-10-CM

## 2015-05-15 NOTE — Telephone Encounter (Signed)
Spoke with patient and let her know that she would need to pick which scan she wanted done.  She request that the provider choose "since he knows best."  Per Dr. Caryl Bis a brain MRI would be more ideal.  Will forward to him to place order for this.  I then informed patient that she would be receiving a call from [facility] to assist her in scheduling this. Willard Farquharson,CMA

## 2015-05-15 NOTE — Therapy (Signed)
[place], [place], [address] Phone: [phone]   Fax:  [phone]  Physical Therapy Treatment  Patient Details  Name: Darlene Mcconnell MRN: [id number] Date of Birth: 05/18/1956 Referring Provider:  Leone Brand, MD  Encounter Date: 05/15/2015      PT End of Session - 05/15/15 0844    Visit Number 5   Number of Visits 12   Date for PT Re-Evaluation 06/03/15   PT Start Time 0805   PT Stop Time 0900   PT Time Calculation (min) 55 min      Past Medical History  Diagnosis Date  . Hypertension   . Migraine   . Intracranial aneurysm 2013    left    Past Surgical History  Procedure Laterality Date  . No past surgeries    . Craniotomy  08/17/2012    Procedure: CRANIOTOMY INTRACRANIAL ANEURYSM FOR CAROTID;  Surgeon: Winfield Cunas, MD;  Location: [place] [facility];  Service: Neurosurgery;  Laterality: Left;  Craniotomy for Aneurysm Clipping    There were no vitals filed for this visit.  Visit Diagnosis:  Shoulder pain, right  Weakness of shoulder  Sensory disturbance      Subjective Assessment - 05/15/15 0809    Subjective I woke up after sleeping on it, so did not sleep well.    Currently in Pain? Yes   Pain Score 5    Pain Location Shoulder  joint   Pain Descriptors / Indicators Throbbing  deep throbbing   Pain Type Chronic pain                         [facility] Adult PT Treatment/Exercise - 05/15/15 0821    Shoulder Exercises: Supine   Horizontal ABduction 15 reps   Theraband Level (Shoulder Horizontal ABduction) Level 2 (Red)   Horizontal ABduction Limitations then diagonals x 15 each side   External Rotation Strengthening;15 reps;Theraband   Theraband Level (Shoulder External Rotation) Level 2 (Red)   Internal Rotation Strengthening;Right;15 reps;Theraband   Theraband Level (Shoulder Internal Rotation) Level 2 (Red)   Shoulder Exercises: Standing   Flexion  Strengthening;Right;15 reps;Theraband  punch   Theraband Level (Shoulder Flexion) Level 2 (Red)   Extension Strengthening;20 reps;Theraband   Theraband Level (Shoulder Extension) Level 2 (Red)   Row Strengthening;Both;20 reps;Theraband   Theraband Level (Shoulder Row) Level 2 (Red)   Other Standing Exercises [place] Ranger exercises including assisted flexion, abduction, horizontal abdct/ addct, circles x 10 each with tactile cues to decrease shoulder hike   Other Standing Exercises elbows on wall scapular retract/protract with tactile cues for scapular depression.    Moist Heat Therapy   Number Minutes Moist Heat 15 Minutes   Moist Heat Location Shoulder  L                  PT Short Term Goals - 04/22/15 1414    PT SHORT TERM GOAL #1   Title STG=LTG           PT Long Term Goals - 05/13/15 0858    PT LONG TERM GOAL #1   Title Pt wil be able to demo, understand posture and body mechanics to prevent reinjury.    Time 6   Period Weeks   Status On-going   PT LONG TERM GOAL #2   Title Pt will be able to reach overhead with min occasional pain   Time 6   Period Weeks  Status On-going   PT LONG TERM GOAL #3   Title Pt will  be able to lift, carry grocery bags without increase in pain.    Time 6   Period Weeks   Status On-going   PT LONG TERM GOAL #4   Title Pt will be able to demo functional improvement to 32% or less as evidenced by FOTO   Time 6   Period Weeks   Status On-going               Plan - 05/15/15 1583    Clinical Impression Statement Pt progressed to red band shoulder/scapular strengthening with minimal increase in pain. She continues to c/o throbbing deep in right shoulder.    PT Next Visit Plan continue, manual, painfree ROM and strength        Problem List Patient Active Problem List   Diagnosis Date Noted  . Orthostatic hypotension 03/20/2015  . Right shoulder pain 03/20/2015  . Low back pain 03/20/2015  . Dry cough 03/20/2015  .  Rash of hands 03/05/2014  . Concern about STD in female without diagnosis 03/05/2014  . Positive blood culture 03/05/2014  . Sepsis 03/04/2014  . Chest pain 03/04/2014  . Essential hypertension, benign 03/04/2014  . CAP (community acquired pneumonia) 03/03/2014    Dorene Ar, PTA 05/15/2015, 8:46 AM  [facility] [address] [place], [place], [address] Phone: [phone]   Fax:  [phone]

## 2015-05-15 NOTE — Telephone Encounter (Signed)
Will place order for MRI brain to evaluate for other lesions given the patient has had intermittent symptoms over the last several months.

## 2015-05-15 NOTE — Telephone Encounter (Signed)
Darlene Mcconnell is calling because she was told by Dr. Caryl Bis that she needed to get an MRI done at [facility]; at [facility]. She stated that she was expecting a phone call with an appointment time. She is calling because she would like to know when to expect this appointment. Thank you, Fonda Kinder, ASA

## 2015-05-20 ENCOUNTER — Ambulatory Visit: Payer: Medicaid Other | Admitting: Physical Therapy

## 2015-05-20 DIAGNOSIS — M25511 Pain in right shoulder: Secondary | ICD-10-CM

## 2015-05-20 DIAGNOSIS — R209 Unspecified disturbances of skin sensation: Secondary | ICD-10-CM

## 2015-05-20 DIAGNOSIS — R29898 Other symptoms and signs involving the musculoskeletal system: Secondary | ICD-10-CM

## 2015-05-20 NOTE — Therapy (Signed)
[place], [place], [address] Phone: [phone]   Fax:  [phone]  Physical Therapy Treatment  Patient Details  Name: Darlene Mcconnell MRN: [id number] Date of Birth: December 31, 1955 Referring Provider:  Leone Brand, MD  Encounter Date: 05/20/2015      PT End of Session - 05/20/15 0907    Visit Number 6   Number of Visits 12   Date for PT Re-Evaluation 06/03/15   PT Start Time 0900  pt 15 min late   PT Stop Time 0934   PT Time Calculation (min) 34 min   Activity Tolerance Patient tolerated treatment well      Past Medical History  Diagnosis Date  . Hypertension   . Migraine   . Intracranial aneurysm 2013    left    Past Surgical History  Procedure Laterality Date  . No past surgeries    . Craniotomy  08/17/2012    Procedure: CRANIOTOMY INTRACRANIAL ANEURYSM FOR CAROTID;  Surgeon: Winfield Cunas, MD;  Location: [place] [facility];  Service: Neurosurgery;  Laterality: Left;  Craniotomy for Aneurysm Clipping    There were no vitals filed for this visit.  Visit Diagnosis:  Shoulder pain, right  Weakness of shoulder  Sensory disturbance      Subjective Assessment - 05/20/15 0901    Subjective Min pain in Rt. lateral shoulder (feels like its out of joint) and reports less sensory sx in hands, min on Rt. side.  MRI to be done in June   Currently in Pain? Yes   Pain Score 3    Pain Location Shoulder   Pain Orientation Right;Lateral   Pain Descriptors / Indicators Other (Comment)  hollow, unstable   Pain Type Chronic pain   Pain Onset More than a month ago   Pain Frequency Intermittent            [facility] PT Assessment - 05/20/15 0923    AROM   Right Shoulder Flexion 162 Degrees  no pain   Right Shoulder ABduction 160 Degrees   Right Shoulder Internal Rotation 85 Degrees  min pain   Right Shoulder External Rotation 90 Degrees   Left Shoulder Flexion 160 Degrees   Left Shoulder  ABduction 160 Degrees   Left Shoulder Internal Rotation 90 Degrees   Left Shoulder External Rotation 90 Degrees   Strength   Right Shoulder Flexion 4+/5   Right Shoulder ABduction 4+/5   Right Shoulder Internal Rotation 4+/5  min pain   Right Shoulder External Rotation 3+/5  min pain    Left Shoulder Flexion 4+/5   Left Shoulder ABduction 5/5            [facility] Adult PT Treatment/Exercise - 05/20/15 0910    Shoulder Exercises: Supine   Horizontal ABduction Strengthening;Both;15 reps;Theraband;Other (comment)   Theraband Level (Shoulder Horizontal ABduction) Level 2 (Red)   Horizontal ABduction Weight (lbs) on foam roller   External Rotation Strengthening;Both;15 reps;Theraband   Theraband Level (Shoulder External Rotation) Level 2 (Red)   External Rotation Weight (lbs) on foam   Internal Rotation Strengthening;Both;15 reps;Theraband   Theraband Level (Shoulder Internal Rotation) Level 2 (Red)   Other Supine Exercises cane overhead reach on foam x 10    Other Supine Exercises diagonal pull x 10 each on foam   Shoulder Exercises: Standing   Other Standing Exercises standing against wall for posture training with foam   Other Standing Exercises plank on elbows on foam roll for scap stab.  PT Education - 05/20/15 0934    Education provided Yes   Education Details standing posture and core engagement   Person(s) Educated Patient   Methods Explanation;Demonstration   Comprehension Verbalized understanding;Need further instruction          PT Short Term Goals - 04/22/15 1414    PT SHORT TERM GOAL #1   Title STG=LTG           PT Long Term Goals - 05/20/15 0937    PT LONG TERM GOAL #1   Title Pt wil be able to demo, understand posture and body mechanics to prevent reinjury.    Status Partially Met   PT LONG TERM GOAL #2   Title Pt will be able to reach overhead with min occasional pain   Baseline no pain    Status Achieved   PT LONG TERM  GOAL #3   Title Pt will  be able to lift, carry grocery bags without increase in pain.    Status On-going   PT LONG TERM GOAL #4   Title Pt will be able to demo functional improvement to 32% or less as evidenced by FOTO   Status Unable to assess               Plan - 05/20/15 0935    Clinical Impression Statement Patient has made significant improvement in ROM, strength and has less Rt. hand sx.  She will have MRI of brain to R/U demyelination ds (found a suspect area on C spine MRI).  She will be out of town but may cont. to come to [facility] to ensure progress.     PT Next Visit Plan continue, manual, painfree ROM and strength. DO FOTO to assess progress, consider renewal?   PT Home Exercise Plan RC strength and standing band ex.  (row, shoulder ext)   Consulted and Agree with Plan of Care Patient        Problem List Patient Active Problem List   Diagnosis Date Noted  . Orthostatic hypotension 03/20/2015  . Right shoulder pain 03/20/2015  . Low back pain 03/20/2015  . Dry cough 03/20/2015  . Rash of hands 03/05/2014  . Concern about STD in female without diagnosis 03/05/2014  . Positive blood culture 03/05/2014  . Sepsis 03/04/2014  . Chest pain 03/04/2014  . Essential hypertension, benign 03/04/2014  . CAP (community acquired pneumonia) 03/03/2014    PAA,JENNIFER 05/20/2015, 9:43 AM  [facility] [address] [place], [place], [address] Phone: [phone]   Fax:  [phone]    Raeford Razor, PT 05/20/2015 9:44 AM Phone: [phone] Fax: [phone]

## 2015-05-22 ENCOUNTER — Ambulatory Visit: Payer: Medicaid Other | Admitting: Physical Therapy

## 2015-05-30 ENCOUNTER — Ambulatory Visit (HOSPITAL_COMMUNITY): Payer: Self-pay

## 2015-06-09 ENCOUNTER — Other Ambulatory Visit: Payer: Self-pay | Admitting: Family Medicine

## 2015-06-09 ENCOUNTER — Ambulatory Visit (HOSPITAL_COMMUNITY)
Admission: RE | Admit: 2015-06-09 | Discharge: 2015-06-09 | Disposition: A | Discharge status: Home / Self Care | Payer: Medicaid Other | Source: Ambulatory Visit | Attending: Family Medicine | Admitting: Family Medicine

## 2015-06-09 DIAGNOSIS — G379 Demyelinating disease of central nervous system, unspecified: Secondary | ICD-10-CM | POA: Diagnosis not present

## 2015-06-09 DIAGNOSIS — R202 Paresthesia of skin: Secondary | ICD-10-CM

## 2015-06-09 DIAGNOSIS — G939 Disorder of brain, unspecified: Secondary | ICD-10-CM | POA: Diagnosis not present

## 2015-06-09 LAB — CREATININE, SERUM: CREATININE: 0.77 mg/dL (ref 0.44–1.00)

## 2015-06-09 MED ORDER — GADOBENATE DIMEGLUMINE 529 MG/ML IV SOLN
10.0000 mL | Freq: Once | INTRAVENOUS | Status: AC | PRN
  Administered 2015-06-09: 10 mL via INTRAVENOUS

## 2015-06-10 ENCOUNTER — Telehealth: Payer: Self-pay | Admitting: Family Medicine

## 2015-06-10 ENCOUNTER — Ambulatory Visit: Payer: No Typology Code available for payment source | Admitting: Physical Therapy

## 2015-06-10 DIAGNOSIS — R930 Abnormal findings on diagnostic imaging of skull and head, not elsewhere classified: Secondary | ICD-10-CM

## 2015-06-10 NOTE — Telephone Encounter (Signed)
Called patient to inform of MRI results. Advised that the MRI findings were consistent with MS and that we would be referring her to neurology for further evaluation. She notes no symptoms at this time, though notes that she has had intermittent tingling in the past. Discussed that it is difficult to predict how this will progress in any individual. Will refer to neurology. Discussed return precautions.

## 2015-06-12 ENCOUNTER — Ambulatory Visit: Payer: No Typology Code available for payment source | Attending: Family Medicine | Admitting: Physical Therapy

## 2015-06-25 ENCOUNTER — Ambulatory Visit (INDEPENDENT_AMBULATORY_CARE_PROVIDER_SITE_OTHER): Payer: Medicaid Other | Admitting: Neurology

## 2015-06-25 ENCOUNTER — Encounter: Payer: Self-pay | Admitting: Neurology

## 2015-06-25 VITALS — BP 155/82 | HR 61 | Temp 97.6°F | Ht 63.0 in | Wt 132.0 lb

## 2015-06-25 DIAGNOSIS — Z9889 Other specified postprocedural states: Secondary | ICD-10-CM

## 2015-06-25 DIAGNOSIS — Z79899 Other long term (current) drug therapy: Secondary | ICD-10-CM

## 2015-06-25 DIAGNOSIS — R269 Unspecified abnormalities of gait and mobility: Secondary | ICD-10-CM

## 2015-06-25 DIAGNOSIS — M25511 Pain in right shoulder: Secondary | ICD-10-CM

## 2015-06-25 DIAGNOSIS — R202 Paresthesia of skin: Secondary | ICD-10-CM | POA: Diagnosis not present

## 2015-06-25 DIAGNOSIS — G35 Multiple sclerosis: Secondary | ICD-10-CM

## 2015-06-25 DIAGNOSIS — Z8679 Personal history of other diseases of the circulatory system: Secondary | ICD-10-CM | POA: Insufficient documentation

## 2015-06-25 DIAGNOSIS — R200 Anesthesia of skin: Secondary | ICD-10-CM | POA: Insufficient documentation

## 2015-06-25 HISTORY — DX: Unspecified abnormalities of gait and mobility: R26.9

## 2015-06-25 NOTE — Progress Notes (Signed)
[facility]  PATIENT: Darlene Mcconnell DOB: 01-01-1956  REFERRING DOCTOR OR PCP:  Tommi Rumps SOURCE: patient, records and MRI images on PACS  _________________________________   HISTORICAL  CHIEF COMPLAINT:  Chief Complaint  Patient presents with  . New Evaluation    Abnormal MRI brain     HISTORY OF PRESENT ILLNESS:  I had the pleasure seeing you patient, Darlene Mcconnell, at [facility] for neurologic consultation regarding her paresthesias and abnormal MRIs.  As you know, she is a 59 year old woman who began to experience in the left arm about 6 months ago. She noted numbness in the hand and higher up. A couple months later she began to have some pain in the shoulder and the neck area. On 05/07/2015, she had an MRI of the cervical spine that I personally reviewed. It shows a small focus of hyperintense signal adjacent to C2-C3 most evident on inversion recovery images but more subtly present on axial images as well. It was suggestive of MS and an MRI of the brain was performed on 06/09/2015. I personally reviewed those images. That shows multiple white matter foci in a pattern consistent with the diagnosis of MS. Specifically, foci of present in the periventricular and juxtacortical white matter.  There is one enhancing focus in the right occipital lobe.     The presence of an enhancing lesion along with chronic periventricular and juxtacortical lesions and the presence of the spinal cord plaque allow [place] to make the diagnosis of multiple sclerosis with relative certainty.  Gait/strength/sensation:  She notes that her gait is sometimes off balance, especially if it is dark. She denies any significant weakness. She continues to have some intermittent numbness in the right hand but she no longer has the constant numbness that was present 6 months ago.  Bladder/bowel:   She notes some urinary frequency, especially if she drinks little bit  more. She currently has nocturia 2-3 times nightly but used to have 0 or 1 times. She has not had any urinary incontinence.   She notes some hesitancy but this was present for a long time. Bowel function is fine.  Vision: She denies any blurriness with vision when she wears her glasses. She denies any diplopia.  Fatigue/sleep:  She notes some fatigue and is sometimes excessively sleepy. She feels more tired if she has to use her right arm a lot. She notes some trouble falling asleep, mostly due to pain in the right arm. Once asleep, she wakes up a few times but usually is able to fall back asleep.  Mood/cognition:  She denies any depression or anxiety.  She feels her cognition is generally doing well and she feels she is thinking about the same as she did a few years ago. She tries to read daily.  Right shoulder and neck pain: For the 5-6 months, she has had pain in the right shoulder and the adjacent neck. Pain is worse when she raises the arm or holds it backwards.  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  This was felt to be symptomatic as she had eyelid drooping and she also had severe headaches though there was no definite evidence of rupture.  Surgery was performed by Dr. Christella Noa.  She has two cousins with MS.   One is bedridden and the other is doing well.     REVIEW OF SYSTEMS: Constitutional: No fevers, chills, sweats, or change in appetite.  She has  fatigue and insomnia. Eyes: No visual changes, double vision, eye pain Ear, nose and throat: No hearing loss, ear pain, nasal congestion, sore throat Cardiovascular: No chest pain, palpitations Respiratory: No shortness of breath at rest or with exertion.   No wheezes GastrointestinaI: No nausea, vomiting, diarrhea, abdominal pain, fecal incontinence Genitourinary: No dysuria, urinary retention or frequency.  No nocturia. Musculoskeletal: No neck pain, back pain Integumentary: No rash,  pruritus, skin lesions Neurological: as above Psychiatric: No depression at this time.  No anxiety Endocrine: No palpitations, diaphoresis, change in appetite, change in weigh or increased thirst Hematologic/Lymphatic: No anemia, purpura, petechiae  She feels she bruises easily.. Allergic/Immunologic: No itchy/runny eyes, nasal congestion, recent allergic reactions, rashes  ALLERGIES: No Known Allergies  HOME MEDICATIONS:  Current outpatient prescriptions:  .  lisinopril (PRINIVIL,ZESTRIL) 10 MG tablet, Take 1 tablet (10 mg total) by mouth daily., Disp: 90 tablet, Rfl: 3 .  naproxen (NAPROSYN) 500 MG tablet, TAKE 1 TABLET BY MOUTH TWICE A DAY WITH A MEAL FOR 5 DAYS THEN TWICE A DAY AS NEEDED, Disp: 30 tablet, Rfl: 0  PAST MEDICAL HISTORY: Past Medical History  Diagnosis Date  . Hypertension   . Migraine   . Intracranial aneurysm 2013    left    PAST SURGICAL HISTORY: Past Surgical History  Procedure Laterality Date  . Craniotomy  08/17/2012    Procedure: CRANIOTOMY INTRACRANIAL ANEURYSM FOR CAROTID;  Surgeon: Winfield Cunas, MD;  Location: [facility];  Service: Neurosurgery;  Laterality: Left;  Craniotomy for Aneurysm Clipping    FAMILY HISTORY: Family History  Problem Relation Age of Onset  . Dementia      SOCIAL HISTORY:  History   Social History  . Marital Status: Legally Separated    Spouse Name: N/A  . Number of Children: N/A  . Years of Education: N/A   Occupational History  . Not on file.   Social History Main Topics  . Smoking status: Current Every Day Smoker -- 0.25 packs/day for 12 years    Types: Cigarettes  . Smokeless tobacco: Not on file  . Alcohol Use: Yes     Comment: Beer (3-4) every night when she gets off work.   . Drug Use: Yes    Special: Cocaine, Marijuana  . Sexual Activity: Not on file   Other Topics Concern  . Not on file   Social History Narrative     PHYSICAL EXAM  Filed Vitals:   06/25/15 1028  BP: 155/82  Pulse:  61  Temp: 97.6 F (36.4 C)  TempSrc: Oral  Height: 5\' 3"  (1.6 m)  Weight: 132 lb (59.875 kg)    Body mass index is 23.39 kg/(m^2).   General: The patient is well-developed and well-nourished and in no acute distress  Eyes:  Funduscopic exam shows normal optic discs and retinal vessels.  Neck: The neck is supple, no carotid bruits are noted.  The neck is nontender.  Cardiovascular: The heart has a regular rate and rhythm with a normal S1 and S2. There were no murmurs, gallops or rubs. Lungs are clear to auscultation.  Skin: Extremities are without significant edema.  Musculoskeletal:  Back is non-tender.   She is very tender over the subacromial bursa on the right and mildly tender over the glenohumeral joint of the shoulder.  Neurologic Exam  Mental status: The patient is alert and oriented x 3 at the time of the examination. The patient has apparent normal recent and remote memory, with an apparently normal attention  span and concentration ability.   Speech is normal.  Cranial nerves: Extraocular movements are full. Pupils are equal, round, and reactive to light and accomodation.  Visual fields are full.  Facial symmetry is present. There is good facial sensation to soft touch bilaterally.Facial strength is normal.  Trapezius and sternocleidomastoid strength is normal. No dysarthria is noted.  The tongue is midline, and the patient has symmetric elevation of the soft palate. No obvious hearing deficits are noted.  Motor:  Muscle bulk is normal.   Tone is normal. Strength is  5 / 5 in all 4 extremities.   Sensory: Sensory testing is intact to pinprick, soft touch and vibration sensation in all 4 extremities.  Coordination: Cerebellar testing reveals good finger-nose-finger and heel-to-shin bilaterally.  Gait and station: Station is normal.   Gait is normal. Tandem gait is mildly wide. Romberg is negative.   Reflexes: Deep tendon reflexes are symmetric but increased at the knees  with spread to the other side.   Plantar responses are flexor.    DIAGNOSTIC DATA (LABS, IMAGING, TESTING) - I reviewed patient records, labs, notes, testing and imaging myself where available.  Lab Results  Component Value Date   WBC 19.1* 03/04/2014   HGB 11.7* 03/04/2014   HCT 34.0* 03/04/2014   MCV 99.7 03/04/2014   PLT 237 03/04/2014      Component Value Date/Time   NA 140 03/20/2015 0943   K 4.4 03/20/2015 0943   CL 101 03/20/2015 0943   CO2 27 03/20/2015 0943   GLUCOSE 71 03/20/2015 0943   BUN 13 03/20/2015 0943   CREATININE 0.77 06/09/2015 1530   CREATININE 0.79 03/20/2015 0943   CALCIUM 10.5 03/20/2015 0943   PROT 7.1 08/17/2012 0405   ALBUMIN 3.7 08/17/2012 0405   AST 22 08/17/2012 0405   ALT 18 08/17/2012 0405   ALKPHOS 59 08/17/2012 0405   BILITOT 0.3 08/17/2012 0405   GFRNONAA >60 06/09/2015 1530   GFRAA >60 06/09/2015 1530        ASSESSMENT AND PLAN  Multiple sclerosis - Plan: CBC with Differential/Platelet, Hepatic function panel, Stratify JCV Antibody Test ([facility]), Quantiferon tb gold assay  Numbness and tingling in right hand  Gait disturbance  High risk medication use - Plan: CBC with Differential/Platelet, Hepatic function panel, Stratify JCV Antibody Test ([facility]), Quantiferon tb gold assay  Right shoulder pain  History of cerebral aneurysm repair   In summary, Makiyah Zentz is a 59 year old woman who had an episode of right hand numbness that has since improved (though not to baseline) an MRI of the brain and spinal cord very consistent with the diagnosis of multiple sclerosis. She meets McDonald criteria based on her episode and the enhancing lesion and the location / number of her lesions.   Therefore, I do not think we need to obtain spinal fluid for further analysis.  She needs to start a disease modifying therapy.  We discussed the FDA approved medications for MS. She is most interested one of the oral agents as she well she would  do with shots. Going over the pros and cons of Aubagio, Tecfidera and Gilenya, she opted to start Aubagio. I will make sure that initial labs are fine and we will check a CBC, LFT and quantiferon gold TB test.  She understands that the main risk with a box-year-old is liver issues and she would need to get monthly LFTs for 6 months. She signed the service request form and we will send it in once labs  return she also has right shoulder pain and her exam is consistent with subacromial bursitis. I did an injection they're using sterile technique with 3 mL of Marcaine containing 2 mg of Decadron. She tolerated the procedure well and was much better within minutes.  She will return to see me in 2 months or sooner if she has new or worsening neurologic symptoms.   Richard A. Felecia Shelling, MD, PhD 01/06/5519, 80:22 AM Certified in Neurology, Clinical Neurophysiology, Sleep Medicine, Pain Medicine and Neuroimaging  [facility] [address] [place], [place] [address] [phone]

## 2015-06-26 LAB — CBC WITH DIFFERENTIAL/PLATELET
BASOS ABS: 0 10*3/uL (ref 0.0–0.2)
Basos: 0 %
EOS (ABSOLUTE): 0.2 10*3/uL (ref 0.0–0.4)
EOS: 1 %
Hematocrit: 37.9 % (ref 34.0–46.6)
Hemoglobin: 12.8 g/dL (ref 11.1–15.9)
Immature Grans (Abs): 0 10*3/uL (ref 0.0–0.1)
Immature Granulocytes: 0 %
Lymphocytes Absolute: 3.5 10*3/uL — ABNORMAL HIGH (ref 0.7–3.1)
Lymphs: 24 %
MCH: 34.5 pg — ABNORMAL HIGH (ref 26.6–33.0)
MCHC: 33.8 g/dL (ref 31.5–35.7)
MCV: 102 fL — AB (ref 79–97)
Monocytes Absolute: 1 10*3/uL — ABNORMAL HIGH (ref 0.1–0.9)
Monocytes: 7 %
Neutrophils Absolute: 9.6 10*3/uL — ABNORMAL HIGH (ref 1.4–7.0)
Neutrophils: 68 %
Platelets: 312 10*3/uL (ref 150–379)
RBC: 3.71 x10E6/uL — ABNORMAL LOW (ref 3.77–5.28)
RDW: 14.9 % (ref 12.3–15.4)
WBC: 14.3 10*3/uL — AB (ref 3.4–10.8)

## 2015-06-26 LAB — HEPATIC FUNCTION PANEL
ALT: 12 IU/L (ref 0–32)
AST: 16 IU/L (ref 0–40)
Albumin: 4.6 g/dL (ref 3.5–5.5)
Alkaline Phosphatase: 87 IU/L (ref 39–117)
BILIRUBIN, DIRECT: 0.17 mg/dL (ref 0.00–0.40)
Bilirubin Total: 0.6 mg/dL (ref 0.0–1.2)
Total Protein: 7.4 g/dL (ref 6.0–8.5)

## 2015-06-27 LAB — QUANTIFERON IN TUBE
QFT TB AG MINUS NIL VALUE: 0.17 IU/mL
QUANTIFERON MITOGEN VALUE: 0.03 IU/mL
QUANTIFERON TB AG VALUE: 0.2 [IU]/mL
QUANTIFERON TB GOLD: UNDETERMINED — AB
Quantiferon Nil Value: 0.03 IU/mL

## 2015-06-27 LAB — QUANTIFERON TB GOLD ASSAY (BLOOD)

## 2015-07-02 ENCOUNTER — Ambulatory Visit: Payer: Self-pay | Admitting: Neurology

## 2015-07-07 ENCOUNTER — Telehealth: Payer: Self-pay | Admitting: Neurology

## 2015-07-07 DIAGNOSIS — G35 Multiple sclerosis: Secondary | ICD-10-CM

## 2015-07-07 DIAGNOSIS — Z79899 Other long term (current) drug therapy: Secondary | ICD-10-CM

## 2015-07-07 NOTE — Telephone Encounter (Signed)
I have spoken with Darlene Mcconnell this afternoon.  Because TB Gold test was borderline, he would like to get a cxr to r/o tb prior to starting Aubagio.  She is agreeable.  CXR pa and lat has been ordered--and I have advised she may have this done at [place] Mon-Fri from 1530-1700 without an appt.  She verbalized understanding of same, is agreeable to having cxr./fim

## 2015-07-07 NOTE — Telephone Encounter (Signed)
Patient is calling and states that she was susposed to start on a new medication after her last appointment but she doesn't know the name and would like to know when she is to start.  Please call.

## 2015-07-12 ENCOUNTER — Emergency Department (HOSPITAL_COMMUNITY)
Admission: EM | Admit: 2015-07-12 | Discharge: 2015-07-12 | Disposition: A | Discharge status: Home / Self Care | Payer: Medicaid Other | Attending: Emergency Medicine | Admitting: Emergency Medicine

## 2015-07-12 ENCOUNTER — Encounter (HOSPITAL_COMMUNITY): Payer: Self-pay | Admitting: Nurse Practitioner

## 2015-07-12 DIAGNOSIS — T148 Other injury of unspecified body region: Secondary | ICD-10-CM | POA: Insufficient documentation

## 2015-07-12 DIAGNOSIS — Y92410 Unspecified street and highway as the place of occurrence of the external cause: Secondary | ICD-10-CM | POA: Diagnosis not present

## 2015-07-12 DIAGNOSIS — G43909 Migraine, unspecified, not intractable, without status migrainosus: Secondary | ICD-10-CM | POA: Insufficient documentation

## 2015-07-12 DIAGNOSIS — I10 Essential (primary) hypertension: Secondary | ICD-10-CM | POA: Insufficient documentation

## 2015-07-12 DIAGNOSIS — Z720 Tobacco use: Secondary | ICD-10-CM | POA: Insufficient documentation

## 2015-07-12 DIAGNOSIS — Y9389 Activity, other specified: Secondary | ICD-10-CM | POA: Insufficient documentation

## 2015-07-12 DIAGNOSIS — Z79899 Other long term (current) drug therapy: Secondary | ICD-10-CM | POA: Diagnosis not present

## 2015-07-12 DIAGNOSIS — M791 Myalgia, unspecified site: Secondary | ICD-10-CM

## 2015-07-12 DIAGNOSIS — Y998 Other external cause status: Secondary | ICD-10-CM | POA: Insufficient documentation

## 2015-07-12 HISTORY — DX: Multiple sclerosis: G35

## 2015-07-12 MED ORDER — IBUPROFEN 800 MG PO TABS: 800.0000 mg | ORAL_TABLET | Freq: Three times a day (TID) | ORAL | Status: DC
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
## 2015-07-12 NOTE — ED Notes (Signed)
Declined W/C at D/C and was escorted to lobby by RN. 

## 2015-07-12 NOTE — Discharge Instructions (Signed)

## 2015-07-16 ENCOUNTER — Ambulatory Visit
Admission: RE | Admit: 2015-07-16 | Discharge: 2015-07-16 | Disposition: A | Discharge status: Home / Self Care | Payer: No Typology Code available for payment source | Source: Ambulatory Visit | Attending: Neurology | Admitting: Neurology

## 2015-07-16 DIAGNOSIS — G35 Multiple sclerosis: Secondary | ICD-10-CM

## 2015-07-16 DIAGNOSIS — Z79899 Other long term (current) drug therapy: Secondary | ICD-10-CM

## 2015-07-18 ENCOUNTER — Ambulatory Visit
Admission: RE | Admit: 2015-07-18 | Discharge: 2015-07-18 | Disposition: A | Discharge status: Home / Self Care | Payer: Self-pay | Source: Ambulatory Visit | Attending: Neurology | Admitting: Neurology

## 2015-07-18 ENCOUNTER — Encounter: Payer: Self-pay | Admitting: *Deleted

## 2015-07-18 ENCOUNTER — Telehealth: Payer: Self-pay | Admitting: *Deleted

## 2015-07-18 NOTE — Telephone Encounter (Signed)
-----   Message from Britt Bottom, MD sent at 07/18/2015  9:21 AM EDT ----- No evidence of TB on xray, we can start Aubagio

## 2015-07-18 NOTE — Telephone Encounter (Signed)
[place].  I have faxed Aubagio form in to [employer]/fim

## 2015-07-21 NOTE — Telephone Encounter (Signed)
I have spoken with Darlene Mcconnell this morning and per RAS, advised that cxr was ok, so I faxed Aubagio form in last Friday.  She verbalized understanding of same./fim

## 2015-07-21 NOTE — Telephone Encounter (Signed)
-----   Message from Britt Bottom, MD sent at 07/18/2015  9:21 AM EDT ----- No evidence of TB on xray, we can start Aubagio

## 2015-07-22 ENCOUNTER — Encounter (HOSPITAL_COMMUNITY): Payer: Self-pay | Admitting: *Deleted

## 2015-07-22 ENCOUNTER — Inpatient Hospital Stay (HOSPITAL_COMMUNITY)
Admission: AD | Admit: 2015-07-22 | Discharge: 2015-07-22 | Disposition: A | Discharge status: Home / Self Care | Payer: Medicaid Other | Source: Ambulatory Visit | Attending: Obstetrics and Gynecology | Admitting: Obstetrics and Gynecology

## 2015-07-22 DIAGNOSIS — I10 Essential (primary) hypertension: Secondary | ICD-10-CM | POA: Insufficient documentation

## 2015-07-22 DIAGNOSIS — R300 Dysuria: Secondary | ICD-10-CM | POA: Diagnosis not present

## 2015-07-22 DIAGNOSIS — N390 Urinary tract infection, site not specified: Secondary | ICD-10-CM | POA: Insufficient documentation

## 2015-07-22 DIAGNOSIS — N952 Postmenopausal atrophic vaginitis: Secondary | ICD-10-CM

## 2015-07-22 DIAGNOSIS — F17210 Nicotine dependence, cigarettes, uncomplicated: Secondary | ICD-10-CM | POA: Insufficient documentation

## 2015-07-22 HISTORY — DX: Amyotrophic lateral sclerosis: G12.21

## 2015-07-22 HISTORY — DX: Myoneural disorder, unspecified: G70.9

## 2015-07-22 LAB — URINALYSIS, ROUTINE W REFLEX MICROSCOPIC
Bilirubin Urine: NEGATIVE
Glucose, UA: NEGATIVE mg/dL
Ketones, ur: NEGATIVE mg/dL
LEUKOCYTES UA: NEGATIVE
NITRITE: NEGATIVE
PH: 5.5 (ref 5.0–8.0)
Protein, ur: NEGATIVE mg/dL
Specific Gravity, Urine: 1.025 (ref 1.005–1.030)
UROBILINOGEN UA: 0.2 mg/dL (ref 0.0–1.0)

## 2015-07-22 LAB — URINE MICROSCOPIC-ADD ON

## 2015-07-22 MED ORDER — CIPROFLOXACIN HCL 500 MG PO TABS: 500.0000 mg | ORAL_TABLET | Freq: Two times a day (BID) | ORAL | Status: DC

## 2015-07-22 NOTE — MAU Note (Signed)
fels like the urinary tract infection.  Burns so bad when she goes. Frequency and urgency.

## 2015-07-22 NOTE — Discharge Instructions (Signed)

## 2015-07-22 NOTE — Progress Notes (Signed)
Written and verbal d/c instructions given and understanding voiced. 

## 2015-07-22 NOTE — MAU Note (Signed)
Pt needs to leave to pickup someone. DConard Novak CNM will call pt with results.

## 2015-07-22 NOTE — MAU Provider Note (Signed)
History     CSN: [id number]  Arrival date and time: 07/22/15 1351   First Provider Initiated Contact with Patient 07/22/15 1557      Chief Complaint  Patient presents with  . Dysuria   HPI Comments: Darlene Mcconnell is a 59 y.o. [phone] with 2 day history of burning on urination. She also reports urgency and frequency of urination. Denies hematuria. Denies irritative vaginal discharge. Menopause 5 years ago. Single sexual partner. Denies dyspaerunia. No Pap for several years. Recently diagnosed with MS. Planning to get a PCP. Forgot to take antihypertensive today.    Dysuria  This is a new problem. The current episode started yesterday. The problem occurs every urination. The problem has been gradually worsening. The quality of the pain is described as burning and aching. The pain is at a severity of 8/10. The pain is severe. There has been no fever. She is sexually active. There is no history of pyelonephritis. Associated symptoms include a possible pregnancy and urgency. Pertinent negatives include no chills, discharge, flank pain, hematuria, hesitancy, nausea or vomiting. She has tried nothing for the symptoms.     Past Medical History  Diagnosis Date  . Hypertension   . Migraine   . Intracranial aneurysm 2013    left  . Multiple sclerosis   . Neuromuscular disorder   . Amyotrophic lateral sclerosis/progressive muscular atrophy     Past Surgical History  Procedure Laterality Date  . Craniotomy  08/17/2012    Procedure: CRANIOTOMY INTRACRANIAL ANEURYSM FOR CAROTID;  Surgeon: Winfield Cunas, MD;  Location: [facility];  Service: Neurosurgery;  Laterality: Left;  Craniotomy for Aneurysm Clipping    Family History  Problem Relation Age of Onset  . Dementia      History  Substance Use Topics  . Smoking status: Current Every Day Smoker -- 0.25 packs/day for 12 years    Types: Cigarettes  . Smokeless tobacco: Not on file  . Alcohol Use: Yes     Comment: Beer (3-4)  every night when she gets off work.     Allergies: No Known Allergies  Prescriptions prior to admission  Medication Sig Dispense Refill Last Dose  . lisinopril (PRINIVIL,ZESTRIL) 10 MG tablet Take 1 tablet (10 mg total) by mouth daily. 90 tablet 3 07/21/2015 at Unknown time  . ibuprofen (ADVIL,MOTRIN) 800 MG tablet Take 1 tablet (800 mg total) by mouth 3 (three) times daily. 21 tablet 0 PRN  . naproxen (NAPROSYN) 500 MG tablet TAKE 1 TABLET BY MOUTH TWICE A DAY WITH A MEAL FOR 5 DAYS THEN TWICE A DAY AS NEEDED (Patient taking differently: TAKE 1 TABLET BY MOUTH TWICE A DAY WITH A MEAL FOR 5 DAYS THEN TWICE A DAY AS NEEDED PAIN) 30 tablet 0 PRN    Review of Systems  Constitutional: Negative for chills.  Gastrointestinal: Negative for nausea and vomiting.  Genitourinary: Positive for dysuria and urgency. Negative for hesitancy, hematuria and flank pain.   Physical Exam   Blood pressure 164/77, pulse 66, temperature 98.3 F (36.8 C), temperature source Oral, resp. rate 20, height 5\' 3"  (1.6 m), weight 60.328 kg (133 lb).  Physical Exam  Constitutional: She is oriented to person, place, and time. She appears well-developed and well-nourished.  HENT:  Head: Normocephalic.  Neck: Neck supple.  Cardiovascular: Normal rate.   Respiratory: Effort normal.  GI: Soft. She exhibits no distension. There is no tenderness.  Genitourinary:  Fourchette and vagina with atrophic changes. No discharge noted. Cervix appears nulliparous,  clean. Bimanual uterus nontender, NSSP, no adnexal tenderness or masses  Musculoskeletal: Normal range of motion.  Neurological: She is alert and oriented to person, place, and time.  Psychiatric: She has a normal mood and affect. Her behavior is normal. Thought content normal.    MAU Course  Procedures Results for orders placed or performed during the hospital encounter of 07/22/15 (from the past 24 hour(s))  Urinalysis, Routine w reflex microscopic (not at [facility])      Status: Abnormal   Collection Time: 07/22/15  2:20 PM  Result Value Ref Range   Color, Urine YELLOW YELLOW   APPearance CLEAR CLEAR   Specific Gravity, Urine 1.025 1.005 - 1.030   pH 5.5 5.0 - 8.0   Glucose, UA NEGATIVE NEGATIVE mg/dL   Hgb urine dipstick SMALL (A) NEGATIVE   Bilirubin Urine NEGATIVE NEGATIVE   Ketones, ur NEGATIVE NEGATIVE mg/dL   Protein, ur NEGATIVE NEGATIVE mg/dL   Urobilinogen, UA 0.2 0.0 - 1.0 mg/dL   Nitrite NEGATIVE NEGATIVE   Leukocytes, UA NEGATIVE NEGATIVE  Urine microscopic-add on     Status: Abnormal   Collection Time: 07/22/15  2:20 PM  Result Value Ref Range   Squamous Epithelial / LPF FEW (A) RARE   WBC, UA 0-2 <3 WBC/hpf   RBC / HPF 0-2 <3 RBC/hpf   Culture sent GC/CT sent   Assessment and Plan  Presumptive UTI 1. Dysuria   2. Postmenopausal atrophic vaginitis   3. Essential hypertension      Medication List    STOP taking these medications        ibuprofen 800 MG tablet  Commonly known as:  ADVIL,MOTRIN      TAKE these medications        ciprofloxacin 500 MG tablet  Commonly known as:  CIPRO  Take 1 tablet (500 mg total) by mouth 2 (two) times daily.     lisinopril 10 MG tablet  Commonly known as:  PRINIVIL,ZESTRIL  Take 1 tablet (10 mg total) by mouth daily.     naproxen 500 MG tablet  Commonly known as:  NAPROSYN  TAKE 1 TABLET BY MOUTH TWICE A DAY WITH A MEAL FOR 5 DAYS THEN TWICE A DAY AS NEEDED       Follow-up Information    Schedule an appointment as soon as possible for a visit with Tommi Rumps, MD.   Specialty:  Family Medicine   Why:  Call [phone] to get dates for free Pap Clinic   Contact information:   [address] [place]  [place] [address] [phone]      Darlene Mcconnell 07/22/2015, 3:59 PM

## 2015-07-23 LAB — GC/CHLAMYDIA PROBE AMP (~~LOC~~) NOT AT ARMC
Chlamydia: NEGATIVE
NEISSERIA GONORRHEA: NEGATIVE

## 2015-07-24 LAB — URINE CULTURE: SPECIAL REQUESTS: NORMAL

## 2015-08-01 ENCOUNTER — Encounter (HOSPITAL_COMMUNITY): Payer: Self-pay | Admitting: Emergency Medicine

## 2015-08-01 ENCOUNTER — Emergency Department (HOSPITAL_COMMUNITY)
Admission: EM | Admit: 2015-08-01 | Discharge: 2015-08-01 | Disposition: A | Discharge status: Home / Self Care | Payer: Medicaid Other | Attending: Emergency Medicine | Admitting: Emergency Medicine

## 2015-08-01 DIAGNOSIS — K029 Dental caries, unspecified: Secondary | ICD-10-CM | POA: Insufficient documentation

## 2015-08-01 DIAGNOSIS — R51 Headache: Secondary | ICD-10-CM | POA: Insufficient documentation

## 2015-08-01 DIAGNOSIS — Z720 Tobacco use: Secondary | ICD-10-CM | POA: Diagnosis not present

## 2015-08-01 DIAGNOSIS — I10 Essential (primary) hypertension: Secondary | ICD-10-CM | POA: Diagnosis not present

## 2015-08-01 DIAGNOSIS — Z8669 Personal history of other diseases of the nervous system and sense organs: Secondary | ICD-10-CM | POA: Diagnosis not present

## 2015-08-01 DIAGNOSIS — Z792 Long term (current) use of antibiotics: Secondary | ICD-10-CM | POA: Insufficient documentation

## 2015-08-01 DIAGNOSIS — Z79899 Other long term (current) drug therapy: Secondary | ICD-10-CM | POA: Diagnosis not present

## 2015-08-01 DIAGNOSIS — K088 Other specified disorders of teeth and supporting structures: Secondary | ICD-10-CM | POA: Diagnosis present

## 2015-08-01 MED ORDER — IBUPROFEN 600 MG PO TABS: 600.0000 mg | ORAL_TABLET | Freq: Four times a day (QID) | ORAL | Status: DC | PRN

## 2015-08-01 MED ORDER — PENICILLIN V POTASSIUM 500 MG PO TABS: 500.0000 mg | ORAL_TABLET | Freq: Four times a day (QID) | ORAL | Status: AC

## 2015-08-01 MED ORDER — KETOROLAC TROMETHAMINE 60 MG/2ML IM SOLN
60.0000 mg | Freq: Once | INTRAMUSCULAR | Status: AC
  Administered 2015-08-01: 60 mg via INTRAMUSCULAR
  Filled 2015-08-01: qty 2

## 2015-08-01 MED ORDER — HYDROCODONE-ACETAMINOPHEN 5-325 MG PO TABS: 1.0000 | ORAL_TABLET | Freq: Four times a day (QID) | ORAL | Status: DC | PRN

## 2015-08-01 NOTE — Discharge Instructions (Signed)
Take penicillin as prescribed until all gone for the infection. Ibuprofen for pain. Norco for severe pain only. Please follow-up with an oral surgeon as instructed.   Dental Pain A tooth ache may be caused by cavities (tooth decay). Cavities expose the nerve of the tooth to air and hot or cold temperatures. It may come from an infection or abscess (also called a boil or furuncle) around your tooth. It is also often caused by dental caries (tooth decay). This causes the pain you are having. DIAGNOSIS  Your caregiver can diagnose this problem by exam. TREATMENT   If caused by an infection, it may be treated with medications which kill germs (antibiotics) and pain medications as prescribed by your caregiver. Take medications as directed.  Only take over-the-counter or prescription medicines for pain, discomfort, or fever as directed by your caregiver.  Whether the tooth ache today is caused by infection or dental disease, you should see your dentist as soon as possible for further care. SEEK MEDICAL CARE IF: The exam and treatment you received today has been provided on an emergency basis only. This is not a substitute for complete medical or dental care. If your problem worsens or new problems (symptoms) appear, and you are unable to meet with your dentist, call or return to this location. SEEK IMMEDIATE MEDICAL CARE IF:   You have a fever.  You develop redness and swelling of your face, jaw, or neck.  You are unable to open your mouth.  You have severe pain uncontrolled by pain medicine. MAKE SURE YOU:   Understand these instructions.  Will watch your condition.  Will get help right away if you are not doing well or get worse. Document Released: 12/13/2005 Document Revised: 03/06/2012 Document Reviewed: 07/31/2008 [facility] Patient Information 2015 [place], [place]. This information is not intended to replace advice given to you by your health care provider. Make sure you discuss any  questions you have with your health care provider.

## 2015-08-01 NOTE — ED Provider Notes (Signed)
CSN: [id number]     Arrival date & time 08/01/15  0807 History   First MD Initiated Contact with Patient 08/01/15 [phone]     Chief Complaint  Patient presents with  . Dental Pain     (Consider location/radiation/quality/duration/timing/severity/associated sxs/prior Treatment) HPI KATALEA UCCI is a 59 y.o. female with history of hypertension, brain aneurysm, MS, ALS, presents to emergency department complaining of dental pain. Patient states she has had dental pain for several weeks but it has worsened in the last 2 days. States she is unable to eat or sleep due to pain. She has been taking ibuprofen and Tylenol with no relief of her symptoms. States she is now having some facial swelling. She thinks she is having an infection. She tried to call multiple dentist yesterday but states "none of them will see me." She denies any fever or chills. She denies any swelling under the tongue, no difficulty breathing. She has no other complaints.  Past Medical History  Diagnosis Date  . Hypertension   . Migraine   . Intracranial aneurysm 2013    left  . Multiple sclerosis   . Neuromuscular disorder   . Amyotrophic lateral sclerosis/progressive muscular atrophy    Past Surgical History  Procedure Laterality Date  . Craniotomy  08/17/2012    Procedure: CRANIOTOMY INTRACRANIAL ANEURYSM FOR CAROTID;  Surgeon: Winfield Cunas, MD;  Location: [facility];  Service: Neurosurgery;  Laterality: Left;  Craniotomy for Aneurysm Clipping   Family History  Problem Relation Age of Onset  . Dementia     History  Substance Use Topics  . Smoking status: Current Every Day Smoker -- 0.50 packs/day for 12 years    Types: Cigarettes  . Smokeless tobacco: Not on file  . Alcohol Use: Yes     Comment: Beer (3-4) every night when she gets off work.    OB History    Gravida Para Term Preterm AB TAB SAB Ectopic Multiple Living   3 2 2  1 1    2      Review of Systems  Constitutional: Negative for fever and  chills.  HENT: Positive for dental problem and facial swelling.   Respiratory: Negative for cough, chest tightness and shortness of breath.   Cardiovascular: Negative for chest pain, palpitations and leg swelling.  Musculoskeletal: Negative for myalgias, arthralgias, neck pain and neck stiffness.  Skin: Negative for rash.  Neurological: Positive for headaches. Negative for dizziness and weakness.  All other systems reviewed and are negative.     Allergies  Review of patient's allergies indicates no known allergies.  Home Medications   Prior to Admission medications   Medication Sig Start Date End Date Taking? Authorizing Provider  ciprofloxacin (CIPRO) 500 MG tablet Take 1 tablet (500 mg total) by mouth 2 (two) times daily. 07/22/15   Deirdre C Poe, CNM  lisinopril (PRINIVIL,ZESTRIL) 10 MG tablet Take 1 tablet (10 mg total) by mouth daily. 03/20/15   Leone Haven, MD  naproxen (NAPROSYN) 500 MG tablet TAKE 1 TABLET BY MOUTH TWICE A DAY WITH A MEAL FOR 5 DAYS THEN TWICE A DAY AS NEEDED Patient taking differently: TAKE 1 TABLET BY MOUTH TWICE A DAY WITH A MEAL FOR 5 DAYS THEN TWICE A DAY AS NEEDED PAIN 04/02/15   Leone Brand, MD   BP 151/83 mmHg  Pulse 74  Temp(Src) 98.5 F (36.9 C) (Oral)  Resp 18  SpO2 100% Physical Exam  Constitutional: She appears well-developed and well-nourished. No distress.  HENT:  Multiple dental cavities, right upper first molar is avulsed to the gumline, tenderness to palpation around the gum. Right  lower second premolar is loose. No swelling under the tongue. No facial swelling.  Eyes: Conjunctivae are normal.  Neck: Neck supple.  Cardiovascular: Normal rate, regular rhythm and normal heart sounds.   Pulmonary/Chest: Effort normal and breath sounds normal. No respiratory distress. She has no wheezes. She has no rales.  Neurological: She is alert.  Skin: Skin is warm and dry.  Nursing note and vitals reviewed.   ED Course  Procedures  (including critical care time) Labs Review Labs Reviewed - No data to display  Imaging Review No results found.   EKG Interpretation None      MDM   Final diagnoses:  Dental decay    patient emergency department with dental pain, multiple decayed teeth noted. We'll start on penicillin, ibuprofen and Norco for pain, follow up with a dentist. Will refer to an oral surgeon.  Filed Vitals:   08/01/15 0811  BP: 151/83  Pulse: 74  Temp: 98.5 F (36.9 C)  Resp: [id number] PA-C 08/01/15 [place], DO 08/01/15 [phone]

## 2015-08-01 NOTE — ED Notes (Signed)
Pt states that she has teeth pain upper and lower right side she states she has some teeth that are broken and she tried to get a dentist appointment but was unable to be seen at her dentist office

## 2015-08-19 ENCOUNTER — Telehealth: Payer: Self-pay | Admitting: Neurology

## 2015-08-19 NOTE — Telephone Encounter (Signed)
I have spoken with Darlene Mcconnell this morning.  I faxed her Aubagio start form in on 07-18-15, but she sts. she has not heard anything from [place] [facility].  I called [facility] and spoke with Jenny Reichmann.  He sts. they have tried mult. times to reach pt. to initiate pt. assistance paperwork but have been unable to reach pt.  They need pt. to call them at [phone] and ask to speak with any nurse.  I spoke with [name] again and passed along this phone #.  She sts. she will call them now/fim

## 2015-08-19 NOTE — Telephone Encounter (Signed)
Patient called inquiring the status of [place]. She states the pain is starting again. Please call and advise. She can be reached at [phone]

## 2015-08-19 NOTE — Telephone Encounter (Signed)
Patient called back and said the called [facility] [employer] [facility] [employer] and they told her that they are going to fax forms to [place] so that she can fill them out and fax back to them (they will try to fax today but it may be tomorrow before 12pm).

## 2015-08-21 ENCOUNTER — Encounter: Payer: Self-pay | Admitting: *Deleted

## 2015-08-21 NOTE — Telephone Encounter (Signed)
I spoke with Caria this morning and advised that I received pt. assistance paperwork from [place]  [facility] and have placed it up front [facility] for her to pick up.  She asked if I can fax this back for her once she has completed it, and I have advised I am happy to do so/fim

## 2015-08-21 NOTE — Telephone Encounter (Signed)
I spoke with Darlene Mcconnell this afternoon--she apparently came by at lunch and completed paperwork--but when it was returned to me, I discovered she signed it, but didn't complete any of financial questions.  She sts. she will come by in the morning and complete paperwork.  I have left it up front for her again/fim

## 2015-08-22 NOTE — Telephone Encounter (Signed)
Darlene Mcconnell came back in today and completed pt. assistance paperwork.  I have faxed this back to [place] [facility] for her/fim

## 2015-09-08 ENCOUNTER — Telehealth: Payer: Self-pay | Admitting: Neurology

## 2015-09-08 ENCOUNTER — Encounter: Payer: Self-pay | Admitting: *Deleted

## 2015-09-08 DIAGNOSIS — Z0271 Encounter for disability determination: Secondary | ICD-10-CM

## 2015-09-08 NOTE — Telephone Encounter (Signed)
Patient called stating she needs letter sent to [facility] stating she is a MS patient and under Dr Garth Bigness care at [facility]. Please call and advise. Patient can be reached at [phone]. Patient is requesting to pick up asap. Please call when it is ready.

## 2015-09-08 NOTE — Telephone Encounter (Signed)
LMTC.  Letter will be ready after lunch today.  I need to know if she wants to pick it up here at the office, or if she wants me to mail it to her/fim

## 2015-09-09 NOTE — Telephone Encounter (Signed)
VM full/fim 

## 2015-09-15 NOTE — Telephone Encounter (Signed)
I have made mult. phone calls to pt. that have not been returned.  Today I have mailed her letter confirming that she is under Dr. Garth Bigness care for the tx. of MS/fim

## 2015-09-22 ENCOUNTER — Ambulatory Visit (INDEPENDENT_AMBULATORY_CARE_PROVIDER_SITE_OTHER): Payer: Medicaid Other | Admitting: Neurology

## 2015-09-22 ENCOUNTER — Encounter: Payer: Self-pay | Admitting: Neurology

## 2015-09-22 VITALS — BP 145/81 | HR 61 | Ht 63.0 in | Wt 127.8 lb

## 2015-09-22 DIAGNOSIS — R202 Paresthesia of skin: Secondary | ICD-10-CM | POA: Diagnosis not present

## 2015-09-22 DIAGNOSIS — R269 Unspecified abnormalities of gait and mobility: Secondary | ICD-10-CM | POA: Diagnosis not present

## 2015-09-22 DIAGNOSIS — Z9889 Other specified postprocedural states: Secondary | ICD-10-CM

## 2015-09-22 DIAGNOSIS — G35 Multiple sclerosis: Secondary | ICD-10-CM | POA: Diagnosis not present

## 2015-09-22 DIAGNOSIS — Z79899 Other long term (current) drug therapy: Secondary | ICD-10-CM | POA: Diagnosis not present

## 2015-09-22 DIAGNOSIS — G4721 Circadian rhythm sleep disorder, delayed sleep phase type: Secondary | ICD-10-CM | POA: Diagnosis not present

## 2015-09-22 DIAGNOSIS — R200 Anesthesia of skin: Secondary | ICD-10-CM

## 2015-09-22 DIAGNOSIS — Z8679 Personal history of other diseases of the circulatory system: Secondary | ICD-10-CM

## 2015-09-22 NOTE — Progress Notes (Signed)
[facility]  PATIENT: Darlene Mcconnell DOB: 06-Nov-1956  REFERRING DOCTOR OR PCP:  Tawanna Sat SOURCE: patient  _________________________________   HISTORICAL  CHIEF COMPLAINT:  Chief Complaint  Patient presents with  . Multiple Sclerosis    Patient is on Aubagio. Patient states the injection in her right shoulder helped for a little while. She has no new complaints.     HISTORY OF PRESENT ILLNESS:  Darlene Mcconnell is a 59 year old woman who was diagnosed with MS in July 2016.  Since I last saw her she had an episode of severe right hand numbness x 10 minutes that improved after she shook her arm.   She hasnot had nay longer periods of numbness.   She started Aubagio 3 weeks ago and feels she is tolerating it ok though she feels slightly sleepy.     Gait/strength/sensation: She notes that her gait is sometimes off balance, especially if it is dark. She denies any significant weakness but right hand doesn't always work. She continues has intermittent numbness in the right hand but she no longer has the constant numbness that was present 6 months ago.  Bladder/bowel: She notes some urinary frequency, especially if she drinks little bit more. She currently has nocturia 2-3 times nightly but used to have 0 or 1 times. She has not had any urinary incontinence. No change in mild hesitancy.  Bowel function is fine.  Vision: She denies any blurriness with vision when she wears her glasses. She denies any diplopia.  Fatigue/sleep: She notes some fatigue and is sometimes excessively sleepy. She feels more tired if she has to use her right arm a lot. She is sleeping better than at last visit.   She stays up late and sleeps from 1 or 2 am to 9 or 10 am  Mood/cognition: She denies any depression or anxiety. She feels her cognition is generally doing well and she feels she is thinking about the same as she did a few years ago. She tries to read daily.  Right  shoulder and neck pain: She reports pain in the right shoulder and the adjacent neck. Pain is worse when she raises the arm or holds it backwards but ROM is complete.     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 This was felt to be symptomatic as she had eyelid drooping and she also had severe headaches though there was no definite evidence of rupture. Surgery was performed by Dr. Christella Noa.  She has two cousins with MS. One is bedridden and the other is doing well.   MS History:    She began to experience numbness in the right arm in early 2016. She noted numbness in the hand and higher up. A couple months later she began to have some pain in the shoulder and the neck area. On 05/07/2015, she had an MRI of the cervical spine that I personally reviewed. It shows a small focus of hyperintense signal adjacent to C2-C3 most evident on inversion recovery images but more subtly present on axial images as well. It was suggestive of MS and an MRI of the brain was performed on 06/09/2015. I personally reviewed those images. That shows multiple white matter foci in a pattern consistent with the diagnosis of MS. Specifically, foci of present in the periventricular and juxtacortical white matter. There is one enhancing focus in the right occipital lobe. The presence of an enhancing lesion along with  chronic periventricular and juxtacortical lesions and the presence of the spinal cord plaque allow [place] to make the diagnosis of multiple sclerosis with relative certainty.  REVIEW OF SYSTEMS: Constitutional: No fevers, chills, sweats, or change in appetite.   She notes some fatigue and mild sleepiness.  She has a delayed phase sleep disorder Eyes: No visual changes, double vision, eye pain Ear, nose and throat: No hearing loss, ear pain, nasal congestion, sore throat Cardiovascular: No chest pain, palpitations Respiratory: No shortness of breath  at rest or with exertion.   No wheezes GastrointestinaI: No nausea, vomiting, diarrhea, abdominal pain, fecal incontinence Genitourinary: No dysuria, urinary retention or frequency.  No nocturia. Musculoskeletal: No neck pain, back pain Integumentary: No rash, pruritus, skin lesions Neurological: as above Psychiatric: No depression at this time.  No anxiety Endocrine: No palpitations, diaphoresis, change in appetite, change in weigh or increased thirst Hematologic/Lymphatic: No anemia, purpura, petechiae. Allergic/Immunologic: No itchy/runny eyes, nasal congestion, recent allergic reactions, rashes  ALLERGIES: No Known Allergies  HOME MEDICATIONS:  Current outpatient prescriptions:  .  lisinopril (PRINIVIL,ZESTRIL) 10 MG tablet, Take 1 tablet (10 mg total) by mouth daily., Disp: 90 tablet, Rfl: 3 .  naproxen (NAPROSYN) 500 MG tablet, TAKE 1 TABLET BY MOUTH TWICE A DAY WITH A MEAL FOR 5 DAYS THEN TWICE A DAY AS NEEDED (Patient taking differently: TAKE 1 TABLET BY MOUTH TWICE A DAY WITH A MEAL FOR 5 DAYS THEN TWICE A DAY AS NEEDED PAIN), Disp: 30 tablet, Rfl: 0 .  Teriflunomide (AUBAGIO) 14 MG TABS, Take 14 mg by mouth daily., Disp: , Rfl:   PAST MEDICAL HISTORY: Past Medical History  Diagnosis Date  . Hypertension   . Migraine   . Intracranial aneurysm 2013    left  . Multiple sclerosis   . Neuromuscular disorder   . Amyotrophic lateral sclerosis/progressive muscular atrophy     PAST SURGICAL HISTORY: Past Surgical History  Procedure Laterality Date  . Craniotomy  08/17/2012    Procedure: CRANIOTOMY INTRACRANIAL ANEURYSM FOR CAROTID;  Surgeon: Winfield Cunas, MD;  Location: [facility];  Service: Neurosurgery;  Laterality: Left;  Craniotomy for Aneurysm Clipping    FAMILY HISTORY: Family History  Problem Relation Age of Onset  . Dementia      SOCIAL HISTORY:  Social History   Social History  . Marital Status: Legally Separated    Spouse Name: N/A  . Number of  Children: N/A  . Years of Education: N/A   Occupational History  . Not on file.   Social History Main Topics  . Smoking status: Current Every Day Smoker -- 0.33 packs/day for 12 years    Types: Cigarettes  . Smokeless tobacco: Not on file  . Alcohol Use: 3.6 oz/week    6 Cans of beer per week     Comment: Beer (3-4) every night when she gets off work.   . Drug Use: Yes    Special: Marijuana     Comment: former  . Sexual Activity: Not on file   Other Topics Concern  . Not on file   Social History Narrative   Patient does not drink caffeine.   Patient is right handed.      PHYSICAL EXAM  Filed Vitals:   09/22/15 1042  BP: 145/81  Pulse: 61  Height: 5\' 3"  (1.6 m)  Weight: 127 lb 12.8 oz (57.97 kg)    Body mass index is 22.64 kg/(m^2).   General: The patient is well-developed and well-nourished and in no  acute distress  Musculoskeletal: Back is non-tender. She is moderately tender over the subacromial bursa on the right and mildly tender over the glenohumeral joint of the shoulder.  Neurologic Exam  Mental status: The patient is alert and oriented x 3 at the time of the examination. The patient has apparent normal recent and remote memory, with an apparently normal attention span and concentration ability. Speech is normal.  Cranial nerves: Extraocular movements are full. Facial symmetry is present. There is good facial sensation to soft touch bilaterally.Facial strength is normal. Trapezius and sternocleidomastoid strength is normal. No dysarthria is noted. The tongue is midline, and the patient has symmetric elevation of the soft palate. No obvious hearing deficits are noted.  Motor: Muscle bulk is normal. Tone is normal. Strength is 5 / 5 in all 4 extremities.   Sensory: Sensory testing is intact to pinprick, soft touch and vibration sensation in all 4 extremities.  Coordination: Cerebellar testing reveals good finger-nose-finger and heel-to-shin  bilaterally.  Gait and station: Station is normal. Gait is normal. Tandem gait is mildly wide. Romberg is negative.   Reflexes: Deep tendon reflexes are symmetric but increased at the knees with spread to the other side. Plantar responses are flexor.    DIAGNOSTIC DATA (LABS, IMAGING, TESTING) - I reviewed patient records, labs, notes, testing and imaging myself where available.  Lab Results  Component Value Date   WBC 14.3* 06/25/2015   HGB 11.7* 03/04/2014   HCT 37.9 06/25/2015   MCV 99.7 03/04/2014   PLT 237 03/04/2014      Component Value Date/Time   NA 140 03/20/2015 0943   K 4.4 03/20/2015 0943   CL 101 03/20/2015 0943   CO2 27 03/20/2015 0943   GLUCOSE 71 03/20/2015 0943   BUN 13 03/20/2015 0943   CREATININE 0.77 06/09/2015 1530   CREATININE 0.79 03/20/2015 0943   CALCIUM 10.5 03/20/2015 0943   PROT 7.4 06/25/2015 1147   PROT 7.1 08/17/2012 0405   ALBUMIN 3.7 08/17/2012 0405   AST 16 06/25/2015 1147   ALT 12 06/25/2015 1147   ALKPHOS 87 06/25/2015 1147   BILITOT 0.6 06/25/2015 1147   BILITOT 0.3 08/17/2012 0405   GFRNONAA >60 06/09/2015 1530   GFRAA >60 06/09/2015 1530   Lab Results  Component Value Date   CHOL 245* 11/05/2009   HDL 98 11/05/2009   LDLCALC 131* 11/05/2009   TRIG 80 11/05/2009   CHOLHDL 2.5 Ratio 11/05/2009   No results found for: HGBA1C No results found for: VITAMINB12 Lab Results  Component Value Date   TSH 1.490 11/05/2009       ASSESSMENT AND PLAN  Multiple sclerosis - Plan: Hepatic Function Panel, CBC with Differential/Platelet  Gait disturbance  High risk medication use - Plan: Hepatic Function Panel, CBC with Differential/Platelet  History of cerebral aneurysm repair  Numbness and tingling in right hand  Delayed sleep phase syndrome    1.  She will continue Aubagio. We will check a liver function tests and CBC today and check monthly liver function tests for the next 5 months. 2.  She is to remain active and  exercises tolerated. 3.   She will return to see me in 4 months or sooner if problems.   Richard A. Felecia Shelling, MD, PhD 08/20/538, 76:73 AM Certified in Neurology, Clinical Neurophysiology, Sleep Medicine, Pain Medicine and Neuroimaging  [facility] [address], [place], [place] [address] [phone]

## 2015-09-23 ENCOUNTER — Telehealth: Payer: Self-pay

## 2015-09-23 LAB — CBC WITH DIFFERENTIAL/PLATELET
Basophils Absolute: 0 10*3/uL (ref 0.0–0.2)
Basos: 0 %
EOS (ABSOLUTE): 0.1 10*3/uL (ref 0.0–0.4)
EOS: 1 %
HEMATOCRIT: 38 % (ref 34.0–46.6)
HEMOGLOBIN: 12.7 g/dL (ref 11.1–15.9)
Immature Grans (Abs): 0 10*3/uL (ref 0.0–0.1)
Immature Granulocytes: 0 %
LYMPHS ABS: 2.7 10*3/uL (ref 0.7–3.1)
Lymphs: 29 %
MCH: 34.7 pg — ABNORMAL HIGH (ref 26.6–33.0)
MCHC: 33.4 g/dL (ref 31.5–35.7)
MCV: 104 fL — ABNORMAL HIGH (ref 79–97)
MONOCYTES: 9 %
Monocytes Absolute: 0.9 10*3/uL (ref 0.1–0.9)
NEUTROS PCT: 61 %
Neutrophils Absolute: 5.5 10*3/uL (ref 1.4–7.0)
Platelets: 265 10*3/uL (ref 150–379)
RBC: 3.66 x10E6/uL — ABNORMAL LOW (ref 3.77–5.28)
RDW: 14.6 % (ref 12.3–15.4)
WBC: 9.3 10*3/uL (ref 3.4–10.8)

## 2015-09-23 LAB — HEPATIC FUNCTION PANEL
ALT: 10 IU/L (ref 0–32)
AST: 17 IU/L (ref 0–40)
Albumin: 4.1 g/dL (ref 3.5–5.5)
Alkaline Phosphatase: 82 IU/L (ref 39–117)
BILIRUBIN, DIRECT: 0.12 mg/dL (ref 0.00–0.40)
Bilirubin Total: 0.3 mg/dL (ref 0.0–1.2)
TOTAL PROTEIN: 6.4 g/dL (ref 6.0–8.5)

## 2015-09-23 NOTE — Telephone Encounter (Signed)
Spoke to patient. Gave lab results. Patient verbalized understanding.  

## 2015-09-23 NOTE — Telephone Encounter (Signed)
-----   Message from Britt Bottom, MD sent at 09/23/2015  8:26 AM EDT ----- Please let her know the labs are okay.

## 2015-09-25 ENCOUNTER — Ambulatory Visit: Payer: Self-pay | Admitting: Neurology

## 2015-11-13 ENCOUNTER — Ambulatory Visit: Payer: Medicaid Other | Admitting: Family Medicine

## 2015-11-14 ENCOUNTER — Other Ambulatory Visit: Payer: Self-pay | Admitting: *Deleted

## 2015-11-14 DIAGNOSIS — G35 Multiple sclerosis: Secondary | ICD-10-CM

## 2015-11-14 DIAGNOSIS — Z79899 Other long term (current) drug therapy: Secondary | ICD-10-CM

## 2015-11-14 MED ORDER — SULFAMETHOXAZOLE-TRIMETHOPRIM 800-160 MG PO TABS: 1.0000 | ORAL_TABLET | Freq: Two times a day (BID) | ORAL | Status: DC

## 2015-11-17 ENCOUNTER — Other Ambulatory Visit: Payer: Self-pay | Admitting: *Deleted

## 2015-11-17 MED ORDER — TERIFLUNOMIDE 14 MG PO TABS: 14.0000 mg | ORAL_TABLET | Freq: Every day | ORAL | Status: DC

## 2015-11-17 NOTE — Telephone Encounter (Signed)
Received faxed request for Aubagio refill from [facility].  Request completed, faxed back to [facility] at fax # [phone].  Fax confirmation received/fim

## 2015-11-18 ENCOUNTER — Other Ambulatory Visit (INDEPENDENT_AMBULATORY_CARE_PROVIDER_SITE_OTHER): Payer: Self-pay

## 2015-11-18 DIAGNOSIS — G35 Multiple sclerosis: Secondary | ICD-10-CM

## 2015-11-18 DIAGNOSIS — Z79899 Other long term (current) drug therapy: Secondary | ICD-10-CM

## 2015-11-18 DIAGNOSIS — Z0289 Encounter for other administrative examinations: Secondary | ICD-10-CM

## 2015-11-19 ENCOUNTER — Telehealth: Payer: Self-pay | Admitting: *Deleted

## 2015-11-19 LAB — HEPATIC FUNCTION PANEL
ALBUMIN: 4.5 g/dL (ref 3.5–5.5)
ALK PHOS: 78 IU/L (ref 39–117)
ALT: 12 IU/L (ref 0–32)
AST: 18 IU/L (ref 0–40)
BILIRUBIN, DIRECT: 0.15 mg/dL (ref 0.00–0.40)
Bilirubin Total: 0.6 mg/dL (ref 0.0–1.2)
TOTAL PROTEIN: 6.9 g/dL (ref 6.0–8.5)

## 2015-11-19 NOTE — Telephone Encounter (Signed)
Per Dr Felecia Shelling, spoke with patient and informed her that her liver tests look normal. She verbalized understanding, appreciation for call.

## 2015-11-19 NOTE — Telephone Encounter (Signed)
-----   Message from Britt Bottom, MD sent at 11/19/2015  1:25 PM EST ----- Pleas let her know the liver tests look normal

## 2015-12-04 ENCOUNTER — Telehealth: Payer: Self-pay

## 2015-12-04 NOTE — Telephone Encounter (Signed)
Medicaid has approved the request for continuation of coverage on Aubagio effective until 11/26/2016 Ref # [id number]

## 2016-01-15 ENCOUNTER — Other Ambulatory Visit: Payer: Self-pay | Admitting: Neurology

## 2016-01-15 ENCOUNTER — Other Ambulatory Visit (INDEPENDENT_AMBULATORY_CARE_PROVIDER_SITE_OTHER): Payer: Self-pay

## 2016-01-15 DIAGNOSIS — Z0289 Encounter for other administrative examinations: Secondary | ICD-10-CM

## 2016-01-15 DIAGNOSIS — G35 Multiple sclerosis: Secondary | ICD-10-CM

## 2016-01-16 LAB — HEPATIC FUNCTION PANEL
ALT: 15 IU/L (ref 0–32)
AST: 20 IU/L (ref 0–40)
Albumin: 4.4 g/dL (ref 3.5–5.5)
Alkaline Phosphatase: 72 IU/L (ref 39–117)
Bilirubin Total: 0.4 mg/dL (ref 0.0–1.2)
Bilirubin, Direct: 0.14 mg/dL (ref 0.00–0.40)
TOTAL PROTEIN: 7 g/dL (ref 6.0–8.5)

## 2016-01-19 ENCOUNTER — Encounter: Payer: Self-pay | Admitting: *Deleted

## 2016-01-20 ENCOUNTER — Telehealth: Payer: Self-pay | Admitting: *Deleted

## 2016-01-20 NOTE — Telephone Encounter (Signed)
-----   Message from Britt Bottom, MD sent at 01/19/2016  4:23 PM EST ----- Please let her know that the liver tests are normal.

## 2016-01-20 NOTE — Telephone Encounter (Signed)
I have spoken with Sifa this morning and per RAS, advised that lft's were normal.  She verbalized understanding of same/fim

## 2016-01-22 ENCOUNTER — Ambulatory Visit: Payer: Self-pay | Admitting: Neurology

## 2016-01-27 ENCOUNTER — Ambulatory Visit (INDEPENDENT_AMBULATORY_CARE_PROVIDER_SITE_OTHER): Payer: Medicaid Other | Admitting: Neurology

## 2016-01-27 ENCOUNTER — Encounter: Payer: Self-pay | Admitting: Neurology

## 2016-01-27 VITALS — BP 124/72 | HR 68 | Resp 16 | Ht 63.0 in | Wt 124.0 lb

## 2016-01-27 DIAGNOSIS — R3989 Other symptoms and signs involving the genitourinary system: Secondary | ICD-10-CM

## 2016-01-27 DIAGNOSIS — Z8679 Personal history of other diseases of the circulatory system: Secondary | ICD-10-CM | POA: Diagnosis not present

## 2016-01-27 DIAGNOSIS — Z79899 Other long term (current) drug therapy: Secondary | ICD-10-CM | POA: Diagnosis not present

## 2016-01-27 DIAGNOSIS — R269 Unspecified abnormalities of gait and mobility: Secondary | ICD-10-CM | POA: Diagnosis not present

## 2016-01-27 DIAGNOSIS — Z9889 Other specified postprocedural states: Secondary | ICD-10-CM | POA: Diagnosis not present

## 2016-01-27 DIAGNOSIS — R300 Dysuria: Secondary | ICD-10-CM

## 2016-01-27 DIAGNOSIS — R350 Frequency of micturition: Secondary | ICD-10-CM | POA: Diagnosis not present

## 2016-01-27 DIAGNOSIS — M25511 Pain in right shoulder: Secondary | ICD-10-CM

## 2016-01-27 DIAGNOSIS — G4721 Circadian rhythm sleep disorder, delayed sleep phase type: Secondary | ICD-10-CM

## 2016-01-27 DIAGNOSIS — G35 Multiple sclerosis: Secondary | ICD-10-CM | POA: Diagnosis not present

## 2016-01-27 NOTE — Progress Notes (Signed)
[facility]  PATIENT: Darlene Mcconnell DOB: [birth year]  REFERRING DOCTOR OR PCP:  Tawanna Sat SOURCE: patient  _________________________________   HISTORICAL  CHIEF COMPLAINT:  Chief Complaint  Patient presents with  . Multiple Sclerosis    Sts. she continues to tolerate Aubagio well.  She is having more trouble with urinary frequency, ? stress incontinence, dysuria.  She began having these sx. in November and RAS gave a course of Septra DS.  She sts. dysuria resolved with this, but returns intermittently, and frequency and incontinence never resolved./fim    HISTORY OF PRESENT ILLNESS:  Darlene Mcconnell is a 60 year old woman who was diagnosed with MS in July 2016.  Over the last month, she has had a lot more urinary frequency.    She started Aubagio last year and feels she is tolerating it ok though she feels slightly lightheaded at times.      Gait/strength/sensation: She notes that her gait is doing ok --- sometimes she feels slightly off balance.    She is walking better when dark now.   She is exercising in a gym.  . She denies any significant weakness but right hand gets numb at times but numbness was constant at the outset of hr MS.     Bladder/bowel: She has a lot of urinary frequency and some incontinence the past month.   Bowel function is fine.  Vision: She denies any blurriness with vision when she wears her glasses. She denies any diplopia.  Fatigue/sleep: She notes fatigue and is sometimes excessively sleepy. She feelsshe tires out rapidly with physical activity and sometimes gets short of breath.    She feels more tired if she has to use her right arm a lot. She is sleeping better than at last visit.   She stays up late and sleeps from 1 or 2 am to 9:30 am  Mood/cognition: She denies any depression or anxiety, but she gets aggravated easily. She feels her cognition is generally doing well. She tries to read daily.  Right shoulder and  neck pain: She reports pain in the right shoulder and the adjacent neck. Pain is worse when she raises the arm or holds it backwards but ROM is complete.   Subacromial bursa injections help x 2 months or so.    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 This was felt to be symptomatic as she had eyelid drooping and she also had severe headaches though there was no definite evidence of rupture. Surgery was performed by Dr. Christella Noa.  She has two cousins with MS. One is bedridden and the other is doing well.   MS History:    She began to experience numbness in the right arm in early 2016. She noted numbness in the hand and higher up. A couple months later she began to have some pain in the shoulder and the neck area. On 05/07/2015, she had an MRI of the cervical spine that I personally reviewed. It shows a small focus of hyperintense signal adjacent to C2-C3 most evident on inversion recovery images but more subtly present on axial images as well. It was suggestive of MS and an MRI of the brain was performed on 06/09/2015. I personally reviewed those images. That shows multiple white matter foci in a pattern consistent with the diagnosis of MS. Specifically, foci of present in the periventricular and juxtacortical white matter. There is one enhancing focus in the right  occipital lobe. The presence of an enhancing lesion along with chronic periventricular and juxtacortical lesions and the presence of the spinal cord plaque allow [place] to make the diagnosis of multiple sclerosis with relative certainty.  REVIEW OF SYSTEMS: Constitutional: No fevers, chills, sweats, or change in appetite.   She notes some fatigue and mild sleepiness.  She has a delayed phase sleep disorder Eyes: No visual changes, double vision, eye pain Ear, nose and throat: No hearing loss, ear pain, nasal congestion, sore throat Cardiovascular: No chest pain,  palpitations Respiratory: No shortness of breath at rest or with exertion.   No wheezes GastrointestinaI: No nausea, vomiting, diarrhea, abdominal pain, fecal incontinence Genitourinary: No dysuria, urinary retention or frequency.  No nocturia. Musculoskeletal: No neck pain, back pain Integumentary: No rash, pruritus, skin lesions Neurological: as above Psychiatric: No depression at this time.  No anxiety Endocrine: No palpitations, diaphoresis, change in appetite, change in weigh or increased thirst Hematologic/Lymphatic: No anemia, purpura, petechiae. Allergic/Immunologic: No itchy/runny eyes, nasal congestion, recent allergic reactions, rashes  ALLERGIES: No Known Allergies  HOME MEDICATIONS:  Current outpatient prescriptions:  .  lisinopril (PRINIVIL,ZESTRIL) 10 MG tablet, Take 1 tablet (10 mg total) by mouth daily., Disp: 90 tablet, Rfl: 3 .  Teriflunomide (AUBAGIO) 14 MG TABS, Take 14 mg by mouth daily., Disp: 90 tablet, Rfl: 3 .  naproxen (NAPROSYN) 500 MG tablet, TAKE 1 TABLET BY MOUTH TWICE A DAY WITH A MEAL FOR 5 DAYS THEN TWICE A DAY AS NEEDED (Patient not taking: Reported on 01/27/2016), Disp: 30 tablet, Rfl: 0 .  sulfamethoxazole-trimethoprim (BACTRIM DS,SEPTRA DS) 800-160 MG tablet, Take 1 tablet by mouth 2 (two) times daily. (Patient not taking: Reported on 01/27/2016), Disp: 14 tablet, Rfl: 0  PAST MEDICAL HISTORY: Past Medical History  Diagnosis Date  . Hypertension   . Migraine   . Intracranial aneurysm 2013    left  . Multiple sclerosis ([place])   . Neuromuscular disorder ([place])   . Amyotrophic lateral sclerosis/progressive muscular atrophy ([place])     PAST SURGICAL HISTORY: Past Surgical History  Procedure Laterality Date  . Craniotomy  08/17/2012    Procedure: CRANIOTOMY INTRACRANIAL ANEURYSM FOR CAROTID;  Surgeon: Winfield Cunas, MD;  Location: [facility] [facility];  Service: Neurosurgery;  Laterality: Left;  Craniotomy for Aneurysm Clipping    FAMILY  HISTORY: Family History  Problem Relation Age of Onset  . Dementia      SOCIAL HISTORY:  Social History   Social History  . Marital Status: Legally Separated    Spouse Name: N/A  . Number of Children: N/A  . Years of Education: N/A   Occupational History  . Not on file.   Social History Main Topics  . Smoking status: Current Every Day Smoker -- 0.33 packs/day for 12 years    Types: Cigarettes  . Smokeless tobacco: Not on file  . Alcohol Use: 3.6 oz/week    6 Cans of beer per week     Comment: Beer (3-4) every night when she gets off work.   . Drug Use: Yes    Special: Marijuana     Comment: former  . Sexual Activity: Not on file   Other Topics Concern  . Not on file   Social History Narrative   Patient does not drink caffeine.   Patient is right handed.      PHYSICAL EXAM  Filed Vitals:   01/27/16 0919  BP: 124/72  Pulse: 68  Resp: 16  Height: 5\' 3"  (1.6 m)  Weight: 124 lb (56.246 kg)    Body mass index is 21.97 kg/(m^2).   General: The patient is well-developed and well-nourished and in no acute distress  Musculoskeletal: She is moderately tender over the right on the right and mildly tender over the glenohumeral joint of the shoulder.  Neurologic Exam  Mental status: The patient is alert and oriented x 3 at the time of the examination. The patient has apparent normal recent and remote memory, with an apparently normal attention span and concentration ability. Speech is normal.  Cranial nerves: Extraocular movements are full. Facial symmetry is present. There is good facial sensation to soft touch bilaterally.Facial strength is normal. Trapezius and sternocleidomastoid strength is normal. No dysarthria is noted. The tongue is midline, and the patient has symmetric elevation of the soft palate. No obvious hearing deficits are noted.  Motor: Muscle bulk is normal. Tone is normal. Strength is 5 / 5 in all 4 extremities.   Sensory: Sensory  testing is intact to pinprick, soft touch and vibration sensation in all 4 extremities.  Coordination: Cerebellar testing reveals good finger-nose-finger and heel-to-shin bilaterally.  Gait and station: Station is normal. Gait is normal. Tandem gait is mildly wide. Romberg is negative.   Reflexes: Deep tendon reflexes are symmetric but increased at the knees with spread to the other side. Plantar responses are flexor.    DIAGNOSTIC DATA (LABS, IMAGING, TESTING) - I reviewed patient records, labs, notes, testing and imaging myself where available.  Lab Results  Component Value Date   WBC 9.3 09/22/2015   HGB 11.7* 03/04/2014   HCT 38.0 09/22/2015   MCV 104* 09/22/2015   PLT 265 09/22/2015      Component Value Date/Time   NA 140 03/20/2015 0943   K 4.4 03/20/2015 0943   CL 101 03/20/2015 0943   CO2 27 03/20/2015 0943   GLUCOSE 71 03/20/2015 0943   BUN 13 03/20/2015 0943   CREATININE 0.77 06/09/2015 1530   CREATININE 0.79 03/20/2015 0943   CALCIUM 10.5 03/20/2015 0943   PROT 7.0 01/15/2016 1330   PROT 7.1 08/17/2012 0405   ALBUMIN 4.4 01/15/2016 1330   ALBUMIN 3.7 08/17/2012 0405   AST 20 01/15/2016 1330   ALT 15 01/15/2016 1330   ALKPHOS 72 01/15/2016 1330   BILITOT 0.4 01/15/2016 1330   BILITOT 0.3 08/17/2012 0405   GFRNONAA >60 06/09/2015 1530   GFRAA >60 06/09/2015 1530   Lab Results  Component Value Date   CHOL 245* 11/05/2009   HDL 98 11/05/2009   LDLCALC 131* 11/05/2009   TRIG 80 11/05/2009   CHOLHDL 2.5 Ratio 11/05/2009   No results found for: HGBA1C No results found for: VITAMINB12 Lab Results  Component Value Date   TSH 1.490 11/05/2009       ASSESSMENT AND PLAN  Urinary frequency - Plan: Urinalysis with Reflex Microscopic, Urine culture  Dysuria - Plan: Urinalysis with Reflex Microscopic, Urine culture  Abnormal urine color - Plan: Urinalysis with Reflex Microscopic, Urine culture  Multiple sclerosis (HCC)  Gait disturbance  High  risk medication use    1.  She will continue Aubagio. Labs are good. 2.  She is to remain active and exercises tolerated. 3.   Right subacromial bursa injection with 40 mg Depo-Medrol in 2.5 mL Marcaine using sterile technique. She tolerated the procedure well.    If worsens, refer to ortho. 4.   Check urinalysis and urine culture for possible UTI. I'll start antibiotic abnormal. 5.   If urine culture is  fine, consider a bladder medication for urinary frequency. 6.   Around time of next visit, we will check MRI of the brian c/s to r/o subclinicla progression.   If present, consider change in therapy. She will return to see me in 4 months or sooner if problems.   Richard A. Felecia Shelling, MD, PhD [id number], [id number] AM Certified in Neurology, Clinical Neurophysiology, Sleep Medicine, Pain Medicine and Neuroimaging  [facility] [address], [place], [place] [address] [phone]

## 2016-01-28 ENCOUNTER — Telehealth: Payer: Self-pay | Admitting: *Deleted

## 2016-01-28 LAB — URINALYSIS, ROUTINE W REFLEX MICROSCOPIC
Bilirubin, UA: NEGATIVE
GLUCOSE, UA: NEGATIVE
Ketones, UA: NEGATIVE
Leukocytes, UA: NEGATIVE
Nitrite, UA: NEGATIVE
RBC, UA: NEGATIVE
Specific Gravity, UA: 1.022 (ref 1.005–1.030)
UUROB: 0.2 mg/dL (ref 0.2–1.0)
pH, UA: 6 (ref 5.0–7.5)

## 2016-01-28 MED ORDER — OXYBUTYNIN CHLORIDE 5 MG PO TABS: 5.0000 mg | ORAL_TABLET | Freq: Two times a day (BID) | ORAL | Status: DC

## 2016-01-28 NOTE — Telephone Encounter (Signed)
-----   Message from Britt Bottom, MD sent at 01/28/2016  9:06 AM EST ----- Urinalysis was normal. Since she is having a lot of urinary frequency, left add oxybutynin 5 mg by mouth twice a day #60 #2m

## 2016-01-28 NOTE — Telephone Encounter (Signed)
I have spoken with Valeri this morning and per RAS, advised that u/a looks normal.  Per RAS, I have offered oxybutynin 5mg  po bid for urinary frequency.  She verbalized understanding of same, is agreeable with this plan.  [facility] [address] [facility] her [facility]/fim

## 2016-01-29 LAB — URINE CULTURE

## 2016-03-23 ENCOUNTER — Ambulatory Visit (INDEPENDENT_AMBULATORY_CARE_PROVIDER_SITE_OTHER): Payer: Medicaid Other | Admitting: Family Medicine

## 2016-03-23 ENCOUNTER — Encounter: Payer: Self-pay | Admitting: Family Medicine

## 2016-03-23 VITALS — BP 179/97 | HR 63 | Temp 98.1°F | Ht 63.0 in | Wt 125.0 lb

## 2016-03-23 DIAGNOSIS — N3281 Overactive bladder: Secondary | ICD-10-CM

## 2016-03-23 DIAGNOSIS — I10 Essential (primary) hypertension: Secondary | ICD-10-CM

## 2016-03-23 DIAGNOSIS — R499 Unspecified voice and resonance disorder: Secondary | ICD-10-CM

## 2016-03-23 MED ORDER — LISINOPRIL 20 MG PO TABS: 20.0000 mg | ORAL_TABLET | Freq: Every day | ORAL | Status: DC

## 2016-03-23 MED ORDER — OMEPRAZOLE 20 MG PO CPDR: 20.0000 mg | DELAYED_RELEASE_CAPSULE | Freq: Two times a day (BID) | ORAL | Status: DC

## 2016-03-23 NOTE — Progress Notes (Signed)
   Subjective:    Patient ID: Darlene Mcconnell, female    DOB: 12-07-1956, 60 y.o.   MRN: [id number]  HPI  CC: voice change  # Voice changing:  3-4 months  Getting "heavier" / deeper, hoarse  Some associated shortness of breath  Has had a chronic cough that seemed to be getting worse  Sometimes feels like tonsils are swollen  Not painful, but uncomfortable.   Friends have noticed that her voice has gotten deeper.  ROS: gets some heartburn with fried/greasy foods  # Urine symptoms  1-2 months  Has an urge to go, leaks and loses control of bladder on the way to the bathroom  Happens 1-2 times a week  Voids per day: 3 times a day  Voids per night: 1-2 times, sometimes 0  Has tried oxybutynin - prescribed by her neurologist  No burning with urination currently.  # Hypertension  uncontrolled  Last ran out of the lisinopril 2-3 months ago  No changes in vision, no HA, no CP, no SOB  PMH: Multiple sclerosis (diagnosed around June 2016) Social Hx: current smoker - 0.25-0.5 ppd, since age 17. Drinks daily, 2-4 drinks at a time after work.   Review of Systems   ROS sheet filled out by patient and positive for: unexplained weight loss, hoarseness/changing voice, trouble breathing, cough, pain with urination, leakage of urine, weakness, dizziness, frequent urination   Past medical history, surgical, family, and social history reviewed and updated in the EMR as appropriate. Objective:  BP 179/97 mmHg  Pulse 63  Temp(Src) 98.1 F (36.7 C) (Oral)  Ht 5\' 3"  (1.6 m)  Wt 125 lb (56.7 kg)  BMI 22.15 kg/m2 Vitals and nursing note reviewed  General: no apparent distress  Eyes: PERRL, EOMI ENTM: moist mucous membranes. There are no oropharyngeal lesions noted. A few large taste buds on back of tongue Neck: no thyromegaly or nodules appreciated, symmetric elevation CV: normal rate, regular rhythm, no murmurs, rubs or gallop. 2+ radial and PT pulses bilaterally    Resp: clear to auscultation bilaterally, normal effort  Assessment & Plan:  Change in voice There is not a very noticeable hoarseness during clinic today, however friends/family have noticed this complaint. Some questionable complaint of GERD symptoms. Discussed that this could be a serious complaint since it has been going on for so long, that the most concerning possibility is cancer (she is a smoker) that could be affecting the nerves controlling vocal cords. Will do a short trial (2 weeks) of PPI therapy, stressed importance of returning to clinic in 2 weeks, if showing no or little improvement will consider initial imaging/x-rays and referral to ENT for laryngoscopy.   Overactive bladder Likely overactive bladder. Oxybutynin did not show much improvement. Discussed behavioral modification including scheduled bathroom breaks being first line treatment. She will try this and discuss at 2 week follow up. Instructed she can discontinue oxybutynin since it didn't help anyway.  Essential hypertension, benign Uncontrolled, has been off medications x 2-3 months. Collect BMP today, refill increased lisinopril 20mg  daily.

## 2016-03-23 NOTE — Patient Instructions (Signed)
Omeprazole: take 1 tablet twice a day, the best time to take it is 20-30 minutes before you eat.  Lisinopril, increase to 20mg , take once a day. Check blood work today.  Urine symptoms: sounds like overactive bladder. Try scheduling your bathroom breaks, start with about every 4 hours. If this is not working we can discuss other medicines or referral.  Come back in about 2 weeks

## 2016-03-24 LAB — BASIC METABOLIC PANEL WITH GFR
BUN: 15 mg/dL (ref 7–25)
CHLORIDE: 104 mmol/L (ref 98–110)
CO2: 24 mmol/L (ref 20–31)
CREATININE: 0.85 mg/dL (ref 0.50–1.05)
Calcium: 9.5 mg/dL (ref 8.6–10.4)
GFR, Est African American: 87 mL/min (ref >=60)
GFR, Est Non African American: 76 mL/min (ref >=60)
Glucose, Bld: 77 mg/dL (ref 65–99)
Potassium: 4.1 mmol/L (ref 3.5–5.3)
Sodium: 140 mmol/L (ref 135–146)

## 2016-03-25 DIAGNOSIS — R499 Unspecified voice and resonance disorder: Secondary | ICD-10-CM | POA: Insufficient documentation

## 2016-03-25 DIAGNOSIS — N3281 Overactive bladder: Secondary | ICD-10-CM | POA: Insufficient documentation

## 2016-03-25 NOTE — Assessment & Plan Note (Signed)
Uncontrolled, has been off medications x 2-3 months. Collect BMP today, refill increased lisinopril 20mg  daily.

## 2016-03-25 NOTE — Assessment & Plan Note (Signed)
Likely overactive bladder. Oxybutynin did not show much improvement. Discussed behavioral modification including scheduled bathroom breaks being first line treatment. She will try this and discuss at 2 week follow up. Instructed she can discontinue oxybutynin since it didn't help anyway.

## 2016-03-25 NOTE — Assessment & Plan Note (Signed)
There is not a very noticeable hoarseness during clinic today, however friends/family have noticed this complaint. Some questionable complaint of GERD symptoms. Discussed that this could be a serious complaint since it has been going on for so long, that the most concerning possibility is cancer (she is a smoker) that could be affecting the nerves controlling vocal cords. Will do a short trial (2 weeks) of PPI therapy, stressed importance of returning to clinic in 2 weeks, if showing no or little improvement will consider initial imaging/x-rays and referral to ENT for laryngoscopy.

## 2016-04-07 ENCOUNTER — Telehealth: Payer: Self-pay | Admitting: Neurology

## 2016-04-07 NOTE — Telephone Encounter (Signed)
I have spoken with Kennyth Lose at [facility].  She sts. Aubagio has not been shipped to pt. b/c they couldn't reach her to arrange delivery.  I have spoken with Asialynn and advised she needs to call [facility] at phone # [phone] to arrange delivery.  She verbalized understanding of same/fim

## 2016-04-07 NOTE — Telephone Encounter (Signed)
Patient is calling to get a Rx for Teriflunomide (AUBAGIO) 14 MG TABS [facility] [address]. The patient says she did not receive the medication in the mail this month.

## 2016-04-07 NOTE — Telephone Encounter (Signed)
LMOM that Darlene Mcconnell is a specialty med; is not dispensed by local retail pharmacies.  It is only dispensed from specialty pharmacies.  She should call the specialty pharmacy that she receives

## 2016-04-12 ENCOUNTER — Ambulatory Visit: Payer: Medicaid Other | Admitting: Family Medicine

## 2016-05-01 ENCOUNTER — Emergency Department (HOSPITAL_COMMUNITY)
Admission: EM | Admit: 2016-05-01 | Discharge: 2016-05-02 | Disposition: A | Discharge status: Home / Self Care | Payer: Medicaid Other | Attending: Emergency Medicine | Admitting: Emergency Medicine

## 2016-05-01 ENCOUNTER — Encounter (HOSPITAL_COMMUNITY): Payer: Self-pay

## 2016-05-01 DIAGNOSIS — T783XXA Angioneurotic edema, initial encounter: Secondary | ICD-10-CM | POA: Diagnosis not present

## 2016-05-01 DIAGNOSIS — Y9289 Other specified places as the place of occurrence of the external cause: Secondary | ICD-10-CM | POA: Diagnosis not present

## 2016-05-01 DIAGNOSIS — I10 Essential (primary) hypertension: Secondary | ICD-10-CM | POA: Insufficient documentation

## 2016-05-01 DIAGNOSIS — F17210 Nicotine dependence, cigarettes, uncomplicated: Secondary | ICD-10-CM | POA: Insufficient documentation

## 2016-05-01 DIAGNOSIS — R220 Localized swelling, mass and lump, head: Secondary | ICD-10-CM | POA: Diagnosis present

## 2016-05-01 DIAGNOSIS — Y9389 Activity, other specified: Secondary | ICD-10-CM | POA: Diagnosis not present

## 2016-05-01 DIAGNOSIS — Y998 Other external cause status: Secondary | ICD-10-CM | POA: Diagnosis not present

## 2016-05-01 DIAGNOSIS — Z79899 Other long term (current) drug therapy: Secondary | ICD-10-CM | POA: Diagnosis not present

## 2016-05-01 DIAGNOSIS — G43909 Migraine, unspecified, not intractable, without status migrainosus: Secondary | ICD-10-CM | POA: Diagnosis not present

## 2016-05-01 DIAGNOSIS — G35 Multiple sclerosis: Secondary | ICD-10-CM | POA: Insufficient documentation

## 2016-05-01 DIAGNOSIS — X58XXXA Exposure to other specified factors, initial encounter: Secondary | ICD-10-CM | POA: Insufficient documentation

## 2016-05-01 MED ORDER — PREDNISONE 20 MG PO TABS
60.0000 mg | ORAL_TABLET | Freq: Once | ORAL | Status: AC
  Administered 2016-05-01: 60 mg via ORAL
  Filled 2016-05-01: qty 3

## 2016-05-01 MED ORDER — FAMOTIDINE IN NACL 20-0.9 MG/50ML-% IV SOLN
20.0000 mg | Freq: Once | INTRAVENOUS | Status: AC
  Administered 2016-05-01: 20 mg via INTRAVENOUS
  Filled 2016-05-01: qty 50

## 2016-05-01 MED ORDER — DIPHENHYDRAMINE HCL 50 MG/ML IJ SOLN
12.5000 mg | Freq: Once | INTRAMUSCULAR | Status: AC
  Administered 2016-05-01: 12.5 mg via INTRAVENOUS
  Filled 2016-05-01: qty 1

## 2016-05-01 NOTE — ED Notes (Signed)
Pt here from home with lip swelling that started at 1500 this evening. Pt denies having any allergies and takes lisinopril for BP. Air way in tact. Respirations clear. Pt alert and oriented x 4.

## 2016-05-01 NOTE — ED Notes (Signed)
Met pt, she is eating and reports that her lip swelling "is going down"

## 2016-05-01 NOTE — ED Provider Notes (Signed)
CSN: [id number]     Arrival date & time 05/01/16  2115 History   First MD Initiated Contact with Patient 05/01/16 2132     Chief Complaint  Patient presents with  . Facial Swelling  . Allergic Reaction     Patient is a 60 y.o. female presenting with allergic reaction. The history is provided by the patient.  Allergic Reaction Presenting symptoms: no difficulty swallowing   Patient presents with swelling of her upper lip. Began around 6 hours prior to arrival. Only upper lip. It is worsened somewhat over the last 6 hours. She is on lisinopril. No episodes of this in the past. No Family history of lip swelling. No localizing noon numbness or weakness.  Past Medical History  Diagnosis Date  . Hypertension   . Migraine   . Intracranial aneurysm 2013    left  . Multiple sclerosis ([place])   . Neuromuscular disorder ([employer])   . Amyotrophic lateral sclerosis/progressive muscular atrophy [facility])    Past Surgical History  Procedure Laterality Date  . Craniotomy  08/17/2012    Procedure: CRANIOTOMY INTRACRANIAL ANEURYSM FOR CAROTID;  Surgeon: Winfield Cunas, MD;  Location: [place] [facility];  Service: Neurosurgery;  Laterality: Left;  Craniotomy for Aneurysm Clipping   Family History  Problem Relation Age of Onset  . Dementia     Social History  Substance Use Topics  . Smoking status: Current Every Day Smoker -- 0.33 packs/day for 12 years    Types: Cigarettes  . Smokeless tobacco: None  . Alcohol Use: 3.6 oz/week    6 Cans of beer per week     Comment: Beer (3-4) every night when she gets off work.    OB History    Gravida Para Term Preterm AB TAB SAB Ectopic Multiple Living   3 2 2  1 1    2      Review of Systems  Constitutional: Negative for appetite change.  HENT: Positive for facial swelling. Negative for sore throat, trouble swallowing and voice change.   Respiratory: Negative for shortness of breath.   Cardiovascular: Negative for chest pain.  Gastrointestinal: Negative for  abdominal pain.  Genitourinary: Negative for flank pain.      Allergies  Lisinopril  Home Medications   Prior to Admission medications   Medication Sig Start Date End Date Taking? Authorizing Provider  Teriflunomide (AUBAGIO) 14 MG TABS Take 14 mg by mouth daily. 11/17/15  Yes Britt Bottom, MD  amLODipine (NORVASC) 5 MG tablet Take 1 tablet (5 mg total) by mouth daily. 05/02/16   Davonna Belling, MD  naproxen (NAPROSYN) 500 MG tablet TAKE 1 TABLET BY MOUTH TWICE A DAY WITH A MEAL FOR 5 DAYS THEN TWICE A DAY AS NEEDED Patient not taking: Reported on 01/27/2016 04/02/15   Leone Brand, MD  omeprazole (PRILOSEC) 20 MG capsule Take 1 capsule (20 mg total) by mouth 2 (two) times daily before a meal. Patient not taking: Reported on 05/01/2016 03/23/16   Leone Brand, MD  oxybutynin (DITROPAN) 5 MG tablet Take 1 tablet (5 mg total) by mouth 2 (two) times daily. Patient not taking: Reported on 05/01/2016 01/28/16   Britt Bottom, MD  predniSONE (DELTASONE) 20 MG tablet Take 2 tablets (40 mg total) by mouth daily. 05/02/16   Davonna Belling, MD   BP 173/94 mmHg  Pulse 92  Temp(Src) 98.3 F (36.8 C) (Oral)  Resp 18  Ht 5\' 3"  (1.6 m)  Wt 120 lb (54.432 kg)  BMI  21.26 kg/m2  SpO2 99% Physical Exam  Constitutional: She appears well-developed.  HENT:  Angioedema of upper lip symmetrically. Posterior pharynx normal. No swelling of tongue. No stridor. No respiratory distress.  Cardiovascular: Normal rate.   Pulmonary/Chest: Effort normal.  Abdominal: Soft.  Musculoskeletal: Normal range of motion.  Neurological: She is alert.    ED Course  Procedures (including critical care time) Labs Review Labs Reviewed - No data to display  Imaging Review No results found. I have personally reviewed and evaluated these images and lab results as part of my medical decision-making.   EKG Interpretation None      MDM   Final diagnoses:  Angioedema of lips, initial encounter    Patient  with angioedema of the upper lip. Stable to decreased while she's been in the ER for 3 hours. Somewhat sleepy after IV Benadryl. Will discharge home. Reviewed with patient's daughter. Will change lisinopril to Norvasc but will need to follow-up with her primary care doctor for further adjustments.    Davonna Belling, MD 05/02/16 [phone]

## 2016-05-02 MED ORDER — AMLODIPINE BESYLATE 5 MG PO TABS: 5.0000 mg | ORAL_TABLET | Freq: Every day | ORAL | Status: DC

## 2016-05-02 MED ORDER — PREDNISONE 20 MG PO TABS: 40.0000 mg | ORAL_TABLET | Freq: Every day | ORAL | Status: DC

## 2016-05-02 NOTE — Discharge Instructions (Signed)
Angioedema  Angioedema is a sudden swelling of tissues, often of the skin. It can occur on the face or genitals or in the abdomen or other body parts. The swelling usually develops over a short period and gets better in 24 to 48 hours. It often begins during the night and is found when the person wakes up. The person may also get red, itchy patches of skin (hives). Angioedema can be dangerous if it involves swelling of the air passages.   Depending on the cause, episodes of angioedema may only happen once, come back in unpredictable patterns, or repeat for several years and then gradually fade away.   CAUSES   Angioedema can be caused by an allergic reaction to various triggers. It can also result from nonallergic causes, including reactions to drugs, immune system disorders, viral infections, or an abnormal gene that is passed to you from your parents (hereditary). For some people with angioedema, the cause is unknown.   Some things that can trigger angioedema include:    Foods.    Medicines, such as ACE inhibitors, ARBs, nonsteroidal anti-inflammatory agents, or estrogen.    Latex.    Animal saliva.    Insect stings.    Dyes used in X-rays.    Mild injury.    Dental work.   Surgery.   Stress.    Sudden changes in temperature.    Exercise.  SIGNS AND SYMPTOMS    Swelling of the skin.   Hives. If these are present, there is also intense itching.   Redness in the affected area.    Pain in the affected area.   Swollen lips or tongue.   Breathing problems. This may happen if the air passages swell.   Wheezing.  If internal organs are involved, there may be:    Nausea.    Abdominal pain.    Vomiting.    Difficulty swallowing.    Difficulty passing urine.  DIAGNOSIS    Your health care provider will examine the affected area and take a medical and family history.   Various tests may be done to help determine the cause. Tests may include:   Allergy skin tests to see if the problem  is an allergic reaction.    Blood tests to check for hereditary angioedema.    Tests to check for underlying diseases that could cause the condition.    A review of your medicines, including over-the-counter medicines, may be done.  TREATMENT   Treatment will depend on the cause of the angioedema. Possible treatments include:    Removal of anything that triggered the condition (such as stopping certain medicines).    Medicines to treat symptoms or prevent attacks. Medicines given may include:     Antihistamines.     Epinephrine injection.     Steroids.    Hospitalization may be required for severe attacks. If the air passages are affected, it can be an emergency. Tubes may need to be placed to keep the airway open.  HOME CARE INSTRUCTIONS    Take all medicines as directed by your health care provider.   If you were given medicines for emergency allergy treatment, always carry them with you.   Wear a medical bracelet as directed by your health care provider.    Avoid known triggers.  SEEK MEDICAL CARE IF:    You have repeat attacks of angioedema.    Your attacks are more frequent or more severe despite preventive measures.    You have hereditary angioedema   and are considering having children. It is important to discuss with your health care provider the risks of passing the condition on to your children.  SEEK IMMEDIATE MEDICAL CARE IF:    You have severe swelling of the mouth, tongue, or lips.   You have difficulty breathing.    You have difficulty swallowing.    You faint.  MAKE SURE YOU:   Understand these instructions.   Will watch your condition.   Will get help right away if you are not doing well or get worse.     This information is not intended to replace advice given to you by your health care provider. Make sure you discuss any questions you have with your health care provider.     Document Released: 02/21/2002 Document Revised: 01/03/2015 Document Reviewed:  08/06/2013  Elsevier Interactive Patient Education 2016 [employer] Inc[employer]

## 2016-05-02 NOTE — ED Notes (Signed)
Pt verbalized understanding of d/c instructions and has no further questions. Pt stable and NAD.  

## 2016-05-25 ENCOUNTER — Other Ambulatory Visit: Payer: Self-pay | Admitting: *Deleted

## 2016-05-25 ENCOUNTER — Other Ambulatory Visit (INDEPENDENT_AMBULATORY_CARE_PROVIDER_SITE_OTHER): Payer: Self-pay

## 2016-05-25 DIAGNOSIS — Z79899 Other long term (current) drug therapy: Secondary | ICD-10-CM

## 2016-05-25 DIAGNOSIS — G35 Multiple sclerosis: Secondary | ICD-10-CM

## 2016-05-25 DIAGNOSIS — Z0289 Encounter for other administrative examinations: Secondary | ICD-10-CM

## 2016-05-26 ENCOUNTER — Ambulatory Visit: Payer: Medicaid Other | Admitting: Neurology

## 2016-05-26 LAB — HEPATIC FUNCTION PANEL
ALBUMIN: 4.1 g/dL (ref 3.5–5.5)
ALT: 15 IU/L (ref 0–32)
AST: 18 IU/L (ref 0–40)
Alkaline Phosphatase: 64 IU/L (ref 39–117)
BILIRUBIN TOTAL: 0.4 mg/dL (ref 0.0–1.2)
Bilirubin, Direct: 0.16 mg/dL (ref 0.00–0.40)
TOTAL PROTEIN: 6.5 g/dL (ref 6.0–8.5)

## 2016-05-28 ENCOUNTER — Encounter: Payer: Self-pay | Admitting: Neurology

## 2016-05-28 ENCOUNTER — Telehealth: Payer: Self-pay | Admitting: *Deleted

## 2016-05-28 NOTE — Telephone Encounter (Signed)
Message For: [facility]                  Taken  2-JUN-17 at  9:04AM by JPO ------------------------------------------------------------ Darlene Mcconnell         CID [id number]  Patient SAME                 Pt's Dr Felecia Shelling        Area Code 336 Phone# [phone]                                                                         Disp:Y/N N If Y = C/B If No Response In 90minutes ============================================================ Pt confirm appt for June 7. ds

## 2016-06-02 ENCOUNTER — Encounter: Payer: Self-pay | Admitting: Neurology

## 2016-06-02 ENCOUNTER — Ambulatory Visit (INDEPENDENT_AMBULATORY_CARE_PROVIDER_SITE_OTHER): Payer: Medicaid Other | Admitting: Neurology

## 2016-06-02 VITALS — BP 132/74 | HR 66 | Resp 14 | Ht 63.0 in | Wt 130.0 lb

## 2016-06-02 DIAGNOSIS — N3281 Overactive bladder: Secondary | ICD-10-CM

## 2016-06-02 DIAGNOSIS — Z79899 Other long term (current) drug therapy: Secondary | ICD-10-CM

## 2016-06-02 DIAGNOSIS — G35 Multiple sclerosis: Secondary | ICD-10-CM | POA: Diagnosis not present

## 2016-06-02 DIAGNOSIS — R202 Paresthesia of skin: Secondary | ICD-10-CM | POA: Diagnosis not present

## 2016-06-02 DIAGNOSIS — R200 Anesthesia of skin: Secondary | ICD-10-CM

## 2016-06-02 DIAGNOSIS — R269 Unspecified abnormalities of gait and mobility: Secondary | ICD-10-CM

## 2016-06-02 MED ORDER — MIRABEGRON ER 50 MG PO TB24
50.0000 mg | ORAL_TABLET | Freq: Every day | ORAL | Status: DC
Start: 2016-06-02 — End: 2017-10-26

## 2016-06-02 NOTE — Progress Notes (Signed)
[facility]  PATIENT: Darlene Mcconnell DOB: 08-13-1956  REFERRING DOCTOR OR PCP:  Tawanna Sat SOURCE: patient  _________________________________   HISTORICAL  CHIEF COMPLAINT:  Chief Complaint  Patient presents with  . Multiple Sclerosis    Sts. she continues to tolerate Aubagio well.  Today she would like to discuss bladder issues--sts. she is often incontinent if she can't get to the bathroom in time.  She has tried Oxybutynin but sts. it provided only minimal relief/fim    HISTORY OF PRESENT ILLNESS:  Darlene Mcconnell is a 60 year old woman who was diagnosed with MS in July 2016.  Over the last month, she has had a lot more urinary frequency.    She started Aubagio last year and feels she is tolerating it ok though she feels slightly lightheaded at times.      Gait/strength/sensation: She notes that her gait is doing the same with slightly reduced balance but no falls.  She is exercising in a gym (sit down exercises).  . She denies any significant weakness.    Her right hand is no longer numb. Right side feels differnetthan left and seems to tires out more easily    Bladder/bowel: She has a lot of urinary frequency and some incontinence the past month.  Additionally,, she often has burning.   On oxybutynin, she felt burning was slightly better but frequency and incontinence continued unchanged. She felt bladder function worsened this year.    Bowel function is fine.  Vision: She denies any blurriness with vision when she wears her glasses. She denies any diplopia.  Fatigue/sleep: She notes fatigue and also has excessive daytime slepiness. She tires out rapidly with physical activity and sometimes gets short of breath.    She feels more tired if she has to use her right arm a lot. She is sleeping better than at last visit.   She stays up late and sleeps from 1 or 2 am to 9:30 am  Mood/cognition: She denies any depression or anxiety, but she gets  aggravated easily. She feels her cognition is generally doing well. She tries to read daily.   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 This was felt to be symptomatic as she had eyelid drooping and she also had severe headaches though there was no definite evidence of rupture. Surgery was performed by Dr. Christella Noa.  She has two cousins with MS. One is bedridden and the other is doing well.   MS History:    She began to experience numbness in the right arm in early 2016. She noted numbness in the hand and higher up. A couple months later she began to have some pain in the shoulder and the neck area. On 05/07/2015, she had an MRI of the cervical spine that I personally reviewed. It shows a small focus of hyperintense signal adjacent to C2-C3 most evident on inversion recovery images but more subtly present on axial images as well. It was suggestive of MS and an MRI of the brain was performed on 06/09/2015. I personally reviewed those images. That shows multiple white matter foci in a pattern consistent with the diagnosis of MS. Specifically, foci of present in the periventricular and juxtacortical white matter. There is one enhancing focus in the right occipital lobe. The presence of an enhancing lesion along with chronic periventricular and juxtacortical lesions and the presence of the spinal cord plaque allow [place] to make the diagnosis  of multiple sclerosis with relative certainty.  REVIEW OF SYSTEMS: Constitutional: No fevers, chills, sweats, or change in appetite.   She notes some fatigue and mild sleepiness.  She has a delayed phase sleep disorder Eyes: No visual changes, double vision, eye pain Ear, nose and throat: No hearing loss, ear pain, nasal congestion, sore throat Cardiovascular: No chest pain, palpitations Respiratory: No shortness of breath at rest or with exertion.   No wheezes GastrointestinaI: No nausea,  vomiting, diarrhea, abdominal pain, fecal incontinence Genitourinary: No dysuria, urinary retention or frequency.  No nocturia. Musculoskeletal: No neck pain, back pain Integumentary: No rash, pruritus, skin lesions Neurological: as above Psychiatric: No depression at this time.  No anxiety Endocrine: No palpitations, diaphoresis, change in appetite, change in weigh or increased thirst Hematologic/Lymphatic: No anemia, purpura, petechiae. Allergic/Immunologic: No itchy/runny eyes, nasal congestion, recent allergic reactions, rashes  ALLERGIES: Allergies  Allergen Reactions  . Lisinopril     Swelling of the lip    HOME MEDICATIONS:  Current outpatient prescriptions:  .  Teriflunomide (AUBAGIO) 14 MG TABS, Take 14 mg by mouth daily., Disp: 90 tablet, Rfl: 3 .  amLODipine (NORVASC) 5 MG tablet, Take 1 tablet (5 mg total) by mouth daily. (Patient not taking: Reported on 06/02/2016), Disp: 30 tablet, Rfl: 0 .  [DISCONTINUED] lisinopril (PRINIVIL,ZESTRIL) 20 MG tablet, Take 1 tablet (20 mg total) by mouth daily. (Patient not taking: Reported on 05/01/2016), Disp: 90 tablet, Rfl: 1  PAST MEDICAL HISTORY: Past Medical History  Diagnosis Date  . Hypertension   . Migraine   . Intracranial aneurysm 2013    left  . Multiple sclerosis ([place])   . Neuromuscular disorder ([place])   . Amyotrophic lateral sclerosis/progressive muscular atrophy ([place])     PAST SURGICAL HISTORY: Past Surgical History  Procedure Laterality Date  . Craniotomy  08/17/2012    Procedure: CRANIOTOMY INTRACRANIAL ANEURYSM FOR CAROTID;  Surgeon: Winfield Cunas, MD;  Location: [facility];  Service: Neurosurgery;  Laterality: Left;  Craniotomy for Aneurysm Clipping    FAMILY HISTORY: Family History  Problem Relation Age of Onset  . Dementia      SOCIAL HISTORY:  Social History   Social History  . Marital Status: Legally Separated    Spouse Name: N/A  . Number of Children: N/A  . Years of Education: N/A    Occupational History  . Not on file.   Social History Main Topics  . Smoking status: Current Every Day Smoker -- 0.33 packs/day for 12 years    Types: Cigarettes  . Smokeless tobacco: Not on file  . Alcohol Use: 3.6 oz/week    6 Cans of beer per week     Comment: Beer (3-4) every night when she gets off work.   . Drug Use: Yes    Special: Marijuana     Comment: former  . Sexual Activity: Not on file   Other Topics Concern  . Not on file   Social History Narrative   Patient does not drink caffeine.   Patient is right handed.      PHYSICAL EXAM  Filed Vitals:   06/02/16 1346  BP: 132/74  Pulse: 66  Resp: 14  Height: 5\' 3"  (1.6 m)  Weight: 130 lb (58.968 kg)    Body mass index is 23.03 kg/(m^2).   General: The patient is well-developed and well-nourished and in no acute distress  Musculoskeletal: She has mild right shoulder tenderness and good ROM  Neurologic Exam  Mental status: The patient is  alert and oriented x 3 at the time of the examination. The patient has apparent normal recent and remote memory, with an apparently normal attention span and concentration ability. Speech is normal.  Cranial nerves: Extraocular movements are full. There is good facial sensation to soft touch bilaterally.Facial strength is normal. Trapezius and sternocleidomastoid strength is normal. No dysarthria is noted. The tongue is midline, and the patient has symmetric elevation of the soft palate. No obvious hearing deficits are noted.  Motor: Muscle bulk is normal. Tone is normal. Strength is 5 / 5 in all 4 extremities.   Sensory: Sensory testing is intact to pinprick, soft touch and vibration sensation in all 4 extremities.  Coordination: Cerebellar testing reveals good finger-nose-finger bilaterally.  Gait and station: Station is normal. Gait is normal. Tandem gait is mildly wide. Romberg is negative.   Reflexes: Deep tendon reflexes are symmetric but increased at  the knees with spread to the other side.     DIAGNOSTIC DATA (LABS, IMAGING, TESTING) - I reviewed patient records, labs, notes, testing and imaging myself where available.  Lab Results  Component Value Date   WBC 9.3 09/22/2015   HGB 11.7* 03/04/2014   HCT 38.0 09/22/2015   MCV 104* 09/22/2015   PLT 265 09/22/2015      Component Value Date/Time   NA 140 03/23/2016 1443   K 4.1 03/23/2016 1443   CL 104 03/23/2016 1443   CO2 24 03/23/2016 1443   GLUCOSE 77 03/23/2016 1443   BUN 15 03/23/2016 1443   CREATININE 0.85 03/23/2016 1443   CREATININE 0.77 06/09/2015 1530   CALCIUM 9.5 03/23/2016 1443   PROT 6.5 05/25/2016 0857   PROT 7.1 08/17/2012 0405   ALBUMIN 4.1 05/25/2016 0857   ALBUMIN 3.7 08/17/2012 0405   AST 18 05/25/2016 0857   ALT 15 05/25/2016 0857   ALKPHOS 64 05/25/2016 0857   BILITOT 0.4 05/25/2016 0857   BILITOT 0.3 08/17/2012 0405   GFRNONAA 76 03/23/2016 1443   GFRNONAA >60 06/09/2015 1530   GFRAA 87 03/23/2016 1443   GFRAA >60 06/09/2015 1530   Lab Results  Component Value Date   CHOL 245* 11/05/2009   HDL 98 11/05/2009   LDLCALC 131* 11/05/2009   TRIG 80 11/05/2009   CHOLHDL 2.5 Ratio 11/05/2009   No results found for: HGBA1C No results found for: VITAMINB12 Lab Results  Component Value Date   TSH 1.490 11/05/2009       ASSESSMENT AND PLAN  Multiple sclerosis (HCC)  Gait disturbance  Overactive bladder  High risk medication use  Numbness and tingling in right hand    1.  Continue Aubagio. Labs (LFTs over last 6 months) were good. 2.  She is to remain active and exercises tolerated. 3.   Check urinalysis and urine culture for possible UTI. I'll start antibiotic abnormal. 5.   Myrbetriq for overactive bladder --- no benefit from oxybutynin 6.   MRI of the brian c/s to r/o subclinicla progression.   If present, consider change in therapy. She will return to see me in 4 months or sooner if problems.   [place] Wank A. Felecia Shelling, MD, PhD  [id number], [id number] PM Certified in Neurology, Clinical Neurophysiology, Sleep Medicine, Pain Medicine and Neuroimaging  [facility] [address] [place], [place] [address] [phone]

## 2016-06-03 ENCOUNTER — Telehealth: Payer: Self-pay | Admitting: *Deleted

## 2016-06-03 ENCOUNTER — Other Ambulatory Visit: Payer: Self-pay | Admitting: *Deleted

## 2016-06-03 DIAGNOSIS — R350 Frequency of micturition: Secondary | ICD-10-CM

## 2016-06-03 DIAGNOSIS — R300 Dysuria: Secondary | ICD-10-CM

## 2016-06-03 LAB — SPECIMEN STATUS REPORT

## 2016-06-03 LAB — MICROSCOPIC EXAMINATION: CASTS: NONE SEEN /LPF

## 2016-06-03 LAB — URINALYSIS, ROUTINE W REFLEX MICROSCOPIC
Bilirubin, UA: NEGATIVE
Glucose, UA: NEGATIVE
Ketones, UA: NEGATIVE
Nitrite, UA: NEGATIVE
Protein, UA: NEGATIVE
Specific Gravity, UA: 1.025 (ref 1.005–1.030)
Urobilinogen, Ur: 0.2 mg/dL (ref 0.2–1.0)
pH, UA: 6 (ref 5.0–7.5)

## 2016-06-03 NOTE — Telephone Encounter (Signed)
LFT results reviewed with pt. at ov yesterday/fim

## 2016-06-03 NOTE — Telephone Encounter (Signed)
-----   Message from Britt Bottom, MD sent at 05/26/2016  8:29 AM EDT ----- Please let her know that the liver function tests look good.

## 2016-06-10 LAB — SPECIMEN STATUS REPORT

## 2016-06-14 ENCOUNTER — Other Ambulatory Visit: Payer: Self-pay | Admitting: *Deleted

## 2016-06-14 ENCOUNTER — Telehealth: Payer: Self-pay | Admitting: Neurology

## 2016-06-14 DIAGNOSIS — G35 Multiple sclerosis: Secondary | ICD-10-CM

## 2016-06-14 DIAGNOSIS — N3281 Overactive bladder: Secondary | ICD-10-CM

## 2016-06-14 NOTE — Telephone Encounter (Signed)
LMOM.  Ins. will not cover Myrbetriq./fim

## 2016-06-14 NOTE — Telephone Encounter (Signed)
Patient called to advise, [facility] is still waiting on PA for mirabegron ER (MYRBETRIQ) 50 MG TB24 tablet.

## 2016-06-14 NOTE — Telephone Encounter (Signed)
I have spoken with Darlene Mcconnell this afternoon.  Since ins. will not cover Myrbetriq, she requested Urology referral, as discussed with RAS during last ov.  Referral ordered in EPIC/fim

## 2016-06-15 ENCOUNTER — Other Ambulatory Visit: Payer: Medicaid Other

## 2016-06-15 LAB — URINE CULTURE: Organism ID, Bacteria: NO GROWTH

## 2016-06-15 LAB — SPECIMEN STATUS REPORT

## 2016-06-22 ENCOUNTER — Ambulatory Visit
Admission: RE | Admit: 2016-06-22 | Discharge: 2016-06-22 | Disposition: A | Discharge status: Home / Self Care | Payer: Medicaid Other | Source: Ambulatory Visit | Attending: Neurology | Admitting: Neurology

## 2016-06-22 DIAGNOSIS — R269 Unspecified abnormalities of gait and mobility: Secondary | ICD-10-CM | POA: Diagnosis not present

## 2016-06-22 DIAGNOSIS — R202 Paresthesia of skin: Secondary | ICD-10-CM

## 2016-06-22 DIAGNOSIS — R262 Difficulty in walking, not elsewhere classified: Secondary | ICD-10-CM | POA: Diagnosis not present

## 2016-06-22 DIAGNOSIS — R200 Anesthesia of skin: Secondary | ICD-10-CM

## 2016-06-22 DIAGNOSIS — G35 Multiple sclerosis: Secondary | ICD-10-CM | POA: Diagnosis not present

## 2016-06-22 MED ORDER — GADOBENATE DIMEGLUMINE 529 MG/ML IV SOLN
12.0000 mL | Freq: Once | INTRAVENOUS | Status: AC | PRN
  Administered 2016-06-22: 12 mL via INTRAVENOUS

## 2016-06-23 ENCOUNTER — Telehealth: Payer: Self-pay

## 2016-06-23 NOTE — Telephone Encounter (Signed)
I spoke to pt and advised her that per Dr. Felecia Shelling, no new lesions were noted on her MRI. Pt verbalized understanding of results. Pt had no questions at this time but was encouraged to call back if questions arise.

## 2016-06-23 NOTE — Telephone Encounter (Signed)
-----   Message from Britt Bottom, MD sent at 06/22/2016  6:02 PM EDT ----- Please let her know that there are no new lesions on MRI.

## 2016-06-25 ENCOUNTER — Telehealth: Payer: Self-pay | Admitting: *Deleted

## 2016-06-25 NOTE — Telephone Encounter (Signed)
-----   Message from Britt Bottom, MD sent at 06/22/2016  6:02 PM EDT ----- Please let her know that there are no new lesions on MRI.

## 2016-06-25 NOTE — Telephone Encounter (Signed)
LMOM that per RAS, no new lesions were noted on her recent mri.  She does not need to return this call unless she has questions/fim

## 2016-07-01 ENCOUNTER — Telehealth: Payer: Self-pay | Admitting: Neurology

## 2016-07-01 NOTE — Telephone Encounter (Signed)
I have spoken with [facility]on[facility] Huguenin at [facility], and advised that pt. has been referred to urology for ongoing difficulties with bladder--she is not taking Myrbetriq at this time/fim

## 2016-07-01 NOTE — Telephone Encounter (Signed)
[facility] called to get PA on mirabegron ER (MYRBETRIQ) 50 MG TB24 tablet.  Phone: [name]. They will refax form as well.

## 2016-07-09 ENCOUNTER — Other Ambulatory Visit: Payer: Self-pay | Admitting: Neurology

## 2016-07-22 ENCOUNTER — Telehealth: Payer: Self-pay | Admitting: Neurology

## 2016-07-22 NOTE — Telephone Encounter (Signed)
Pt needs PA on amLODipine (NORVASC) 5 MG tablet. Pt is out of medication. Please call pt to update. Thank you

## 2016-07-22 NOTE — Telephone Encounter (Signed)
LMOM.  Norvasc was escribed by Dr. Stefanie Libel should call his office if she is having problems with this rx./fim

## 2016-09-17 ENCOUNTER — Ambulatory Visit: Payer: Medicaid Other | Admitting: Family Medicine

## 2016-09-20 ENCOUNTER — Ambulatory Visit (INDEPENDENT_AMBULATORY_CARE_PROVIDER_SITE_OTHER): Payer: Medicaid Other | Admitting: Student

## 2016-09-20 VITALS — BP 175/82 | HR 66 | Temp 98.6°F | Wt 135.0 lb

## 2016-09-20 DIAGNOSIS — Z0000 Encounter for general adult medical examination without abnormal findings: Secondary | ICD-10-CM | POA: Insufficient documentation

## 2016-09-20 DIAGNOSIS — M25511 Pain in right shoulder: Secondary | ICD-10-CM | POA: Diagnosis not present

## 2016-09-20 DIAGNOSIS — I10 Essential (primary) hypertension: Secondary | ICD-10-CM

## 2016-09-20 MED ORDER — NAPROXEN 500 MG PO TABS: 500.0000 mg | ORAL_TABLET | Freq: Two times a day (BID) | ORAL | 0 refills | Status: DC

## 2016-09-20 MED ORDER — AMLODIPINE BESYLATE 5 MG PO TABS: 5.0000 mg | ORAL_TABLET | Freq: Every day | ORAL | 3 refills | Status: DC

## 2016-09-20 NOTE — Assessment & Plan Note (Signed)
Likely osteoarthritis. Unclear about the point tenderness in the trapezius muscle. I don't think this is related to her osteoarthritis. No sign and symptom of infection. Shoulder exam is benign except for crepitus in her right shoulder. Gave prescription for naproxen 500 mg twice a day as needed for pain. Discussed return precautions including fever, swelling or worsening of pain

## 2016-09-20 NOTE — Progress Notes (Signed)
   Subjective:    Patient ID: Darlene Mcconnell is a 60 y.o. old female.  HPI #Right shoulder pain: this has been going on for one year. Is getting worse. Worse with lying on right side. Even a fan aggravates the pain. Denies swelling. Denies trauma. Reports pain right deviation to her forearm. Reports numbness and tingling in her fingers. She also has history of MS. she is unsure if the numbness and tingling is is secondary to MS. She sees rheumatologist for MS.  Denies fever, chills, night time seating and unintential weight loss. She she used to get steroid shot in the past at her rheumatologist's office. Has not gotten in the last 4 months. She reports little benefit from this injection  #Hypertension: Hasn't taken her medication in the last 3 months. She says she ran out and couldn't get a refill.   PMH: reviewed  SH: Smokes about 7 cigarettes a day (4-pack-year). She works as Landscape architect Per HPI Objective:   Vitals:   09/20/16 1451  BP: (!) 175/82  Pulse: 66  Temp: 98.6 F (37 C)  TempSrc: Oral  SpO2: 100%  Weight: 135 lb (61.2 kg)    GEN: appears well, no apparent distress. CVS: RRR, normal s1 and s2 RESP: no increased work of breathing MSK: Shoulders appear symmetric, no overlying skin erythema or swelling, point tenderness over her trapezius muscle, no tenderness over ACJ or shoulder joint, symmetric and full range of motion in both shoulders, negative Epley and Hawkins. Crepitus in her right shoulder.  NEURO: alert and oriented appropriately, no gross defecits  PSYCH: appropriate mood and affect     Assessment & Plan:  Right shoulder pain Likely osteoarthritis. Unclear about the point tenderness in the trapezius muscle. I don't think this is related to her osteoarthritis. No sign and symptom of infection. Shoulder exam is benign except for crepitus in her right shoulder. Gave prescription for naproxen 500 mg twice a day as needed for pain. Discussed  return precautions including fever, swelling or worsening of pain  Essential hypertension, benign Blood pressure 175/82 today. She has been out of her medication for 3 months. Refilled her amlodipine today and discussed the importance of taking her blood pressure medication regularly. 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

## 2016-09-20 NOTE — Patient Instructions (Addendum)
It was great seeing you today! We have addressed the following issues today  1. Right shoulder pain: This is likely arthritis. I sent a prescription for naproxen to your pharmacy. Take this twice a day as needed for pain. Please take it with food.  2. Blood pressure: Your blood pressure is 175/82 mmHg. Your goal blood pressure is less than 140/90. I sent a prescription for amlodipine to your pharmacy. Please start taking this today. I also recommend follow-up on you blood pressure in 2 weeks.  3. Flu shots: I strongly recommend you getting flu shots especially with the kind of work you do.   If we did any lab work today, and the results require attention, either me or my nurse will get in touch with you. If everything is normal, you will get a letter in mail. If you don't hear from [place] in two weeks, please give [place] a call. Otherwise, I look forward to talking with you again at our next visit. If you have any questions or concerns before then, please call the clinic at [phone].  Please bring all your medications to every doctors visit   Sign up for My Chart to have easy access to your labs results, and communication with your Primary care physician.    Please check-out at the front desk before leaving the clinic.   Take Care,

## 2016-09-20 NOTE — Assessment & Plan Note (Addendum)
Blood pressure 175/82 today. She has been out of her medication for 3 months. Refilled her amlodipine today and discussed the importance of taking her blood pressure medication regularly. Patient to return for blood pressure check in 2 weeks

## 2016-09-20 NOTE — Assessment & Plan Note (Signed)
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

## 2016-11-04 ENCOUNTER — Ambulatory Visit: Payer: Medicaid Other | Admitting: Internal Medicine

## 2016-11-08 ENCOUNTER — Telehealth: Payer: Self-pay | Admitting: *Deleted

## 2016-11-08 NOTE — Telephone Encounter (Signed)
Aubagio PA completed and faxed to [employer] [facility]) fax# [phone]./fim

## 2016-11-12 ENCOUNTER — Encounter: Payer: Self-pay | Admitting: *Deleted

## 2016-11-25 ENCOUNTER — Other Ambulatory Visit: Payer: Self-pay | Admitting: Neurology

## 2017-02-21 ENCOUNTER — Ambulatory Visit: Payer: Medicaid Other | Admitting: Neurology

## 2017-03-25 ENCOUNTER — Encounter: Payer: Self-pay | Admitting: Neurology

## 2017-03-25 ENCOUNTER — Encounter (INDEPENDENT_AMBULATORY_CARE_PROVIDER_SITE_OTHER): Payer: Self-pay

## 2017-03-25 ENCOUNTER — Ambulatory Visit (INDEPENDENT_AMBULATORY_CARE_PROVIDER_SITE_OTHER): Payer: Medicaid Other | Admitting: Neurology

## 2017-03-25 VITALS — BP 120/78 | HR 84 | Resp 18 | Ht 63.0 in | Wt 139.0 lb

## 2017-03-25 DIAGNOSIS — R269 Unspecified abnormalities of gait and mobility: Secondary | ICD-10-CM

## 2017-03-25 DIAGNOSIS — R7989 Other specified abnormal findings of blood chemistry: Secondary | ICD-10-CM

## 2017-03-25 DIAGNOSIS — Z8679 Personal history of other diseases of the circulatory system: Secondary | ICD-10-CM

## 2017-03-25 DIAGNOSIS — G35 Multiple sclerosis: Secondary | ICD-10-CM | POA: Diagnosis not present

## 2017-03-25 DIAGNOSIS — Z9889 Other specified postprocedural states: Secondary | ICD-10-CM

## 2017-03-25 DIAGNOSIS — R5383 Other fatigue: Secondary | ICD-10-CM

## 2017-03-25 DIAGNOSIS — M25511 Pain in right shoulder: Secondary | ICD-10-CM

## 2017-03-25 DIAGNOSIS — Z79899 Other long term (current) drug therapy: Secondary | ICD-10-CM

## 2017-03-25 DIAGNOSIS — E559 Vitamin D deficiency, unspecified: Secondary | ICD-10-CM | POA: Diagnosis not present

## 2017-03-25 DIAGNOSIS — N3281 Overactive bladder: Secondary | ICD-10-CM | POA: Diagnosis not present

## 2017-03-25 HISTORY — DX: Other specified abnormal findings of blood chemistry: R79.89

## 2017-03-25 NOTE — Progress Notes (Signed)
[facility]  PATIENT: Darlene Mcconnell DOB: [birth year]  REFERRING DOCTOR OR PCP:  Tawanna Sat SOURCE: patient  _________________________________   HISTORICAL  CHIEF COMPLAINT:  Chief Complaint  Patient presents with  . Multiple Sclerosis    Sts. she continues to tolerate Aubagio well.  She was not able to afford Myrbetriq, but sts. she has seen urology, and is not currently having any problems with bladder.  Sts. fatigue is constant.  Sts. right shoulder pain is worse/fim    HISTORY OF PRESENT ILLNESS:  Darlene Mcconnell is a 61 year old woman who was diagnosed with MS in July 2016.    MS:   She is on Aubagio and tolerates it well.   No definite exacerbation.    She feels more tired.    Gait/strength/sensation: Her gait is unchanged with reduced balance.   She stumbles but no falls.    She is exercising less and denies any significant weakness.    Currently, she denies dysesthesia and the right sided numbness is minimal.   Shoulder pain:   She has right shoulder pain and feels weaker in her right arm.   Externally rotating hte shoulder is painful at times.    Bladder/bowel: She has urinary frequency but no recent incontinence.   She never started Myrbetriq.    Bowel function is fine.  Vision: She denies any blurriness with vision when she wears her glasses. She denies any diplopia.  Fatigue/sleep: She has fatigue and excessive daytime sleepiness (falls asleep in church and when relaxing).  She falls asleep easily but wakes up a lot.   She falls asleep at 11 pm and wakes up at 2 am and may not fall back asleep.  Mood/cognition: She denies any depression or anxiety, but she gets aggravated easily. She feels her cognition is generally doing well. She tries to read daily.   She has two cousins with MS. One is bedridden and the other is doing well.   MS History:    She began to experience numbness in the right arm in early 2016. She noted  numbness in the hand and higher up. A couple months later she began to have some pain in the shoulder and the neck area. On 05/07/2015, she had an MRI of the cervical spine that I personally reviewed. It shows a small focus of hyperintense signal adjacent to C2-C3 most evident on inversion recovery images but more subtly present on axial images as well. It was suggestive of MS and an MRI of the brain was performed on 06/09/2015. I personally reviewed those images. That shows multiple white matter foci in a pattern consistent with the diagnosis of MS. Specifically, foci of present in the periventricular and juxtacortical white matter. There is one enhancing focus in the right occipital lobe. The presence of an enhancing lesion along with chronic periventricular and juxtacortical lesions and the presence of the spinal cord plaque allow [place] to make the diagnosis of multiple sclerosis with relative certainty.  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 This was felt to be symptomatic as she had eyelid drooping and she also had severe headaches though there was no definite evidence of rupture. Surgery was performed by Dr. Christella Noa.    REVIEW OF SYSTEMS: Constitutional: No fevers, chills, sweats, or change in appetite.   She notes some fatigue and mild sleepiness.  She has a delayed phase sleep disorder Eyes: No visual changes, double vision,  eye pain Ear, nose and throat: No hearing loss, ear pain, nasal congestion, sore throat Cardiovascular: No chest pain, palpitations Respiratory: No shortness of breath at rest or with exertion.   No wheezes GastrointestinaI: No nausea, vomiting, diarrhea, abdominal pain, fecal incontinence Genitourinary: No dysuria, urinary retention or frequency.  No nocturia. Musculoskeletal: No neck pain, back pain Integumentary: No rash, pruritus, skin lesions Neurological: as above Psychiatric: No depression  at this time.  No anxiety Endocrine: No palpitations, diaphoresis, change in appetite, change in weigh or increased thirst Hematologic/Lymphatic: No anemia, purpura, petechiae. Allergic/Immunologic: No itchy/runny eyes, nasal congestion, recent allergic reactions, rashes  ALLERGIES: Allergies  Allergen Reactions  . Lisinopril     Swelling of the lip    HOME MEDICATIONS:  Current Outpatient Prescriptions:  .  AUBAGIO 14 MG TABS, TAKE 1 TABLET BY MOUTH DAILY, Disp: 84 tablet, Rfl: 3 .  amLODipine (NORVASC) 5 MG tablet, Take 1 tablet (5 mg total) by mouth daily. (Patient not taking: Reported on 03/25/2017), Disp: 90 tablet, Rfl: 3 .  mirabegron ER (MYRBETRIQ) 50 MG TB24 tablet, Take 1 tablet (50 mg total) by mouth daily. (Patient not taking: Reported on 03/25/2017), Disp: 30 tablet, Rfl: 11 .  naproxen (NAPROSYN) 500 MG tablet, Take 1 tablet (500 mg total) by mouth 2 (two) times daily with a meal. (Patient not taking: Reported on 03/25/2017), Disp: 30 tablet, Rfl: 0  PAST MEDICAL HISTORY: Past Medical History:  Diagnosis Date  . Amyotrophic lateral sclerosis/progressive muscular atrophy ([place])   . Hypertension   . 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 Migraine   . Multiple sclerosis ([place])   . Neuromuscular disorder ([place])     PAST SURGICAL HISTORY: Past Surgical History:  Procedure Laterality Date  . CRANIOTOMY  08/17/2012   Procedure: CRANIOTOMY INTRACRANIAL ANEURYSM FOR CAROTID;  Surgeon: Winfield Cunas, MD;  Location: [facility];  Service: Neurosurgery;  Laterality: Left;  Craniotomy for Aneurysm Clipping    FAMILY HISTORY: Family History  Problem Relation Age of Onset  . Dementia      SOCIAL HISTORY:  Social History   Social History  . Marital status: Legally Separated    Spouse name: N/A  . Number of children: N/A  . Years of education: N/A   Occupational History  . Not on file.   Social History Main Topics  . Smoking status: Current Every Day Smoker     Packs/day: 0.33    Years: 12.00    Types: Cigarettes  . Smokeless tobacco: Never Used  . Alcohol use 3.6 oz/week    6 Cans of beer per week     Comment: Beer (3-4) every night when she gets off work.   . Drug use: Yes    Types: Marijuana     Comment: former  . Sexual activity: Not on file   Other Topics Concern  . Not on file   Social History Narrative   Patient does not drink caffeine.   Patient is right handed.      PHYSICAL EXAM  Vitals:   03/25/17 0824  BP: 120/78  Pulse: 84  Resp: 18  Weight: 139 lb (63 kg)  Height: 5\' 3"  (1.6 m)    Body mass index is 24.62 kg/m.   General: The patient is well-developed and well-nourished and in no acute distress  Musculoskeletal: She has right shoulder tenderness over subacromial bursa and good ROM  Neurologic Exam  Mental status: The patient is alert and oriented x 3 at the  time of the examination. The patient has apparent normal recent and remote memory, with an apparently normal attention span and concentration ability. Speech is normal.  Cranial nerves: Extraocular movements are full. Facial strength and sensation is normal. Trapezius and sternocleidomastoid strength is normal. No dysarthria is noted. The tongue is midline, and the patient has symmetric elevation of the soft palate. No obvious hearing deficits are noted.  Motor: Muscle bulk is normal. Tone is normal. Strength is 5 / 5 in all 4 extremities.   Sensory: Sensory testing is intact to pinprick, soft touch and vibration sensation in all 4 extremities.  Coordination: Cerebellar testing reveals good finger-nose-finger bilaterally.  Gait and station: Station is normal. Gait is normal. Tandem gait is mildly wide. Romberg is negative.   Reflexes: Deep tendon reflexes are symmetric but increased at the knees with spread to the other side.     DIAGNOSTIC DATA (LABS, IMAGING, TESTING) - I reviewed patient records, labs, notes, testing and imaging  myself where available.  Lab Results  Component Value Date   WBC 9.3 09/22/2015   HGB 11.7 (L) 03/04/2014   HCT 38.0 09/22/2015   MCV 104 (H) 09/22/2015   PLT 265 09/22/2015      Component Value Date/Time   NA 140 03/23/2016 1443   K 4.1 03/23/2016 1443   CL 104 03/23/2016 1443   CO2 24 03/23/2016 1443   GLUCOSE 77 03/23/2016 1443   BUN 15 03/23/2016 1443   CREATININE 0.85 03/23/2016 1443   CALCIUM 9.5 03/23/2016 1443   PROT 6.5 05/25/2016 0857   ALBUMIN 4.1 05/25/2016 0857   AST 18 05/25/2016 0857   ALT 15 05/25/2016 0857   ALKPHOS 64 05/25/2016 0857   BILITOT 0.4 05/25/2016 0857   GFRNONAA 76 03/23/2016 1443   GFRAA 87 03/23/2016 1443   Lab Results  Component Value Date   CHOL 245 (H) 11/05/2009   HDL 98 11/05/2009   LDLCALC 131 (H) 11/05/2009   TRIG 80 11/05/2009   CHOLHDL 2.5 Ratio 11/05/2009   No results found for: HGBA1C No results found for: VITAMINB12 Lab Results  Component Value Date   TSH 1.490 11/05/2009       ASSESSMENT AND PLAN  Multiple sclerosis ([place]) - Plan: CBC with Differential/Platelet, Hepatic function panel, TSH  Gait disturbance  High risk medication use - Plan: CBC with Differential/Platelet, Hepatic function panel, VITAMIN D 25 Hydroxy (Vit-D Deficiency, Fractures), TSH  Overactive bladder  Acute pain of right shoulder  Other fatigue - Plan: TSH  Low vitamin D level - Plan: VITAMIN D 25 Hydroxy (Vit-D Deficiency, Fractures)  History of cerebral aneurysm repair   1.  Continue Aubagio.     Check labs (CBC, LFT).  As increased fatigue check TSH and Vit D (low in past) 2.  She is to remain active and exercises tolerated. 3.   Inject right subacromial bursa in the shoulder with 40 mg Depo-Medrol in [place] 4.    She will return to see me in 4 months or sooner if problems.   Cheray Pardi A. Felecia Shelling, MD, PhD 01/10/7261, 03:55 PM Certified in Neurology, Clinical Neurophysiology, Sleep Medicine, Pain Medicine and  Neuroimaging  [facility] [address] [place], [place] [address] [phone]

## 2017-03-26 LAB — CBC WITH DIFFERENTIAL/PLATELET
Basophils Absolute: 0 10*3/uL (ref 0.0–0.2)
Basos: 0 %
EOS (ABSOLUTE): 0.1 10*3/uL (ref 0.0–0.4)
EOS: 1 %
HEMATOCRIT: 40.7 % (ref 34.0–46.6)
HEMOGLOBIN: 13.3 g/dL (ref 11.1–15.9)
IMMATURE GRANS (ABS): 0 10*3/uL (ref 0.0–0.1)
Immature Granulocytes: 0 %
LYMPHS ABS: 3 10*3/uL (ref 0.7–3.1)
Lymphs: 28 %
MCH: 32.8 pg (ref 26.6–33.0)
MCHC: 32.7 g/dL (ref 31.5–35.7)
MCV: 101 fL — ABNORMAL HIGH (ref 79–97)
MONOCYTES: 9 %
Monocytes Absolute: 0.9 10*3/uL (ref 0.1–0.9)
NEUTROS ABS: 6.4 10*3/uL (ref 1.4–7.0)
Neutrophils: 62 %
Platelets: 268 10*3/uL (ref 150–379)
RBC: 4.05 x10E6/uL (ref 3.77–5.28)
RDW: 14.8 % (ref 12.3–15.4)
WBC: 10.5 10*3/uL (ref 3.4–10.8)

## 2017-03-26 LAB — HEPATIC FUNCTION PANEL
ALBUMIN: 4.3 g/dL (ref 3.5–5.5)
ALK PHOS: 68 IU/L (ref 39–117)
ALT: 11 IU/L (ref 0–32)
AST: 19 IU/L (ref 0–40)
BILIRUBIN TOTAL: 0.4 mg/dL (ref 0.0–1.2)
BILIRUBIN, DIRECT: 0.09 mg/dL (ref 0.00–0.40)
Total Protein: 7 g/dL (ref 6.0–8.5)

## 2017-03-26 LAB — TSH: TSH: 1.11 u[IU]/mL (ref 0.450–4.500)

## 2017-03-26 LAB — VITAMIN D 25 HYDROXY (VIT D DEFICIENCY, FRACTURES): Vit D, 25-Hydroxy: 5.5 ng/mL — ABNORMAL LOW (ref 30.0–100.0)

## 2017-03-28 ENCOUNTER — Telehealth: Payer: Self-pay | Admitting: *Deleted

## 2017-03-28 MED ORDER — VITAMIN D (ERGOCALCIFEROL) 1.25 MG (50000 UNIT) PO CAPS: 50000.0000 [IU] | ORAL_CAPSULE | ORAL | 0 refills | Status: DC

## 2017-03-28 NOTE — Telephone Encounter (Signed)
I have spoken with Darlene Mcconnell this afternoon, and per RAS, reviewed labs as below.  She verbalized understanding of same and is agreeable with taking rx. Vit. D, then otc Vit. D.  Rx. escribed to [facility] per her request/fim

## 2017-03-28 NOTE — Telephone Encounter (Signed)
-----   Message from Britt Bottom, MD sent at 03/26/2017  9:01 PM EDT ----- Please let her know that the vitamin D is very low. That's to 50,000 units weekly with 5 refills.

## 2017-05-27 IMAGING — MR MR HEAD WO/W CM
9 of 16 series · 27 of 48 positions shown · IV contrast (multihance)
Comparison: None.

CLINICAL DATA: Paresthesias in the RIGHT hand beginning several
months ago. Abnormal cervical MR.

EXAM:
MRI HEAD WITHOUT AND WITH CONTRAST
TECHNIQUE: Multiplanar, multiecho pulse sequences of the brain and surrounding
structures were obtained without and with intravenous contrast.
CONTRAST:  10mL MULTIHANCE GADOBENATE DIMEGLUMINE 529 MG/ML IV SOLN

[Series 4: FLAIR · sagittal · 1.2mm · 0.49mm/px · 9 of 232 slices shown (1 of 2)]
[im 1/232]
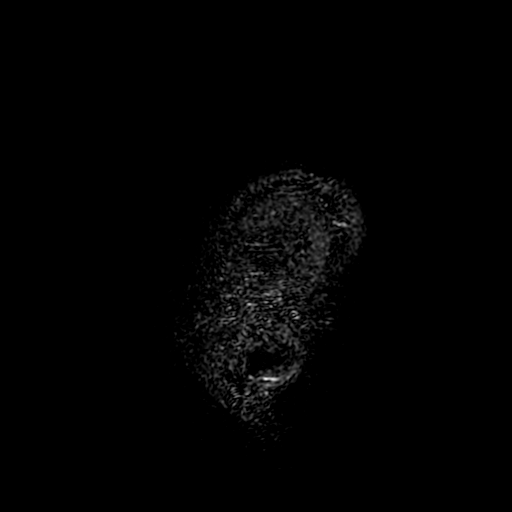
[im 43/232]
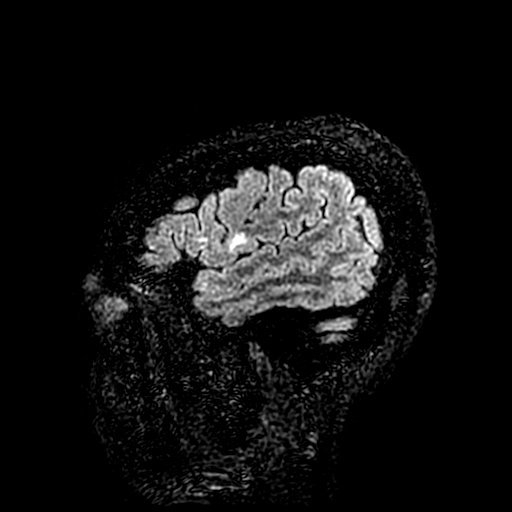
[im 64/232]
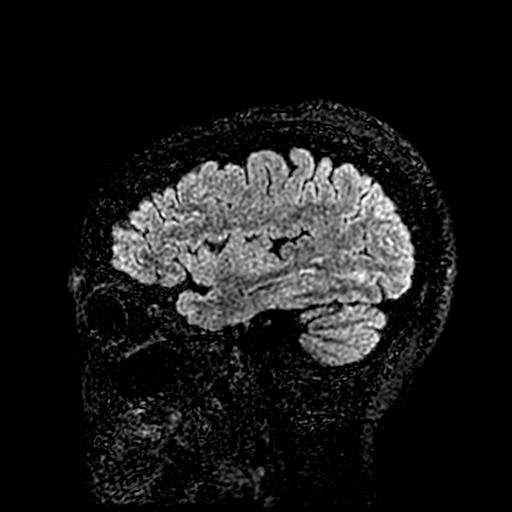
[im 106/232]
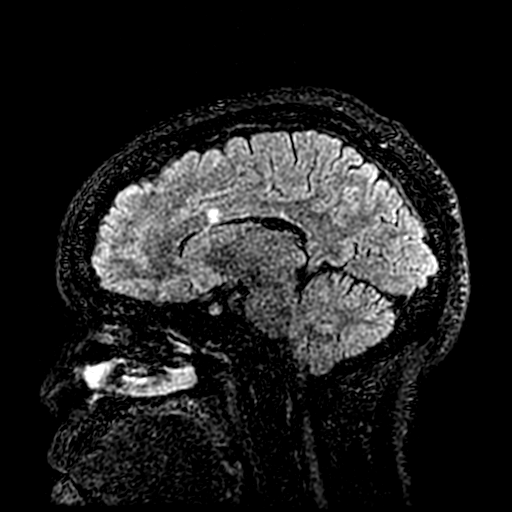
[im 127/232]
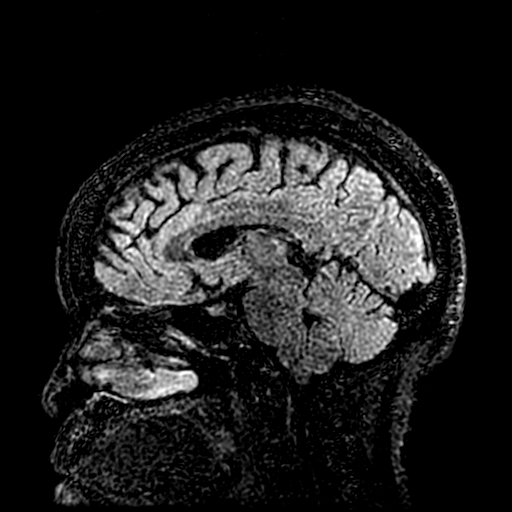
[im 169/232]
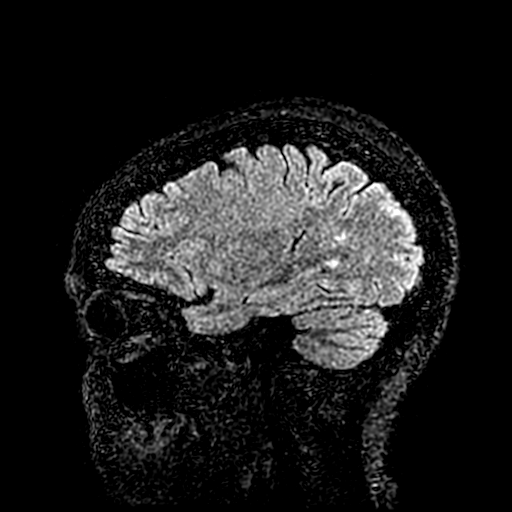
[im 190/232]
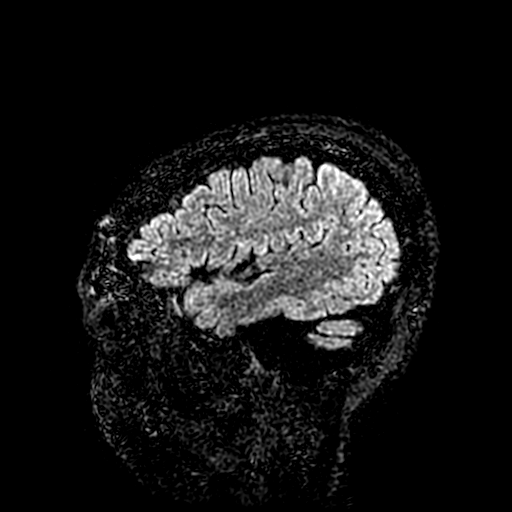
[im 211/232]
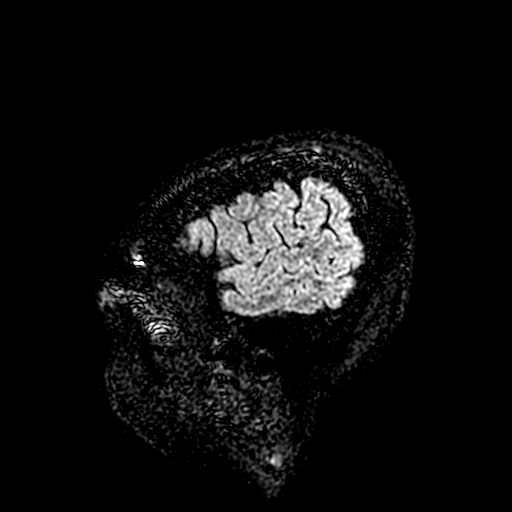
[im 232/232]
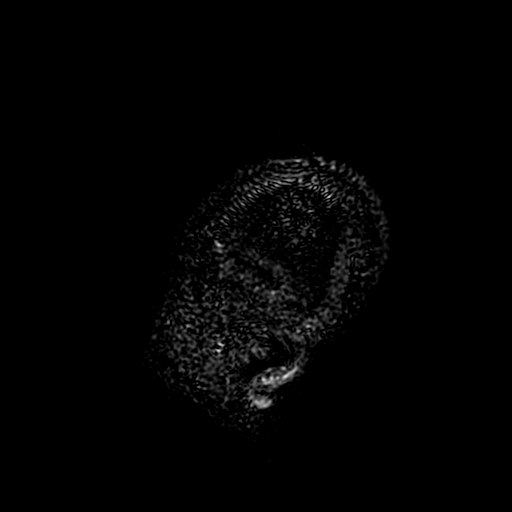

[Series 5: DWI · axial · 3.0mm · 1.09mm/px · z∈[-91,+47]mm · 5 of 94 slices shown (1 of 4)]
[im 1/94]
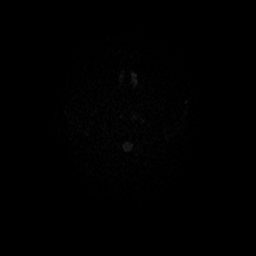
[im 24/94]
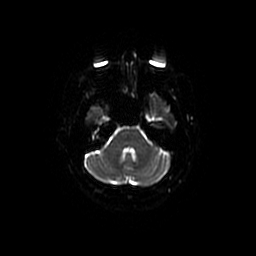
[im 47/94]
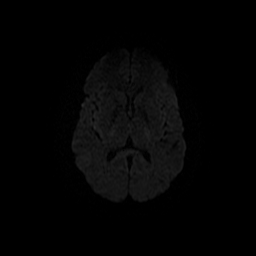
[im 70/94]
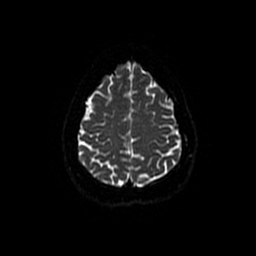
[im 94/94]
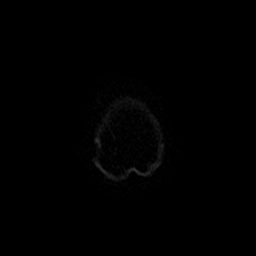

[Series 6: DWI · coronal · 5.0mm · 1.09mm/px · 4 of 74 slices shown (2 of 4)]
[im 1/74]
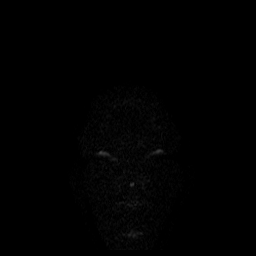
[im 25/74]
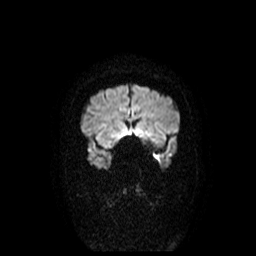
[im 49/74]
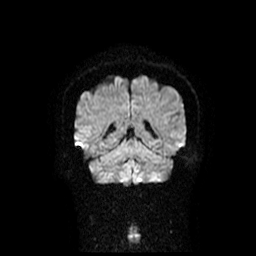
[im 74/74]
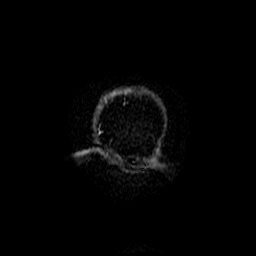

[Series 7: T2 · axial · 5.0mm · 0.43mm/px · 1 of 24 slices shown (1 of 2)]
[im 1/24]
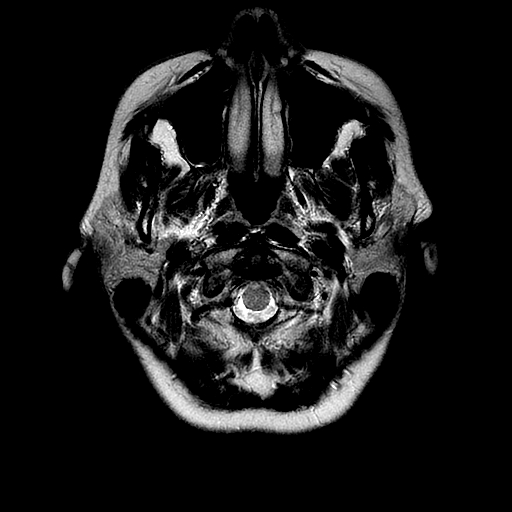

[Series 8: FLAIR · axial · 5.0mm · 0.43mm/px · 1 of 24 slices shown (2 of 2)]
[im 1/24]
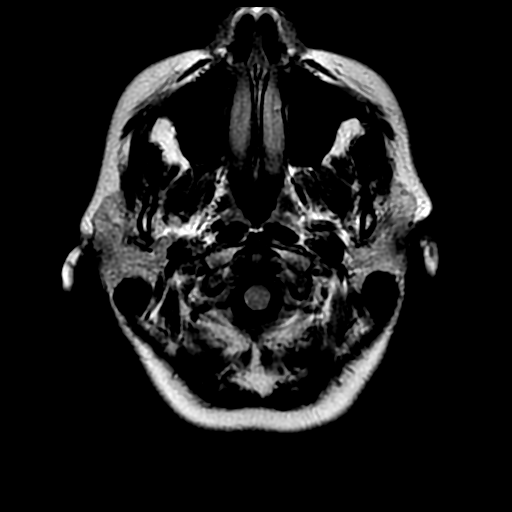

[Series 11: T2 · coronal · 5.0mm · 0.43mm/px · 1 of 31 slices shown (2 of 2)]
[im 1/31]
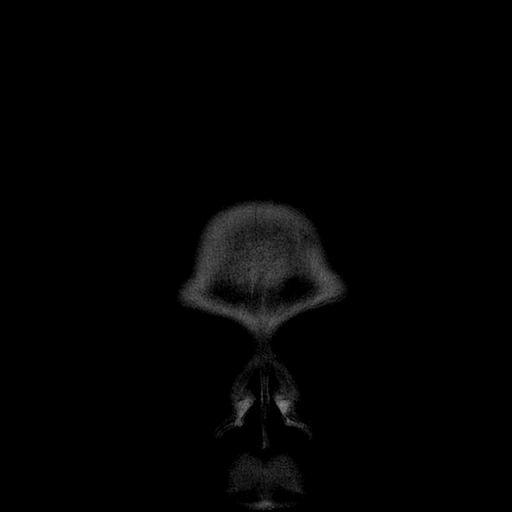

[Series 14: T1 post-contrast · coronal · 5.0mm · 0.43mm/px · 2 of 31 slices shown]
[im 1/31]
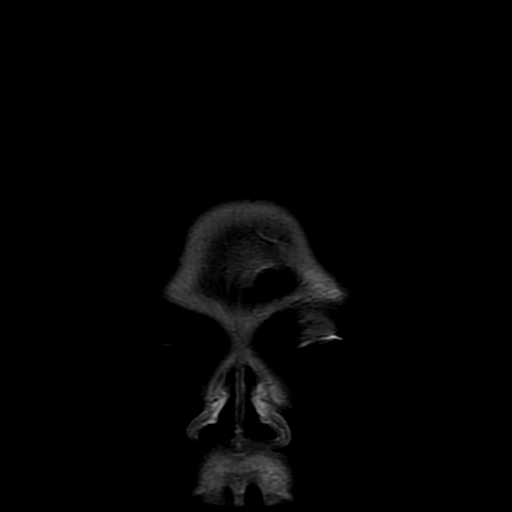
[im 31/31]
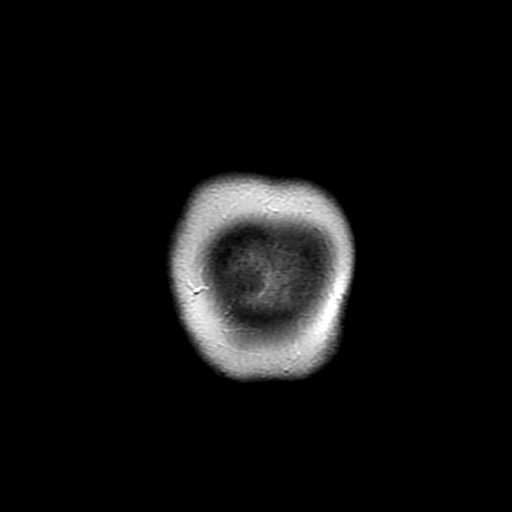

[Series 500: DWI · axial · 3.0mm · 1.09mm/px · z∈[-91,+47]mm · 2 of 47 slices shown (3 of 4)]
[im 1/47]
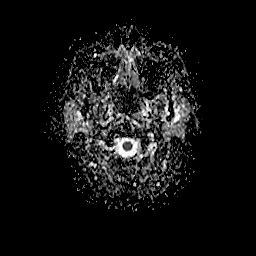
[im 47/47]
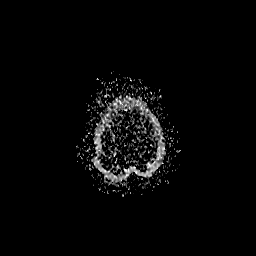

[Series 600: DWI · coronal · 5.0mm · 1.09mm/px · 2 of 37 slices shown (4 of 4)]
[im 1/37]
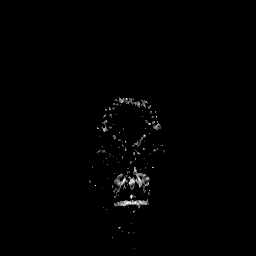
[im 37/37]
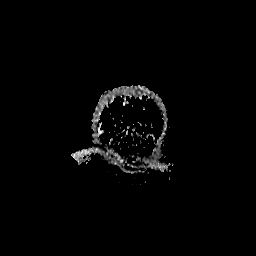

[27 of 48 positions shown; findings below may reference images not displayed]

FINDINGS: No acute stroke, acute hemorrhage, mass lesion, hydrocephalus, or
extra-axial fluid.

Possible acute subcentimeter RIGHT paramedian medullary white matter
lesion extending inferiorly as far as the olivary nuclei. This is
difficult to confirm on coronal DWI imaging, due to its small size.
I cannot definitely identify this area as abnormal on sagittal or
axial FLAIR imaging.

Mild facilitated diffusion surrounds a large periatrial lesion on
the RIGHT, affecting the posterior temporal occipital white matter.
Post infusion, there is enhancement of this periatrial lesion
consistent with an acute MS plaque. The acute plaque in the RIGHT
periatrial region displays some mild surrounding edema. The
enhancement is circumferential, and although there are areas of
greater and lesser thickness of contrast enhancement, there are no
areas of discontinuity. On axial imaging, this lesion measures 9 x
13 mm cross-section.

Widespread areas of chronic demyelination affect the periventricular
greater than subcortical white matter, both focal and confluent.
These are predominantly supratentorial. No corpus callosum atrophy.
The largest chronic ovoid plaques are seen in the LEFT greater than
RIGHT frontal regions and along the bodies of the lateral
ventricles.

Negative orbits. Negative sinuses and mastoids. Negative osseous
structures. Unremarkable pituitary and cerebellar tonsils. No areas
of vascular occlusion. Upper cervical lesion noted on April 2015 study
is difficult to identify on sagittal FLAIR.
IMPRESSION: 1 cm size definite acute MS plaque RIGHT periatrial region.

Widespread areas of chronic demyelination, predominantly
supratentorial.

Possible acute RIGHT paramedian medullary lesion; see discussion
above.

## 2017-08-26 ENCOUNTER — Ambulatory Visit: Payer: Medicaid Other | Admitting: Neurology

## 2017-08-30 ENCOUNTER — Encounter: Payer: Self-pay | Admitting: Neurology

## 2017-10-20 ENCOUNTER — Telehealth: Payer: Self-pay | Admitting: *Deleted

## 2017-10-20 NOTE — Telephone Encounter (Signed)
PA for Aubagio 14mg  tablets, #30/30 completed and faxed to [facility], fax# [phone].  Dx: RRMS (G35)  No tried and faileds.  Pt. has been stable on Aubagio since July 2016/fim

## 2017-10-26 ENCOUNTER — Ambulatory Visit (INDEPENDENT_AMBULATORY_CARE_PROVIDER_SITE_OTHER): Payer: Medicaid Other | Admitting: Neurology

## 2017-10-26 ENCOUNTER — Encounter: Payer: Self-pay | Admitting: Neurology

## 2017-10-26 VITALS — BP 180/89 | HR 68 | Resp 14 | Ht 63.0 in | Wt 134.5 lb

## 2017-10-26 DIAGNOSIS — M542 Cervicalgia: Secondary | ICD-10-CM | POA: Diagnosis not present

## 2017-10-26 DIAGNOSIS — R5383 Other fatigue: Secondary | ICD-10-CM

## 2017-10-26 DIAGNOSIS — N3281 Overactive bladder: Secondary | ICD-10-CM

## 2017-10-26 DIAGNOSIS — R269 Unspecified abnormalities of gait and mobility: Secondary | ICD-10-CM | POA: Diagnosis not present

## 2017-10-26 DIAGNOSIS — Z79899 Other long term (current) drug therapy: Secondary | ICD-10-CM | POA: Diagnosis not present

## 2017-10-26 DIAGNOSIS — G35 Multiple sclerosis: Secondary | ICD-10-CM

## 2017-10-26 DIAGNOSIS — G4721 Circadian rhythm sleep disorder, delayed sleep phase type: Secondary | ICD-10-CM

## 2017-10-26 MED ORDER — CYCLOBENZAPRINE HCL 5 MG PO TABS: ORAL_TABLET | ORAL | 5 refills | Status: DC

## 2017-10-26 NOTE — Telephone Encounter (Signed)
Called [facility] to check status of Aubagio PA. They state they never received PA, which was completed by fax on 10/20/17, and fax confirmation was received.  PA submitted by phone this time.  PA# [id number].  Ref# for call [id number]

## 2017-10-26 NOTE — Progress Notes (Signed)
[facility]  PATIENT: Darlene Mcconnell DOB: 13-May-1956  REFERRING DOCTOR OR PCP:  Tawanna Sat SOURCE: patient  _________________________________   HISTORICAL  CHIEF COMPLAINT:  Chief Complaint  Patient presents with  . Multiple Sclerosis    Sts. she continues to tolerate Aubagio well.  Denies new or worsening sx./fim    HISTORY OF PRESENT ILLNESS:  Darlene Mcconnell is a 61 year old woman who was diagnosed with MS in July 2016.    Update 10/26/2017:    She continues on Aubagio and she tolerates it well.    Gait is doing well with occasional balance issues but no falls.   She has no weakness but has mild right leg numbness .    Bladder function is doing better now.   Less urgency.  Vision is ok.     She is sleeping ok and she is able to fall asleep earlier than she used to. However, she gets only 4-5 hpurs sleep at night and then stays up    She often takes a nap in the afternoon as she feels less sleepy afterwards.    She denies depression but gets panicky at times and anxious off/on.     She is having right neck/shoulder pain .     Pain is throbbing in her muscles mostly.      From 03/25/2017  MS:   She is on Aubagio and tolerates it well.   No definite exacerbation.    She feels more tired.    Gait/strength/sensation: Her gait is unchanged with reduced balance.   She stumbles but no falls.    She is exercising less and denies any significant weakness.    Currently, she denies dysesthesia and the right sided numbness is minimal.   Shoulder pain:   She has right shoulder pain and feels weaker in her right arm.   Externally rotating hte shoulder is painful at times.    Bladder/bowel: She has urinary frequency but no recent incontinence.   She never started Myrbetriq.    Bowel function is fine.  Vision: She denies any blurriness with vision when she wears her glasses. She denies any diplopia.  Fatigue/sleep: She has fatigue and excessive daytime  sleepiness (falls asleep in church and when relaxing).  She falls asleep easily but wakes up a lot.   She falls asleep at 11 pm and wakes up at 2 am and may not fall back asleep.  Mood/cognition: She denies any depression or anxiety, but she gets aggravated easily. She feels her cognition is generally doing well. She tries to read daily.   She has two cousins with MS. One is bedridden and the other is doing well.   MS History:    She began to experience numbness in the right arm in early 2016. She noted numbness in the hand and higher up. A couple months later she began to have some pain in the shoulder and the neck area. On 05/07/2015, she had an MRI of the cervical spine that I personally reviewed. It shows a small focus of hyperintense signal adjacent to C2-C3 most evident on inversion recovery images but more subtly present on axial images as well. It was suggestive of MS and an MRI of the brain was performed on 06/09/2015. I personally reviewed those images. That shows multiple white matter foci in a pattern consistent with the diagnosis of MS. Specifically, foci of present in the periventricular and juxtacortical white matter. There is one enhancing focus in the right occipital  lobe. The presence of an enhancing lesion along with chronic periventricular and juxtacortical lesions and the presence of the spinal cord plaque allow [place] to make the diagnosis of multiple sclerosis with relative certainty.  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 This was felt to be symptomatic as she had eyelid drooping and she also had severe headaches though there was no definite evidence of rupture. Surgery was performed by Dr. Christella Noa.    REVIEW OF SYSTEMS: Constitutional: No fevers, chills, sweats, or change in appetite.   She notes some fatigue and mild sleepiness.  She has a delayed phase sleep disorder Eyes: No visual changes, double  vision, eye pain Ear, nose and throat: No hearing loss, ear pain, nasal congestion, sore throat Cardiovascular: No chest pain, palpitations Respiratory: No shortness of breath at rest or with exertion.   No wheezes GastrointestinaI: No nausea, vomiting, diarrhea, abdominal pain, fecal incontinence Genitourinary: No dysuria, urinary retention or frequency.  No nocturia. Musculoskeletal: No neck pain, back pain Integumentary: No rash, pruritus, skin lesions Neurological: as above Psychiatric: No depression at this time.  No anxiety Endocrine: No palpitations, diaphoresis, change in appetite, change in weigh or increased thirst Hematologic/Lymphatic: No anemia, purpura, petechiae. Allergic/Immunologic: No itchy/runny eyes, nasal congestion, recent allergic reactions, rashes  ALLERGIES: Allergies  Allergen Reactions  . Lisinopril     Swelling of the lip    HOME MEDICATIONS:  Current Outpatient Prescriptions:  .  AUBAGIO 14 MG TABS, TAKE 1 TABLET BY MOUTH DAILY, Disp: 84 tablet, Rfl: 3 .  naproxen (NAPROSYN) 500 MG tablet, Take 1 tablet (500 mg total) by mouth 2 (two) times daily with a meal., Disp: 30 tablet, Rfl: 0 .  cyclobenzaprine (FLEXERIL) 5 MG tablet, One or two po qHS, Disp: 60 tablet, Rfl: 5  PAST MEDICAL HISTORY: Past Medical History:  Diagnosis Date  . Amyotrophic lateral sclerosis/progressive muscular atrophy ([place])   . Hypertension   . 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 Migraine   . Multiple sclerosis ([place])   . Neuromuscular disorder ([place])     PAST SURGICAL HISTORY: Past Surgical History:  Procedure Laterality Date  . CRANIOTOMY  08/17/2012   Procedure: CRANIOTOMY INTRACRANIAL ANEURYSM FOR CAROTID;  Surgeon: Winfield Cunas, MD;  Location: [facility];  Service: Neurosurgery;  Laterality: Left;  Craniotomy for Aneurysm Clipping    FAMILY HISTORY: Family History  Problem Relation Age of Onset  . Dementia Unknown     SOCIAL HISTORY:  Social History    Social History  . Marital status: Legally Separated    Spouse name: N/A  . Number of children: N/A  . Years of education: N/A   Occupational History  . Not on file.   Social History Main Topics  . Smoking status: Current Every Day Smoker    Packs/day: 0.33    Years: 12.00    Types: Cigarettes  . Smokeless tobacco: Never Used  . Alcohol use 3.6 oz/week    6 Cans of beer per week     Comment: Beer (3-4) every night when she gets off work.   . Drug use: Yes    Types: Marijuana     Comment: former  . Sexual activity: Not on file   Other Topics Concern  . Not on file   Social History Narrative   Patient does not drink caffeine.   Patient is right handed.      PHYSICAL EXAM  Vitals:   10/26/17 1007  BP: (!) 180/89  Pulse: 68  Resp: 14  Weight: 134 lb 8 oz (61 kg)  Height: 5\' 3"  (1.6 m)    Body mass index is 23.83 kg/m.   General: The patient is well-developed and well-nourished and in no acute distress  Musculoskeletal: She is tender over the right trapezius, infraspinatus and rhomboid.   ROM of neck and shoulder are good.   No subAC bursa pain.    Neurologic Exam  Mental status: The patient is alert and oriented x 3 at the time of the examination. The patient has apparent normal recent and remote memory, with an apparently normal attention span and concentration ability. Speech is normal.  Cranial nerves: Extraocular movements are full. Facial strength and sensation is normal. Trapezius strength is strong. The tongue is midline, and the patient has symmetric elevation of the soft palate. No obvious hearing deficits are noted.  Motor: Muscle bulk is normal. Tone is normal. Strength is 5 / 5 in all 4 extremities.   Sensory: She had intact sensation to touch and vibration in the arms and legs..  Coordination: Cerebellar testing shows good finger-nose-finger bilaterally..  Gait and station: Station is normal. Gait is normal. Tandem gait is mildly  wide. Romberg is negative.   Reflexes: Deep tendon reflexes are symmetric but increased at the knees with spread to the other side.     DIAGNOSTIC DATA (LABS, IMAGING, TESTING) - I reviewed patient records, labs, notes, testing and imaging myself where available.  Lab Results  Component Value Date   WBC 10.5 03/25/2017   HGB 13.3 03/25/2017   HCT 40.7 03/25/2017   MCV 101 (H) 03/25/2017   PLT 268 03/25/2017      Component Value Date/Time   NA 140 03/23/2016 1443   K 4.1 03/23/2016 1443   CL 104 03/23/2016 1443   CO2 24 03/23/2016 1443   GLUCOSE 77 03/23/2016 1443   BUN 15 03/23/2016 1443   CREATININE 0.85 03/23/2016 1443   CALCIUM 9.5 03/23/2016 1443   PROT 7.0 03/25/2017 0952   ALBUMIN 4.3 03/25/2017 0952   AST 19 03/25/2017 0952   ALT 11 03/25/2017 0952   ALKPHOS 68 03/25/2017 0952   BILITOT 0.4 03/25/2017 0952   GFRNONAA 76 03/23/2016 1443   GFRAA 87 03/23/2016 1443   Lab Results  Component Value Date   CHOL 245 (H) 11/05/2009   HDL 98 11/05/2009   LDLCALC 131 (H) 11/05/2009   TRIG 80 11/05/2009   CHOLHDL 2.5 Ratio 11/05/2009   No results found for: HGBA1C No results found for: VITAMINB12 Lab Results  Component Value Date   TSH 1.110 03/25/2017       ASSESSMENT AND PLAN  Multiple sclerosis ([place]) - Plan: MR BRAIN W WO CONTRAST, Comprehensive metabolic panel, CBC with Differential/Platelet  High risk medication use  Neck pain  Gait disturbance  Overactive bladder  Other fatigue  Delayed sleep phase syndrome   1.  Continue Aubagio.     Check labs (CBC, LFT).    2.   TPI right trapezius, rhomboid and C6C7 paraspinal muscles with 80 mg Depo-medrol ([id number]  [id number]   Exp11/19) in [place] using sterile technique.    She tolerated the injection.  . 3.   Remain active and exercises as tolerated.  4.  She will return to see me in 6 months or sooner if problems.   Sallye Lunz A. Felecia Shelling, MD, PhD 76/16/0737, 1:06 PM Certified in Neurology,  Clinical Neurophysiology, Sleep Medicine,  Pain Medicine and Neuroimaging  [facility] [address], [place], [place] [address] [phone]

## 2017-10-27 ENCOUNTER — Telehealth: Payer: Self-pay

## 2017-10-27 LAB — CBC WITH DIFFERENTIAL/PLATELET
BASOS: 0 %
Basophils Absolute: 0 10*3/uL (ref 0.0–0.2)
EOS (ABSOLUTE): 0.1 10*3/uL (ref 0.0–0.4)
EOS: 1 %
HEMATOCRIT: 38.5 % (ref 34.0–46.6)
HEMOGLOBIN: 12.9 g/dL (ref 11.1–15.9)
Immature Grans (Abs): 0 10*3/uL (ref 0.0–0.1)
Immature Granulocytes: 0 %
LYMPHS ABS: 2.6 10*3/uL (ref 0.7–3.1)
Lymphs: 33 %
MCH: 33.3 pg — AB (ref 26.6–33.0)
MCHC: 33.5 g/dL (ref 31.5–35.7)
MCV: 100 fL — ABNORMAL HIGH (ref 79–97)
Monocytes Absolute: 0.7 10*3/uL (ref 0.1–0.9)
Monocytes: 8 %
NEUTROS ABS: 4.6 10*3/uL (ref 1.4–7.0)
Neutrophils: 58 %
Platelets: 297 10*3/uL (ref 150–379)
RBC: 3.87 x10E6/uL (ref 3.77–5.28)
RDW: 15 % (ref 12.3–15.4)
WBC: 8 10*3/uL (ref 3.4–10.8)

## 2017-10-27 LAB — COMPREHENSIVE METABOLIC PANEL
A/G RATIO: 2 (ref 1.2–2.2)
ALK PHOS: 63 IU/L (ref 39–117)
ALT: 11 IU/L (ref 0–32)
AST: 19 IU/L (ref 0–40)
Albumin: 4.4 g/dL (ref 3.5–5.5)
BUN/Creatinine Ratio: 12 (ref 9–23)
BUN: 11 mg/dL (ref 6–24)
Bilirubin Total: 0.6 mg/dL (ref 0.0–1.2)
CALCIUM: 9.6 mg/dL (ref 8.7–10.2)
CO2: 23 mmol/L (ref 20–29)
Chloride: 102 mmol/L (ref 96–106)
Creatinine, Ser: 0.95 mg/dL (ref 0.57–1.00)
GFR calc Af Amer: 76 mL/min/{1.73_m2} (ref >59)
GFR, EST NON AFRICAN AMERICAN: 66 mL/min/{1.73_m2} (ref >59)
GLOBULIN, TOTAL: 2.2 g/dL (ref 1.5–4.5)
Glucose: 85 mg/dL (ref 65–99)
POTASSIUM: 4.3 mmol/L (ref 3.5–5.2)
Sodium: 141 mmol/L (ref 134–144)
Total Protein: 6.6 g/dL (ref 6.0–8.5)

## 2017-10-27 NOTE — Telephone Encounter (Signed)
I spoke with patient and made her aware of results by phone.

## 2017-10-27 NOTE — Telephone Encounter (Signed)
-----   Message from Britt Bottom, MD sent at 10/27/2017  2:22 PM EDT ----- Please let the patient know that the lab work is fine.

## 2017-11-14 ENCOUNTER — Ambulatory Visit
Admission: RE | Admit: 2017-11-14 | Discharge: 2017-11-14 | Disposition: A | Discharge status: Home / Self Care | Payer: Medicaid Other | Source: Ambulatory Visit | Attending: Neurology | Admitting: Neurology

## 2017-11-14 DIAGNOSIS — G35 Multiple sclerosis: Secondary | ICD-10-CM

## 2017-11-14 MED ORDER — GADOBENATE DIMEGLUMINE 529 MG/ML IV SOLN
12.0000 mL | Freq: Once | INTRAVENOUS | Status: AC | PRN
  Administered 2017-11-14: 12 mL via INTRAVENOUS

## 2017-11-16 ENCOUNTER — Telehealth: Payer: Self-pay | Admitting: *Deleted

## 2017-11-16 NOTE — Telephone Encounter (Signed)
I have spoken with Darlene Mcconnell this afternoon and reviewed below MRI result.  She verbalized understanding of same/fim

## 2017-11-16 NOTE — Telephone Encounter (Signed)
-----   Message from Britt Bottom, MD sent at 11/16/2017  1:09 PM EST ----- Please let her know that the MRI did not show any changes.

## 2017-11-29 NOTE — Telephone Encounter (Signed)
Spoke with [employer] [employer].  Aubagio PA approved for dates 10/26/17 thru 10/21/18/fim

## 2017-12-08 ENCOUNTER — Other Ambulatory Visit: Payer: Self-pay | Admitting: Neurology

## 2017-12-08 ENCOUNTER — Telehealth: Payer: Self-pay | Admitting: Neurology

## 2017-12-08 MED ORDER — TERIFLUNOMIDE 14 MG PO TABS: 1.0000 | ORAL_TABLET | Freq: Every day | ORAL | 3 refills | Status: DC

## 2017-12-08 NOTE — Telephone Encounter (Signed)
Pt states that re: her   AUBAGIO 14 MG TABS   [facility], [place] [facility] [employer] [facility] [phone] (Phone) [phone] (Fax)   Is telling her that they need Dr Garth Bigness authorization before they will send out pt's medication.  Please process and pt is asking for a call as well

## 2017-12-08 NOTE — Addendum Note (Signed)
Addended by: France Ravens I on: 12/08/2017 02:08 PM   Modules accepted: Orders

## 2017-12-08 NOTE — Telephone Encounter (Signed)
Spoke with Barnetta Chapel and explained rx. has expired; [facility]/fim

## 2018-02-10 NOTE — Progress Notes (Signed)
[facility] Phone: [phone]   Date of Visit: 02/13/2018   HPI:  Patient presents today for a well woman exam.   Concerns today:  Dyspnea on Exertion:  -Noted on review of systems.  Patient reports of dyspnea on exertion for the past 6 months.  She notices this when she is cleaning or doing things around the house that she usually has no trouble doing.  Denies any lower extremity swelling or orthopnea or chest pain.  She describes it as it makes her "give out faster" when she does housework. -Denies any history of cardiac issues denies any family history of early heart disease  Itchy feet and thick toenails: -Reports of bilateral itchy feet which started about a year ago and also noticing her toenails are thick. -She has tried soaking them in water which has not really helped.  Seems like recently she has had worsening symptoms.  Periods: Postmenopausal.  Reports she reached menopause in her early 29s. Contraception: Not applicable Pelvic symptoms: Denies any vaginal bleeding, vaginal discharge, vaginal itching Sexual activity: no STD Screening: Would like to be screened for STDs today.  We discussed that we can do gonorrhea and chlamydia with her Pap smear. Pap smear status: Due for Pap, patient would like to make another appointment for this. Exercise: Reports she does not exercise as regularly as she used to.  She does walk sometimes for exercise. Diet: Reports that she does follow a well-balanced diet Smoking: Denies Alcohol: Denies Drugs: Denies Mood: PHQ 2 negative Dentist: Has not seen a dentist in a few years  ROS:  Review of Systems  Constitutional: Negative for chills, fever and weight loss.  HENT: Negative for ear pain, sinus pain, sore throat and tinnitus.   Eyes: Negative for blurred vision and double vision.  Respiratory: Positive for shortness of breath (dyspnea on exertion). Negative for cough.   Cardiovascular: Negative for chest  pain, palpitations, leg swelling and PND.  Gastrointestinal: Negative for abdominal pain, constipation, diarrhea, heartburn, nausea and vomiting.  Genitourinary: Negative for dysuria and frequency.  Musculoskeletal: Negative for joint pain and myalgias.  Skin: Positive for itching. Negative for rash.  Neurological: Negative for dizziness, sensory change, focal weakness and headaches.  Endo/Heme/Allergies: Negative for polydipsia. Does not bruise/bleed easily.  Psychiatric/Behavioral: Negative for depression and memory loss. The patient is not nervous/anxious.    [place]:  Cancers in family: Denies Father: 9s MI No FH of osteoporosis   Past medical history: Multiple sclerosis, migraine, hypertension   PHYSICAL EXAM: BP 140/80   Pulse 67   Temp 98 F (36.7 C) (Oral)   Wt 129 lb (58.5 kg)   SpO2 99%   BMI 22.85 kg/m  Gen: NAD, pleasant, cooperative HEENT: NCAT, PERRL, no palpable thyromegaly or anterior cervical lymphadenopathy Heart: RRR, no murmurs Lungs: CTAB, NWOB Abdomen: soft, nontender to palpation Neuro: grossly nonfocal, speech normal Lower extremities: No edema or calf pain bilaterally.  Bilateral feet: Thickened dark toenails mainly of the great toes bilaterally.  No scaling or rash of the feet.  Good DP pulses bilaterally and good capillary refill.  ASSESSMENT/PLAN:  # Health maintenance:  -STD screening: HIV, RPR, Gc/chlamydia (on pap) -pap smear: Patient declined Pap smear today.  She would like to make a follow-up appointment for this. -mammogram: ordered, discussed need to call to make appointment  -lipid screening: not fasting today, future order  -immunizations: Tdap, Flu -Hepatitis C antibody  - GI referral to colon cancer screening   Dyspnea on  exertion: No signs of fluid overload on exam.  Patient does not have any other symptoms of heart failure either.  Lung exam is clear and no lower extremity swelling.  Denies any chest pain.  Will obtain labs to  further evaluate his CBC and CMP.  If labs are unremarkable would consider echo in the near future.  Onychomycosis of toenails: Discussed the risks and benefits of oral therapy.  Patient would like to proceed with cryotherapy.  Terbinafine 250 mg daily prescribed.  Will likely need 12-week course.  CMP is ordered to obtain baseline LFTs.  Smiley Houseman, MD PGY [place]

## 2018-02-13 ENCOUNTER — Other Ambulatory Visit: Payer: Self-pay

## 2018-02-13 ENCOUNTER — Ambulatory Visit (INDEPENDENT_AMBULATORY_CARE_PROVIDER_SITE_OTHER): Payer: Medicaid Other | Admitting: Internal Medicine

## 2018-02-13 ENCOUNTER — Encounter: Payer: Self-pay | Admitting: Internal Medicine

## 2018-02-13 VITALS — BP 140/80 | HR 67 | Temp 98.0°F | Wt 129.0 lb

## 2018-02-13 DIAGNOSIS — Z1159 Encounter for screening for other viral diseases: Secondary | ICD-10-CM

## 2018-02-13 DIAGNOSIS — E559 Vitamin D deficiency, unspecified: Secondary | ICD-10-CM | POA: Diagnosis not present

## 2018-02-13 DIAGNOSIS — Z79899 Other long term (current) drug therapy: Secondary | ICD-10-CM

## 2018-02-13 DIAGNOSIS — Z113 Encounter for screening for infections with a predominantly sexual mode of transmission: Secondary | ICD-10-CM

## 2018-02-13 DIAGNOSIS — Z1211 Encounter for screening for malignant neoplasm of colon: Secondary | ICD-10-CM

## 2018-02-13 DIAGNOSIS — Z23 Encounter for immunization: Secondary | ICD-10-CM

## 2018-02-13 DIAGNOSIS — R0609 Other forms of dyspnea: Secondary | ICD-10-CM | POA: Diagnosis not present

## 2018-02-13 DIAGNOSIS — Z0001 Encounter for general adult medical examination with abnormal findings: Secondary | ICD-10-CM | POA: Diagnosis not present

## 2018-02-13 DIAGNOSIS — Z1239 Encounter for other screening for malignant neoplasm of breast: Secondary | ICD-10-CM

## 2018-02-13 DIAGNOSIS — B351 Tinea unguium: Secondary | ICD-10-CM

## 2018-02-13 DIAGNOSIS — Z114 Encounter for screening for human immunodeficiency virus [HIV]: Secondary | ICD-10-CM

## 2018-02-13 DIAGNOSIS — E785 Hyperlipidemia, unspecified: Secondary | ICD-10-CM

## 2018-02-13 DIAGNOSIS — Z0000 Encounter for general adult medical examination without abnormal findings: Secondary | ICD-10-CM

## 2018-02-13 MED ORDER — TERBINAFINE HCL 250 MG PO TABS: 250.0000 mg | ORAL_TABLET | Freq: Every day | ORAL | 0 refills | Status: DC

## 2018-02-13 NOTE — Patient Instructions (Addendum)
We will get some blood work today  Please follow up so we can do your pap smear and talk more about your shortness of breath with walking.  For your fungal infection of the toes, please take Terbinafine daily for 12 weeks.  I made a referral to the GI doctor for your colon cancer screening Make an appointment for your mammogram

## 2018-02-14 ENCOUNTER — Encounter: Payer: Self-pay | Admitting: Internal Medicine

## 2018-02-14 ENCOUNTER — Telehealth: Payer: Self-pay | Admitting: Internal Medicine

## 2018-02-14 LAB — CMP14+EGFR
A/G RATIO: 1.8 (ref 1.2–2.2)
ALT: 11 IU/L (ref 0–32)
AST: 16 IU/L (ref 0–40)
Albumin: 4.6 g/dL (ref 3.5–5.5)
Alkaline Phosphatase: 78 IU/L (ref 39–117)
BUN/Creatinine Ratio: 19 (ref 9–23)
BUN: 18 mg/dL (ref 6–24)
Bilirubin Total: 0.4 mg/dL (ref 0.0–1.2)
CALCIUM: 9.8 mg/dL (ref 8.7–10.2)
CHLORIDE: 106 mmol/L (ref 96–106)
CO2: 20 mmol/L (ref 20–29)
Creatinine, Ser: 0.94 mg/dL (ref 0.57–1.00)
GFR calc Af Amer: 77 mL/min/{1.73_m2} (ref >59)
GFR, EST NON AFRICAN AMERICAN: 67 mL/min/{1.73_m2} (ref >59)
GLUCOSE: 71 mg/dL (ref 65–99)
Globulin, Total: 2.6 g/dL (ref 1.5–4.5)
POTASSIUM: 4 mmol/L (ref 3.5–5.2)
Sodium: 146 mmol/L — ABNORMAL HIGH (ref 134–144)
Total Protein: 7.2 g/dL (ref 6.0–8.5)

## 2018-02-14 LAB — HEPATITIS C ANTIBODY

## 2018-02-14 LAB — RPR: RPR: NONREACTIVE

## 2018-02-14 LAB — HIV ANTIBODY (ROUTINE TESTING W REFLEX): HIV SCREEN 4TH GENERATION: NONREACTIVE

## 2018-02-14 LAB — CBC
HEMOGLOBIN: 13.5 g/dL (ref 11.1–15.9)
Hematocrit: 40.2 % (ref 34.0–46.6)
MCH: 34 pg — AB (ref 26.6–33.0)
MCHC: 33.6 g/dL (ref 31.5–35.7)
MCV: 101 fL — ABNORMAL HIGH (ref 79–97)
Platelets: 238 10*3/uL (ref 150–379)
RBC: 3.97 x10E6/uL (ref 3.77–5.28)
RDW: 14.7 % (ref 12.3–15.4)
WBC: 7.6 10*3/uL (ref 3.4–10.8)

## 2018-02-14 LAB — VITAMIN D 25 HYDROXY (VIT D DEFICIENCY, FRACTURES): VIT D 25 HYDROXY: 10.2 ng/mL — AB (ref 30.0–100.0)

## 2018-02-14 NOTE — Telephone Encounter (Signed)
Called but went to voicemail. Left message to call back.   Her blood work is overall normal except for vitamin D level which is low.  Due to her vitamin D deficiency I would like her to start vitamin D 1000 international units daily.  We can repeat her levels in about 3 months.  I will not send the vitamin D tablets until I touch base with her.

## 2018-02-15 ENCOUNTER — Other Ambulatory Visit: Payer: Self-pay | Admitting: Internal Medicine

## 2018-02-15 DIAGNOSIS — Z1231 Encounter for screening mammogram for malignant neoplasm of breast: Secondary | ICD-10-CM

## 2018-02-15 NOTE — Telephone Encounter (Signed)
Pt returned drs phone call

## 2018-02-15 NOTE — Telephone Encounter (Signed)
Pt contacted and informed of vitamin D level and the need for prescription. Please send in.

## 2018-02-16 MED ORDER — VITAMIN D3 25 MCG (1000 UT) PO CAPS: ORAL_CAPSULE | ORAL | 1 refills | Status: DC

## 2018-02-16 NOTE — Addendum Note (Signed)
Addended by: Smiley Houseman on: 02/16/2018 08:51 AM   Modules accepted: Orders

## 2018-02-16 NOTE — Telephone Encounter (Signed)
Rx sent 

## 2018-03-09 ENCOUNTER — Ambulatory Visit
Admission: RE | Admit: 2018-03-09 | Discharge: 2018-03-09 | Disposition: A | Discharge status: Home / Self Care | Payer: Medicaid Other | Source: Ambulatory Visit | Attending: Family Medicine | Admitting: Family Medicine

## 2018-03-09 DIAGNOSIS — Z1231 Encounter for screening mammogram for malignant neoplasm of breast: Secondary | ICD-10-CM

## 2018-03-15 ENCOUNTER — Encounter: Payer: Self-pay | Admitting: Internal Medicine

## 2018-03-15 ENCOUNTER — Ambulatory Visit (INDEPENDENT_AMBULATORY_CARE_PROVIDER_SITE_OTHER): Payer: Medicaid Other | Admitting: Internal Medicine

## 2018-03-15 ENCOUNTER — Other Ambulatory Visit: Payer: Self-pay

## 2018-03-15 DIAGNOSIS — M546 Pain in thoracic spine: Secondary | ICD-10-CM | POA: Diagnosis present

## 2018-03-15 DIAGNOSIS — M549 Dorsalgia, unspecified: Secondary | ICD-10-CM | POA: Insufficient documentation

## 2018-03-15 NOTE — Progress Notes (Signed)
   Subjective:   Patient: Darlene Mcconnell       Birthdate: [birth year]       MRN: [id number]      HPI  Darlene Mcconnell is a 62 y.o. female presenting for same day appt for back pain.   Back pain Began yesterday after trying to close the tailgate of a truck. Felt pain on L under scapula. Took ibuprofen, but had difficulty sleeping last night due to pain. Worse when she woke up this morning. Had difficulty getting out of bed due to pain. Describes as a throbbing located in L upper back. Denies weakness, numbness, or tingling of LUE. Has not used ice or heat. Is prescribed Flexeril though has been out of that so did not take any. Did not take any ibuprofen today, only once yesterday immediately after onset of pain.   Smoking status reviewed. Patient is current every day smoker.   Review of Systems See HPI.     Objective:  Physical Exam  Constitutional: She is oriented to person, place, and time and well-developed, well-nourished, and in no distress.  HENT:  Head: Normocephalic and atraumatic.  Pulmonary/Chest: Effort normal. No respiratory distress.  Musculoskeletal:  TTP of L trapezius extending to below L scapula. 5/5 strength upper extremities bilaterally. Able to walk, sit, and stand unassisted without difficulty.   Neurological: She is alert and oriented to person, place, and time.  Skin: Skin is warm and dry.  Psychiatric: Affect and judgment normal.   Assessment & Plan:  Back pain Began after attempting to lift heavy tailgate yesterday. Given mechanism of injury as well as reproducibility of pain on exam, most likely MSK etiology, possibly trapezius strain given location of tenderness. No reported numbness or weakness of upper extremity, and no weakness of upper extremity on exam. Patient already has prescription for Flexeril, and is going to pick up refill today. Will begin trial of scheduled ibuprofen 1-2 tabs q8 for next 5 days with Flexeril PRN. Also encouraged ice and  heat as needed as well as staying mobile. Discussed that MSK injuries can take a while to heal, and she will likely have at least some pain for the next couple weeks, if not longer.    Adin Hector, MD, MPH PGY-3 [facility] Pager [phone]

## 2018-03-15 NOTE — Patient Instructions (Addendum)
It was nice meeting you today Ms. Mustapha!  Please begin taking 1-2 ibuprofen tablets every 8 hours for the next 5 days to help with pain and inflammation. You can take the Flexeril up to every 8 hours as well if you still have pain despite ibuprofen. You can use ice or heat to help with pain as well, whichever feels better. It is also important to stay as active as possible.   If you have any questions or concerns, please feel free to call the clinic.   Be well,  Dr. Avon Gully

## 2018-03-15 NOTE — Assessment & Plan Note (Signed)
Began after attempting to lift heavy tailgate yesterday. Given mechanism of injury as well as reproducibility of pain on exam, most likely MSK etiology, possibly trapezius strain given location of tenderness. No reported numbness or weakness of upper extremity, and no weakness of upper extremity on exam. Patient already has prescription for Flexeril, and is going to pick up refill today. Will begin trial of scheduled ibuprofen 1-2 tabs q8 for next 5 days with Flexeril PRN. Also encouraged ice and heat as needed as well as staying mobile. Discussed that MSK injuries can take a while to heal, and she will likely have at least some pain for the next couple weeks, if not longer.

## 2018-04-13 ENCOUNTER — Encounter: Payer: Self-pay | Admitting: Internal Medicine

## 2018-05-25 ENCOUNTER — Other Ambulatory Visit: Payer: Self-pay | Admitting: Internal Medicine

## 2018-05-25 DIAGNOSIS — B351 Tinea unguium: Secondary | ICD-10-CM

## 2018-06-13 ENCOUNTER — Ambulatory Visit (AMBULATORY_SURGERY_CENTER): Payer: Self-pay | Admitting: *Deleted

## 2018-06-13 ENCOUNTER — Other Ambulatory Visit: Payer: Self-pay

## 2018-06-13 VITALS — Ht 62.0 in | Wt 130.4 lb

## 2018-06-13 DIAGNOSIS — Z1211 Encounter for screening for malignant neoplasm of colon: Secondary | ICD-10-CM

## 2018-06-13 MED ORDER — NA SULFATE-K SULFATE-MG SULF 17.5-3.13-1.6 GM/177ML PO SOLN: 1.0000 | Freq: Once | ORAL | 0 refills | Status: AC

## 2018-06-13 NOTE — Progress Notes (Signed)
No egg or soy allergy known to patient  No issues with past sedation with any surgeries  or procedures, no intubation problems  No diet pills per patient No home 02 use per patient  No blood thinners per patient  Pt denies issues with constipation  No A fib or A flutter  EMMI video sent to pt's e mail  

## 2018-06-21 ENCOUNTER — Ambulatory Visit (AMBULATORY_SURGERY_CENTER): Payer: Medicaid Other | Admitting: Gastroenterology

## 2018-06-21 ENCOUNTER — Other Ambulatory Visit: Payer: Self-pay

## 2018-06-21 ENCOUNTER — Encounter: Payer: Self-pay | Admitting: Gastroenterology

## 2018-06-21 VITALS — BP 164/73 | HR 56 | Temp 97.1°F | Resp 18 | Ht 62.0 in | Wt 130.0 lb

## 2018-06-21 DIAGNOSIS — K635 Polyp of colon: Secondary | ICD-10-CM | POA: Diagnosis not present

## 2018-06-21 DIAGNOSIS — D122 Benign neoplasm of ascending colon: Secondary | ICD-10-CM

## 2018-06-21 DIAGNOSIS — Z1211 Encounter for screening for malignant neoplasm of colon: Secondary | ICD-10-CM

## 2018-06-21 DIAGNOSIS — D120 Benign neoplasm of cecum: Secondary | ICD-10-CM | POA: Diagnosis not present

## 2018-06-21 DIAGNOSIS — D127 Benign neoplasm of rectosigmoid junction: Secondary | ICD-10-CM

## 2018-06-21 MED ORDER — SODIUM CHLORIDE 0.9 % IV SOLN: 500.0000 mL | Freq: Once | INTRAVENOUS | Status: DC

## 2018-06-21 NOTE — Progress Notes (Signed)
Called to room to assist during endoscopic procedure.  Patient ID and intended procedure confirmed with present staff. Received instructions for my participation in the procedure from the performing physician.  

## 2018-06-21 NOTE — Op Note (Addendum)
[facility] Patient Name: Darlene Mcconnell Procedure Date: 06/21/2018 10:30 AM MRN: [id number] Endoscopist: Mauri Pole , MD Age: 62 Referring MD:  Date of Birth: 25-May-1956 Gender: Female Account #: [id number] Procedure:                Colonoscopy Indications:              Screening for colorectal malignant neoplasm Medicines:                Monitored Anesthesia Care Procedure:                Pre-Anesthesia Assessment:                           - Prior to the procedure, a History and Physical                            was performed, and patient medications and                            allergies were reviewed. The patient's tolerance of                            previous anesthesia was also reviewed. The risks                            and benefits of the procedure and the sedation                            options and risks were discussed with the patient.                            All questions were answered, and informed consent                            was obtained. Prior Anticoagulants: The patient has                            taken no previous anticoagulant or antiplatelet                            agents. ASA Grade Assessment: II - A patient with                            mild systemic disease. After reviewing the risks                            and benefits, the patient was deemed in                            satisfactory condition to undergo the procedure.                           After obtaining informed consent, the colonoscope  was passed under direct vision. Throughout the                            procedure, the patient's blood pressure, pulse, and                            oxygen saturations were monitored continuously. The                            Model PCF-[id number]) scope was introduced                            through the anus and advanced to the the cecum,   identified by appendiceal orifice and ileocecal                            valve. The colonoscopy was performed without                            difficulty. The patient tolerated the procedure                            well. The quality of the bowel preparation was                            excellent. The ileocecal valve, appendiceal                            orifice, and rectum were photographed. Scope In: 10:43:15 AM Scope Out: 40:81:44 AM Scope Withdrawal Time: 0 hours 17 minutes 36 seconds  Total Procedure Duration: 0 hours 23 minutes 39 seconds  Findings:                 The perianal and digital rectal examinations were                            normal.                           A 5 mm polyp was found in the cecum. The polyp was                            sessile. The polyp was removed with a cold snare.                            Resection and retrieval were complete.                           A 12 mm polyp was found in the ascending colon. The                            polyp was semi-pedunculated. The polyp was removed  with a hot snare. Resection and retrieval were                            complete.                           Four sessile polyps were found in the recto-sigmoid                            colon, ascending colon and cecum. The polyps were 1                            to 2 mm in size. These polyps were removed with a                            cold biopsy forceps. Resection and retrieval were                            complete.                           Multiple small-mouthed diverticula were found in                            the sigmoid colon, descending colon, transverse                            colon and ascending colon. Complications:            No immediate complications. Estimated Blood Loss:     Estimated blood loss was minimal. Impression:               - One 5 mm polyp in the cecum, removed with a cold                             snare. Resected and retrieved.                           - One 12 mm polyp in the ascending colon, removed                            with a hot snare. Resected and retrieved.                           - Four 1 to 2 mm polyps at the recto-sigmoid colon,                            in the ascending colon and in the cecum, removed                            with a cold biopsy forceps. Resected and retrieved.                           - Moderate diverticulosis in the sigmoid colon, in  the descending colon, in the transverse colon and                            in the ascending colon. Recommendation:           - Patient has a contact number available for                            emergencies. The signs and symptoms of potential                            delayed complications were discussed with the                            patient. Return to normal activities tomorrow.                            Written discharge instructions were provided to the                            patient.                           - Resume previous diet.                           - Continue present medications.                           - Await pathology results.                           - Repeat colonoscopy in 3 - 5 years for                            surveillance based on pathology results. Mauri Pole, MD 06/21/2018 11:21:18 AM This report has been signed electronically.

## 2018-06-21 NOTE — Patient Instructions (Signed)
YOU HAD AN ENDOSCOPIC PROCEDURE TODAY AT [facility]:   Refer to the procedure report that was given to you for any specific questions about what was found during the examination.  If the procedure report does not answer your questions, please call your gastroenterologist to clarify.  If you requested that your care partner not be given the details of your procedure findings, then the procedure report has been included in a sealed envelope for you to review at your convenience later.  YOU SHOULD EXPECT: Some feelings of bloating in the abdomen. Passage of more gas than usual.  Walking can help get rid of the air that was put into your GI tract during the procedure and reduce the bloating. If you had a lower endoscopy (such as a colonoscopy or flexible sigmoidoscopy) you may notice spotting of blood in your stool or on the toilet paper. If you underwent a bowel prep for your procedure, you may not have a normal bowel movement for a few days.  Please Note:  You might notice some irritation and congestion in your nose or some drainage.  This is from the oxygen used during your procedure.  There is no need for concern and it should clear up in a day or so.  SYMPTOMS TO REPORT IMMEDIATELY:   Following lower endoscopy (colonoscopy or flexible sigmoidoscopy):  Excessive amounts of blood in the stool  Significant tenderness or worsening of abdominal pains  Swelling of the abdomen that is new, acute  Fever of 100F or higher  For urgent or emergent issues, a gastroenterologist can be reached at any hour by calling [phone].   DIET:  We do recommend a small meal at first, but then you may proceed to your regular diet.  Drink plenty of fluids but you should avoid alcoholic beverages for 24 hours.  ACTIVITY:  You should plan to take it easy for the rest of today and you should NOT DRIVE or use heavy machinery until tomorrow (because of the sedation medicines used during the test).     FOLLOW UP: Our staff will call the number listed on your records the next business day following your procedure to check on you and address any questions or concerns that you may have regarding the information given to you following your procedure. If we do not reach you, we will leave a message.  However, if you are feeling well and you are not experiencing any problems, there is no need to return our call.  We will assume that you have returned to your regular daily activities without incident.  If any biopsies were taken you will be contacted by phone or by letter within the next 1-3 weeks.  Please call us at [phone] if you have not heard about the biopsies in 3 weeks.   Polyps (handout given) Diverticulosis (handout given)  SIGNATURES/CONFIDENTIALITY: You and/or your care partner have signed paperwork which will be entered into your electronic medical record.  These signatures attest to the fact that that the information above on your After Visit Summary has been reviewed and is understood.  Full responsibility of the confidentiality of this discharge information lies with you and/or your care-partner.

## 2018-06-21 NOTE — Progress Notes (Signed)
Report to PACU, RN, vss, BBS= Clear.  

## 2018-06-21 NOTE — Progress Notes (Signed)
Pt's states no medical or surgical changes since previsit or office visit. 

## 2018-06-22 ENCOUNTER — Telehealth: Payer: Self-pay

## 2018-06-22 NOTE — Telephone Encounter (Signed)
  Follow up Call-  Call back number 06/21/2018  Post procedure Call Back phone  # [phone]  Permission to leave phone message Yes  Some recent data might be hidden     Patient questions:  Do you have a fever, pain , or abdominal swelling? No. Pain Score  0 *  Have you tolerated food without any problems? Yes.    Have you been able to return to your normal activities? Yes.    Do you have any questions about your discharge instructions: Diet   No. Medications  No. Follow up visit  No.  Do you have questions or concerns about your Care? No.  Actions: * If pain score is 4 or above: No action needed, pain <4.

## 2018-06-27 ENCOUNTER — Encounter: Payer: Self-pay | Admitting: Gastroenterology

## 2018-06-29 ENCOUNTER — Other Ambulatory Visit: Payer: Self-pay | Admitting: Internal Medicine

## 2018-07-29 ENCOUNTER — Other Ambulatory Visit: Payer: Self-pay | Admitting: Internal Medicine

## 2018-07-29 DIAGNOSIS — B351 Tinea unguium: Secondary | ICD-10-CM

## 2018-10-02 ENCOUNTER — Telehealth: Payer: Self-pay | Admitting: Neurology

## 2018-10-02 NOTE — Telephone Encounter (Signed)
Pt. not seen since Oct. 2018. Did not come in for her 6 mo. f/u and has no pending appt.  Spoke with pt. and gave appt. 10/05/18, arrival time of 0830 for a 9am appt/fim

## 2018-10-02 NOTE — Telephone Encounter (Signed)
Colletta [place] with [facility] requesting refill for Teriflunomide (AUBAGIO) 14 MG TABS.

## 2018-10-03 NOTE — Telephone Encounter (Addendum)
Christ[facility] [phone] called to advise the insurance is requesting a new script when it is sent in. She is aware the pt has an appt on 10/10 and it won't be sent before she is seen.   FYI

## 2018-10-03 NOTE — Telephone Encounter (Signed)
Noted/fim 

## 2018-10-05 ENCOUNTER — Other Ambulatory Visit: Payer: Self-pay

## 2018-10-05 ENCOUNTER — Ambulatory Visit: Payer: Medicaid Other | Admitting: Neurology

## 2018-10-05 ENCOUNTER — Encounter: Payer: Self-pay | Admitting: Neurology

## 2018-10-05 VITALS — BP 187/89 | HR 68 | Resp 14 | Ht 62.0 in | Wt 128.0 lb

## 2018-10-05 DIAGNOSIS — G35 Multiple sclerosis: Secondary | ICD-10-CM

## 2018-10-05 DIAGNOSIS — G4700 Insomnia, unspecified: Secondary | ICD-10-CM | POA: Diagnosis not present

## 2018-10-05 DIAGNOSIS — M25511 Pain in right shoulder: Secondary | ICD-10-CM | POA: Diagnosis not present

## 2018-10-05 DIAGNOSIS — M542 Cervicalgia: Secondary | ICD-10-CM

## 2018-10-05 DIAGNOSIS — R269 Unspecified abnormalities of gait and mobility: Secondary | ICD-10-CM | POA: Diagnosis not present

## 2018-10-05 MED ORDER — GABAPENTIN 300 MG PO CAPS: ORAL_CAPSULE | ORAL | 5 refills | Status: DC

## 2018-10-05 MED ORDER — TERIFLUNOMIDE 14 MG PO TABS: 1.0000 | ORAL_TABLET | Freq: Every day | ORAL | 3 refills | Status: DC

## 2018-10-05 MED ORDER — ETODOLAC 400 MG PO TABS: 400.0000 mg | ORAL_TABLET | Freq: Two times a day (BID) | ORAL | 5 refills | Status: DC

## 2018-10-05 NOTE — Patient Instructions (Signed)
Etodolac twice a day as needed for pain  Gabapentin 1 or 2 at night for better sleep

## 2018-10-05 NOTE — Progress Notes (Signed)
[facility]  PATIENT: Darlene Mcconnell DOB: 1956-01-23  REFERRING DOCTOR OR PCP:  Tawanna Sat SOURCE: patient  _________________________________   HISTORICAL  CHIEF COMPLAINT:  Chief Complaint  Patient presents with  . Multiple Sclerosis    Sts. she continues to tolerate Aubagio well, but has been out of it for 3 days now. Sts. Flexeril does not help pain or stiffness./fim    HISTORY OF PRESENT ILLNESS:  Darlene Mcconnell is a 62 y.o. woman who was diagnosed with MS in July 2016.    Update 10/05/2018: She is on Aubagio and she tolerates it well.  She has not had any Aubagio x 3 days  She has not had any exacerbation.  Gait is ok with some stumbles but no falls.  Mild bladder urgency.      She also reports a lot of difficulty with sleep.   She falls asleep but wakes up and then can;t fall back asleep.   She reports more fatigue the next day.   Mood is fine.    Cognition is ok  She is having more pain in the right arm but some pain both sides and in neck also.   The neck is stiff.    Flexeril bid has not helped.     She was having more shoulder and neck pain last time and the TPI helped a lot.   The shoulder pain has returned.      Update 10/26/2017:    She continues on Aubagio and she tolerates it well.    Gait is doing well with occasional balance issues but no falls.   She has no weakness but has mild right leg numbness .    Bladder function is doing better now.   Less urgency.  Vision is ok.     She is sleeping ok and she is able to fall asleep earlier than she used to. However, she gets only 4-5 hpurs sleep at night and then stays up    She often takes a nap in the afternoon as she feels less sleepy afterwards.    She denies depression but gets panicky at times and anxious off/on.     She is having right neck/shoulder pain .     Pain is throbbing in her muscles mostly.      From 03/25/2017  MS:   She is on Aubagio and tolerates it well.   No definite  exacerbation.    She feels more tired.    Gait/strength/sensation: Her gait is unchanged with reduced balance.   She stumbles but no falls.    She is exercising less and denies any significant weakness.    Currently, she denies dysesthesia and the right sided numbness is minimal.   Shoulder pain:   She has right shoulder pain and feels weaker in her right arm.   Externally rotating hte shoulder is painful at times.    Bladder/bowel: She has urinary frequency but no recent incontinence.   She never started Myrbetriq.    Bowel function is fine.  Vision: She denies any blurriness with vision when she wears her glasses. She denies any diplopia.  Fatigue/sleep: She has fatigue and excessive daytime sleepiness (falls asleep in church and when relaxing).  She falls asleep easily but wakes up a lot.   She falls asleep at 11 pm and wakes up at 2 am and may not fall back asleep.  Mood/cognition: She denies any depression or anxiety, but she gets aggravated easily. She feels  her cognition is generally doing well. She tries to read daily.   She has two cousins with MS. One is bedridden and the other is doing well.   MS History:    She began to experience numbness in the right arm in early 2016. She noted numbness in the hand and higher up. A couple months later she began to have some pain in the shoulder and the neck area. On 05/07/2015, she had an MRI of the cervical spine that I personally reviewed. It shows a small focus of hyperintense signal adjacent to C2-C3 most evident on inversion recovery images but more subtly present on axial images as well. It was suggestive of MS and an MRI of the brain was performed on 06/09/2015. I personally reviewed those images. That shows multiple white matter foci in a pattern consistent with the diagnosis of MS. Specifically, foci of present in the periventricular and juxtacortical white matter. There is one enhancing focus in the right occipital lobe. The  presence of an enhancing lesion along with chronic periventricular and juxtacortical lesions and the presence of the spinal cord plaque allow [place] to make the diagnosis of multiple sclerosis with relative certainty.  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 This was felt to be symptomatic as she had eyelid drooping and she also had severe headaches though there was no definite evidence of rupture. Surgery was performed by Dr. Christella Noa.    REVIEW OF SYSTEMS: Constitutional: No fevers, chills, sweats, or change in appetite.   She notes some fatigue and mild sleepiness.  She has a delayed phase sleep disorder Eyes: No visual changes, double vision, eye pain Ear, nose and throat: No hearing loss, ear pain, nasal congestion, sore throat Cardiovascular: No chest pain, palpitations Respiratory: No shortness of breath at rest or with exertion.   No wheezes GastrointestinaI: No nausea, vomiting, diarrhea, abdominal pain, fecal incontinence Genitourinary: No dysuria, urinary retention or frequency.  No nocturia. Musculoskeletal: No neck pain, back pain Integumentary: No rash, pruritus, skin lesions Neurological: as above Psychiatric: No depression at this time.  No anxiety Endocrine: No palpitations, diaphoresis, change in appetite, change in weigh or increased thirst Hematologic/Lymphatic: No anemia, purpura, petechiae. Allergic/Immunologic: No itchy/runny eyes, nasal congestion, recent allergic reactions, rashes  ALLERGIES: Allergies  Allergen Reactions  . Lisinopril     Swelling of the lip    HOME MEDICATIONS:  Current Outpatient Medications:  .  cholecalciferol (VITAMIN D) 1000 units tablet, TAKE 1 TABLET BY MOUTH EVERY DAY, Disp: 60 tablet, Rfl: 1 .  cyclobenzaprine (FLEXERIL) 5 MG tablet, One or two po qHS, Disp: 60 tablet, Rfl: 5 .  terbinafine (LAMISIL) 250 MG tablet, TAKE 1 TABLET BY MOUTH EVERY DAY, Disp: 30  tablet, Rfl: 2 .  Teriflunomide (AUBAGIO) 14 MG TABS, Take 1 tablet by mouth daily., Disp: 84 tablet, Rfl: 3 .  etodolac (LODINE) 400 MG tablet, Take 1 tablet (400 mg total) by mouth 2 (two) times daily., Disp: 60 tablet, Rfl: 5 .  gabapentin (NEURONTIN) 300 MG capsule, Take one or two at bedtime, Disp: 60 capsule, Rfl: 5  Current Facility-Administered Medications:  .  0.9 %  sodium chloride infusion, 500 mL, Intravenous, Once, Nandigam, Venia Minks, MD  PAST MEDICAL HISTORY: Past Medical History:  Diagnosis Date  . Amyotrophic lateral sclerosis/progressive muscular atrophy ([place])   . Hypertension   . 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 Migraine   . Multiple  sclerosis ([place])   . Neuromuscular disorder ([place])     PAST SURGICAL HISTORY: Past Surgical History:  Procedure Laterality Date  . BREAST BIOPSY Right   . CRANIOTOMY  08/17/2012   Procedure: CRANIOTOMY INTRACRANIAL ANEURYSM FOR CAROTID;  Surgeon: Winfield Cunas, MD;  Location: [facility];  Service: Neurosurgery;  Laterality: Left;  Craniotomy for Aneurysm Clipping    FAMILY HISTORY: Family History  Problem Relation Age of Onset  . Heart disease Father   . Dementia Unknown   . Breast cancer Neg Hx   . Colon cancer Neg Hx   . Colon polyps Neg Hx   . Esophageal cancer Neg Hx   . Liver cancer Neg Hx   . Ovarian cancer Neg Hx   . Pancreatic cancer Neg Hx   . Rectal cancer Neg Hx   . Stomach cancer Neg Hx     SOCIAL HISTORY:  Social History   Socioeconomic History  . Marital status: Legally Separated    Spouse name: Not on file  . Number of children: Not on file  . Years of education: Not on file  . Highest education level: Not on file  Occupational History  . Not on file  Social Needs  . Financial resource strain: Not on file  . Food insecurity:    Worry: Not on file    Inability: Not on file  . Transportation needs:    Medical: Not on file    Non-medical: Not on file  Tobacco Use  . Smoking status:  Current Every Day Smoker    Packs/day: 0.25    Years: 12.00    Pack years: 3.00    Types: Cigarettes  . Smokeless tobacco: Never Used  Substance and Sexual Activity  . Alcohol use: Yes    Alcohol/week: 6.0 standard drinks    Types: 6 Cans of beer per week  . Drug use: Yes    Types: Marijuana    Comment: occasional  . Sexual activity: Not on file  Lifestyle  . Physical activity:    Days per week: Not on file    Minutes per session: Not on file  . Stress: Not on file  Relationships  . Social connections:    Talks on phone: Not on file    Gets together: Not on file    Attends religious service: Not on file    Active member of club or organization: Not on file    Attends meetings of clubs or organizations: Not on file    Relationship status: Not on file  . Intimate partner violence:    Fear of current or ex partner: Not on file    Emotionally abused: Not on file    Physically abused: Not on file    Forced sexual activity: Not on file  Other Topics Concern  . Not on file  Social History Narrative   Patient does not drink caffeine.   Patient is right handed.      PHYSICAL EXAM  Vitals:   10/05/18 0853  BP: (!) 187/89  Pulse: 68  Resp: 14  Weight: 128 lb (58.1 kg)  Height: 5\' 2"  (1.575 m)    Body mass index is 23.41 kg/m.   General: The patient is well-developed and well-nourished and in no acute distress  Musculoskeletal: Tender over the right trapezius and right AC joint and subacromial bursa.    ROM of neck and shoulder are good.   Neurologic Exam  Mental status: The patient is alert and oriented  x 3 at the time of the examination. The patient has apparent normal recent and remote memory, with an apparently normal attention span and concentration ability. Speech is normal.  Cranial nerves: Extraocular movements are full. Facial strength and sensation is normal. Trapezius strength is strong. The tongue is midline, and the patient has symmetric elevation of  the soft palate. No obvious hearing deficits are noted.  Motor: Muscle bulk is normal. Tone is normal. Strength is 5 / 5 in all 4 extremities.   Sensory: She had intact sensation to touch and vibration in the arms and legs..  Coordination: Good finger nose finger and heel to shin  Gait and station: Station is normal. Gait is normal. Tandem gait mildly wide Romberg is negative.   Reflexes: Deep tendon reflexes are symmetric but increased at the knees with spread to the other side.     DIAGNOSTIC DATA (LABS, IMAGING, TESTING) - I reviewed patient records, labs, notes, testing and imaging myself where available.  Lab Results  Component Value Date   WBC 7.6 02/13/2018   HGB 13.5 02/13/2018   HCT 40.2 02/13/2018   MCV 101 (H) 02/13/2018   PLT 238 02/13/2018      Component Value Date/Time   NA 146 (H) 02/13/2018 1049   K 4.0 02/13/2018 1049   CL 106 02/13/2018 1049   CO2 20 02/13/2018 1049   GLUCOSE 71 02/13/2018 1049   GLUCOSE 77 03/23/2016 1443   BUN 18 02/13/2018 1049   CREATININE 0.94 02/13/2018 1049   CREATININE 0.85 03/23/2016 1443   CALCIUM 9.8 02/13/2018 1049   PROT 7.2 02/13/2018 1049   ALBUMIN 4.6 02/13/2018 1049   AST 16 02/13/2018 1049   ALT 11 02/13/2018 1049   ALKPHOS 78 02/13/2018 1049   BILITOT 0.4 02/13/2018 1049   GFRNONAA 67 02/13/2018 1049   GFRNONAA 76 03/23/2016 1443   GFRAA 77 02/13/2018 1049   GFRAA 87 03/23/2016 1443   Lab Results  Component Value Date   CHOL 245 (H) 11/05/2009   HDL 98 11/05/2009   LDLCALC 131 (H) 11/05/2009   TRIG 80 11/05/2009   CHOLHDL 2.5 Ratio 11/05/2009   No results found for: HGBA1C No results found for: VITAMINB12 Lab Results  Component Value Date   TSH 1.110 03/25/2017       ASSESSMENT AND PLAN  Multiple sclerosis (HCC)  Gait disturbance  Acute pain of right shoulder  Neck pain  Insomnia, unspecified type   1.  Continue Aubagio.      2.   TPI right trapezius with 40 mg Depo-medrol in  marcaine using sterile technique.    She tolerated the injection well and there were no complications.  3.   Right subacromial bursa injection with 40 mg Depo-medrol in marcaine using sterile technique.    She tolerated the injection well and there were no complications.  4.  Remain active and exercises as tolerated.  5.  She will return to see me in 6 months or sooner if problems.   Rhia Blatchford A. Felecia Shelling, MD, PhD 81/15/7262, 0:35 AM Certified in Neurology, Clinical Neurophysiology, Sleep Medicine, Pain Medicine and Neuroimaging  [facility] [address], [place], [place] [address] [phone]

## 2018-10-09 ENCOUNTER — Telehealth: Payer: Self-pay | Admitting: Neurology

## 2018-10-09 MED ORDER — TERIFLUNOMIDE 14 MG PO TABS: 1.0000 | ORAL_TABLET | Freq: Every day | ORAL | 3 refills | Status: DC

## 2018-10-09 NOTE — Telephone Encounter (Signed)
I called [facility]. New Aubagio rx. was escribed to them on 10/05/18, but I have escribed it again today./fim

## 2018-10-09 NOTE — Addendum Note (Signed)
Addended by: France Ravens I on: 10/09/2018 04:14 PM   Modules accepted: Orders

## 2018-10-09 NOTE — Telephone Encounter (Signed)
Darlene Mcconnell with [facility] calling to discuss quantity for Teriflunomide (AUBAGIO) 14 MG TABS.

## 2018-10-10 ENCOUNTER — Telehealth: Payer: Self-pay | Admitting: *Deleted

## 2018-10-10 NOTE — Telephone Encounter (Signed)
PA for Aubagio 14mg  was complete and faxed to [facility]. Dx: RRMS (G35).  PA approved until 10/01/19.  PA# [id number]./[id number]

## 2018-10-28 ENCOUNTER — Other Ambulatory Visit: Payer: Self-pay | Admitting: Neurology

## 2018-11-10 ENCOUNTER — Other Ambulatory Visit: Payer: Self-pay | Admitting: Neurology

## 2018-11-29 ENCOUNTER — Emergency Department (HOSPITAL_COMMUNITY): Payer: Medicaid Other

## 2018-11-29 ENCOUNTER — Other Ambulatory Visit: Payer: Self-pay

## 2018-11-29 ENCOUNTER — Emergency Department (HOSPITAL_COMMUNITY)
Admission: EM | Admit: 2018-11-29 | Discharge: 2018-11-29 | Disposition: A | Discharge status: Home / Self Care | Payer: Medicaid Other | Attending: Emergency Medicine | Admitting: Emergency Medicine

## 2018-11-29 ENCOUNTER — Encounter (HOSPITAL_COMMUNITY): Payer: Self-pay | Admitting: *Deleted

## 2018-11-29 DIAGNOSIS — Y939 Activity, unspecified: Secondary | ICD-10-CM | POA: Insufficient documentation

## 2018-11-29 DIAGNOSIS — R030 Elevated blood-pressure reading, without diagnosis of hypertension: Secondary | ICD-10-CM | POA: Diagnosis not present

## 2018-11-29 DIAGNOSIS — I10 Essential (primary) hypertension: Secondary | ICD-10-CM | POA: Insufficient documentation

## 2018-11-29 DIAGNOSIS — M542 Cervicalgia: Secondary | ICD-10-CM | POA: Insufficient documentation

## 2018-11-29 DIAGNOSIS — F17210 Nicotine dependence, cigarettes, uncomplicated: Secondary | ICD-10-CM | POA: Insufficient documentation

## 2018-11-29 DIAGNOSIS — Y92410 Unspecified street and highway as the place of occurrence of the external cause: Secondary | ICD-10-CM | POA: Insufficient documentation

## 2018-11-29 DIAGNOSIS — S8991XA Unspecified injury of right lower leg, initial encounter: Secondary | ICD-10-CM | POA: Diagnosis not present

## 2018-11-29 DIAGNOSIS — S069X9A Unspecified intracranial injury with loss of consciousness of unspecified duration, initial encounter: Secondary | ICD-10-CM | POA: Diagnosis not present

## 2018-11-29 DIAGNOSIS — G35 Multiple sclerosis: Secondary | ICD-10-CM | POA: Diagnosis not present

## 2018-11-29 DIAGNOSIS — G4489 Other headache syndrome: Secondary | ICD-10-CM | POA: Diagnosis not present

## 2018-11-29 DIAGNOSIS — R1084 Generalized abdominal pain: Secondary | ICD-10-CM | POA: Diagnosis not present

## 2018-11-29 DIAGNOSIS — Z79899 Other long term (current) drug therapy: Secondary | ICD-10-CM | POA: Insufficient documentation

## 2018-11-29 DIAGNOSIS — R52 Pain, unspecified: Secondary | ICD-10-CM | POA: Diagnosis not present

## 2018-11-29 DIAGNOSIS — M25519 Pain in unspecified shoulder: Secondary | ICD-10-CM | POA: Diagnosis not present

## 2018-11-29 DIAGNOSIS — Y998 Other external cause status: Secondary | ICD-10-CM | POA: Insufficient documentation

## 2018-11-29 DIAGNOSIS — M7989 Other specified soft tissue disorders: Secondary | ICD-10-CM | POA: Diagnosis not present

## 2018-11-29 DIAGNOSIS — S199XXA Unspecified injury of neck, initial encounter: Secondary | ICD-10-CM | POA: Diagnosis not present

## 2018-11-29 DIAGNOSIS — R51 Headache: Secondary | ICD-10-CM | POA: Diagnosis not present

## 2018-11-29 MED ORDER — LIDOCAINE 5 % EX PTCH: 1.0000 | MEDICATED_PATCH | CUTANEOUS | 0 refills | Status: DC

## 2018-11-29 MED ORDER — METHOCARBAMOL 500 MG PO TABS: 500.0000 mg | ORAL_TABLET | Freq: Two times a day (BID) | ORAL | 0 refills | Status: DC

## 2018-11-29 MED ORDER — ACETAMINOPHEN 500 MG PO TABS
1000.0000 mg | ORAL_TABLET | Freq: Once | ORAL | Status: AC
  Administered 2018-11-29: 1000 mg via ORAL
  Filled 2018-11-29: qty 2

## 2018-11-29 MED ORDER — METHOCARBAMOL 500 MG PO TABS
500.0000 mg | ORAL_TABLET | Freq: Once | ORAL | Status: AC
  Administered 2018-11-29: 500 mg via ORAL
  Filled 2018-11-29: qty 1
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
## 2018-11-29 NOTE — ED Notes (Signed)
Declined W/C at D/C and was escorted to lobby by RN. 
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
## 2018-12-05 ENCOUNTER — Ambulatory Visit: Payer: Medicaid Other | Admitting: Family Medicine

## 2018-12-05 VITALS — BP 165/100 | HR 82 | Temp 98.3°F | Wt 129.0 lb

## 2018-12-05 DIAGNOSIS — I10 Essential (primary) hypertension: Secondary | ICD-10-CM | POA: Diagnosis not present

## 2018-12-05 DIAGNOSIS — S46819D Strain of other muscles, fascia and tendons at shoulder and upper arm level, unspecified arm, subsequent encounter: Secondary | ICD-10-CM | POA: Diagnosis not present

## 2018-12-05 MED ORDER — METHOCARBAMOL 500 MG PO TABS: 500.0000 mg | ORAL_TABLET | Freq: Two times a day (BID) | ORAL | 0 refills | Status: DC

## 2018-12-05 MED ORDER — HYDROCHLOROTHIAZIDE 12.5 MG PO CAPS: 12.5000 mg | ORAL_CAPSULE | Freq: Every day | ORAL | 2 refills | Status: DC
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
## 2018-12-05 NOTE — Assessment & Plan Note (Signed)
Chronic.  Uncontrolled.  History of angioedema with ACE inhibitor. - Initiating HCTZ 12.5 mg daily and will follow-up with PCP
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
## 2018-12-18 ENCOUNTER — Encounter: Payer: Self-pay | Admitting: Family Medicine

## 2018-12-18 ENCOUNTER — Ambulatory Visit: Payer: Medicaid Other | Admitting: Family Medicine

## 2018-12-18 ENCOUNTER — Other Ambulatory Visit: Payer: Self-pay

## 2018-12-18 VITALS — BP 162/82 | HR 73 | Temp 98.0°F | Ht 62.0 in | Wt 127.8 lb

## 2018-12-18 DIAGNOSIS — I10 Essential (primary) hypertension: Secondary | ICD-10-CM

## 2018-12-18 DIAGNOSIS — M546 Pain in thoracic spine: Secondary | ICD-10-CM | POA: Diagnosis not present

## 2018-12-18 MED ORDER — GABAPENTIN 300 MG PO CAPS: 300.0000 mg | ORAL_CAPSULE | Freq: Two times a day (BID) | ORAL | 5 refills | Status: DC

## 2018-12-18 MED ORDER — HYDROCHLOROTHIAZIDE 12.5 MG PO CAPS: 25.0000 mg | ORAL_CAPSULE | Freq: Every day | ORAL | 0 refills | Status: DC

## 2018-12-18 NOTE — Patient Instructions (Signed)
It was a pleasure to see you today! Thank you for choosing [facility] for your primary care. Darlene Mcconnell was seen for back pain and high blood pressure  Please take the following medications and ordered to address the issues discussed during the visit. Physical therapy will call you to schedule a visit.   Best,  Marny Lowenstein, MD, MS FAMILY MEDICINE RESIDENT - PGY2 12/18/2018 2:17 PM
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
## 2018-12-20 NOTE — Assessment & Plan Note (Signed)
Left thoracic and lumbar back pain. No red flag symptoms. Already on scheduled NSAIDs and muscle relaxer. Plan to increase pt gabapentin and refer to PT for pain relief. Also provided back exercises for home therapy. Pt to f/u in 4 weeks if not improving.

## 2018-12-20 NOTE — Assessment & Plan Note (Signed)
Still has elevated BP. Plan to increase HCTZ to 25 mg. Pt to f/u w/ PCP in 4 weeks for BP check.

## 2018-12-28 ENCOUNTER — Other Ambulatory Visit: Payer: Self-pay

## 2018-12-28 ENCOUNTER — Encounter: Payer: Self-pay | Admitting: Physical Therapy

## 2018-12-28 ENCOUNTER — Ambulatory Visit: Payer: Medicaid Other | Attending: Family Medicine | Admitting: Physical Therapy

## 2018-12-28 DIAGNOSIS — M62830 Muscle spasm of back: Secondary | ICD-10-CM | POA: Insufficient documentation

## 2018-12-28 DIAGNOSIS — M545 Low back pain: Secondary | ICD-10-CM | POA: Diagnosis not present

## 2018-12-28 DIAGNOSIS — G8929 Other chronic pain: Secondary | ICD-10-CM | POA: Diagnosis not present
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
## 2019-01-04 ENCOUNTER — Telehealth: Payer: Self-pay | Admitting: *Deleted

## 2019-01-04 ENCOUNTER — Ambulatory Visit: Payer: Medicaid Other | Admitting: Physical Therapy

## 2019-01-04 ENCOUNTER — Telehealth: Payer: Self-pay | Admitting: Family Medicine

## 2019-01-04 DIAGNOSIS — M545 Low back pain: Principal | ICD-10-CM

## 2019-01-04 DIAGNOSIS — G8929 Other chronic pain: Secondary | ICD-10-CM | POA: Diagnosis not present

## 2019-01-04 DIAGNOSIS — M62830 Muscle spasm of back: Secondary | ICD-10-CM

## 2019-01-04 NOTE — Therapy (Signed)
[place], [place], [address] Phone: [phone]   Fax:  [phone]  Physical Therapy Treatment  Patient Details  Name: Darlene Mcconnell MRN: [id number] Date of Birth: 1956-11-28 Referring Provider (PT): Zenia Resides, MD   Encounter Date: 01/04/2019  PT End of Session - 01/04/19 1138    Visit Number  2    Number of Visits  13    Date for PT Re-Evaluation  02/15/19    Authorization Type  MCD ( resubmit at 4th visit)    PT Start Time  1100    PT Stop Time  1148    PT Time Calculation (min)  48 min    Activity Tolerance  Patient tolerated treatment well    Behavior During Therapy  [facility] for tasks assessed/performed       Past Medical History:  Diagnosis Date  . Amyotrophic lateral sclerosis/progressive muscular atrophy ([place])   . Hypertension   . 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 Migraine   . Multiple sclerosis ([place])   . Neuromuscular disorder [facility])     Past Surgical History:  Procedure Laterality Date  . BREAST BIOPSY Right   . CRANIOTOMY  08/17/2012   Procedure: CRANIOTOMY INTRACRANIAL ANEURYSM FOR CAROTID;  Surgeon: Winfield Cunas, MD;  Location: [place] [facility];  Service: Neurosurgery;  Laterality: Left;  Craniotomy for Aneurysm Clipping    There were no vitals filed for this visit.  Subjective Assessment - 01/04/19 1137    Subjective  Pt relays her Rt knee is really bothering her and she called MD and is trying to get to see them about this, her back continues to bother her.     Currently in Pain?  Yes    Pain Score  6     Pain Location  Back   back and Rt knee   Pain Orientation  Right;Lower    Pain Descriptors / Indicators  Aching;Sore;Tightness    Pain Onset  More than a month ago    Pain Frequency  Constant                       [facility] Adult PT Treatment/Exercise - 01/04/19 0001      Knee/Hip Exercises: Stretches   Active Hamstring Stretch  Both;2 reps;30  seconds    Active Hamstring Stretch Limitations  supine with strap    Piriformis Stretch  Right;Left;2 reps;30 seconds    Other Knee/Hip Stretches  lower trunk rotation 10 sec X 5 each, SKTC 30 sec X 2 ea      Knee/Hip Exercises: Aerobic   Recumbent Bike  5 min no resistance      Knee/Hip Exercises: Supine   Bridges  10 reps    Bridges Limitations  small ROM    Other Supine Knee/Hip Exercises  marching 2 x 10      Knee/Hip Exercises: Sidelying   Clams  15 each side      Modalities   Modalities  Teacher, English as a foreign language Location  lumbar    Electrical Stimulation Action  TENS IFC    Electrical Stimulation Parameters  tolerance, pt prone    Electrical Stimulation Goals  Pain             PT Education - 01/04/19 1138    Education Details  HEP, TENS    Person(s) Educated  Patient    Methods  Explanation;Verbal  cues;Handout    Comprehension  Verbalized understanding;Returned demonstration;Need further instruction       PT Short Term Goals - 12/28/18 1018      PT SHORT TERM GOAL #1   Title  pt to be I with inital HEP    Baseline  no previous HEP    Time  3    Period  Weeks    Status  New    Target Date  01/18/19      PT SHORT TERM GOAL #2   Title  pt to verbalize and demo proper posture and lifting mechanics to reduce and prevent low back pain    Baseline  no previous knowledge of proper posture    Time  3    Period  Weeks    Status  New    Target Date  01/18/19        PT Long Term Goals - 12/28/18 1019      PT LONG TERM GOAL #1   Title  increase Trunk mobility by >/= 10 degrees in all planes with </= 1/10 pain for functional mobility required for ADLs     Baseline  flexion 60, extension 20,  L Side bending15 , R sidebending 10 with 4/10 pain     Time  6    Period  Weeks    Status  New    Target Date  02/15/19      PT LONG TERM GOAL #2   Title  pt to be able to stand and walk >/= 45 min with report of  </= 1/10 pain for fucntional endurance for community amb    Baseline   standing/ 30 min with 6/10 pain , walking unsure due to pain    Time  6    Period  Weeks    Status  New    Target Date  02/15/19      PT LONG TERM GOAL #3   Title  pt to be I with all HEP given as of last visit to maintain and progress current level of function    Baseline  no previous HEP    Time  6    Period  Weeks    Status  New    Target Date  02/15/19            Plan - 01/04/19 1145    Clinical Impression Statement  Pt progressed with lumbar stretching and beginning strengthening program with good tolerance today. She was trialed with TENS unit to help decrease pain and spasm with good return. She was then recommended home TENS unit. PT will continue to progress as able .    Rehab Potential  Good    PT Frequency  2x / week    PT Duration  6 weeks    PT Treatment/Interventions  ADLs/Self Care Home Management;Cryotherapy;Electrical Stimulation;Iontophoresis 4mg /ml Dexamethasone;Moist Heat;Traction;Ultrasound;Therapeutic exercise;Therapeutic activities;Patient/family education;Dry needling;Taping;Manual techniques    PT Next Visit Plan  review/ update HEP PRN, potential SIJ R post rotation, STW along lumbar paraspinals, add childs pose low back stretch (did not add this today due to knee pain, did SKTC stretch instead, Aaron Edelman), modalities PRN (HX of MS avoid overheating)    PT Home Exercise Plan  lower trunk rotation, supine marching, hamstring stretching, posterior pelvic tilt    Consulted and Agree with Plan of Care  Patient       Patient will benefit from skilled therapeutic intervention in order to improve the following deficits and impairments:  Pain, Increased fascial  restricitons, Increased muscle spasms, Postural dysfunction, Improper body mechanics, Decreased endurance, Decreased activity tolerance, Decreased range of motion, Decreased strength  Visit Diagnosis: Chronic bilateral low back pain  without sciatica  Muscle spasm of back     Problem List Patient Active Problem List   Diagnosis Date Noted  . Primary hypertension 12/05/2018  . Trapezius strain, unspecified laterality, subsequent encounter 12/05/2018  . Insomnia 10/05/2018  . Back pain 03/15/2018  . Neck pain 10/26/2017  . Other fatigue 03/25/2017  . Low vitamin D level 03/25/2017  . Routine adult health maintenance 09/20/2016  . Change in voice 03/25/2016  . Overactive bladder 03/25/2016  . 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 Multiple sclerosis ([place]) 06/25/2015  . Numbness and tingling in right hand 06/25/2015  . Gait disturbance 06/25/2015  . High risk medication use 06/25/2015  . History of cerebral aneurysm repair 06/25/2015  . Orthostatic hypotension 03/20/2015  . Right shoulder pain 03/20/2015  . Low back pain 03/20/2015  . Dry cough 03/20/2015  . Rash of hands 03/05/2014  . Concern about STD in female without diagnosis 03/05/2014  . Chest pain 03/04/2014  . Essential hypertension, benign 03/04/2014    Silvestre Mesi 01/04/2019, 11:48 AM  [facility] [address] [place], [place], [address] Phone: [phone]   Fax:  [phone]  Name: MUNA DEMERS MRN: [id number] Date of Birth: January 06, 1956

## 2019-01-04 NOTE — Telephone Encounter (Signed)
Will forward to Dr. Grandville Silos to advise. Jazmin Hartsell,CMA

## 2019-01-04 NOTE — Telephone Encounter (Signed)
Faxed completed/signed PA etodolac to [facility] at [phone]. Received fax confirmation. Waiting on determination.  Pt has tried/failed: ibuprofen, tylenol, ketorolac, naproxen.

## 2019-01-04 NOTE — Telephone Encounter (Signed)
Pt calling because her knee has gotten worse since her OV on 12-23. She said it keeps swelling and she will be walking and it will just give out on her. Pt would like referral to get her knee looked at. Since she was just seen in December for this she was hoping to not have to make another appt. Pt callback number is [phone]. Please advise.

## 2019-01-04 NOTE — Telephone Encounter (Signed)
At last visit, documented problems were around back. Knee is a new problem. Pt would need to be seen in clinic before referral to be placed. Please call pt and inform.

## 2019-01-04 NOTE — Patient Instructions (Signed)

## 2019-01-09 NOTE — Telephone Encounter (Signed)
PA approved 01/08/2019-01/03/2020. [id number]. Faxed notice of approval to [facility] at [phone]. Received fax confirmation.

## 2019-01-09 NOTE — Telephone Encounter (Signed)
LVM informing pt that she would need to be seen before a referral could be placed. Salvatore Marvel, CMA

## 2019-01-11 ENCOUNTER — Ambulatory Visit: Payer: Medicaid Other | Admitting: Physical Therapy

## 2019-01-11 ENCOUNTER — Encounter: Payer: Self-pay | Admitting: Physical Therapy

## 2019-01-11 DIAGNOSIS — M545 Low back pain: Principal | ICD-10-CM

## 2019-01-11 DIAGNOSIS — M62830 Muscle spasm of back: Secondary | ICD-10-CM

## 2019-01-11 DIAGNOSIS — G8929 Other chronic pain: Secondary | ICD-10-CM

## 2019-01-11 NOTE — Therapy (Deleted)
[place], [place], [address] Phone: [phone]   Fax:  [phone]  Physical Therapy Evaluation  Patient Details  Name: Darlene Mcconnell MRN: [id number] Date of Birth: 14-Sep-1956 Referring Provider (PT): Zenia Resides, MD   Encounter Date: 01/11/2019    Past Medical History:  Diagnosis Date  . Amyotrophic lateral sclerosis/progressive muscular atrophy ([place])   . Hypertension   . 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 Migraine   . Multiple sclerosis ([place])   . Neuromuscular disorder [facility])     Past Surgical History:  Procedure Laterality Date  . BREAST BIOPSY Right   . CRANIOTOMY  08/17/2012   Procedure: CRANIOTOMY INTRACRANIAL ANEURYSM FOR CAROTID;  Surgeon: Winfield Cunas, MD;  Location: [place] [facility];  Service: Neurosurgery;  Laterality: Left;  Craniotomy for Aneurysm Clipping    There were no vitals filed for this visit.   Subjective Assessment - 01/11/19 0854    Subjective  " I think I am getting alittle better, when I lay down it starts to really bother me"     Patient Stated Goals  to get back to normal,    Currently in Pain?  Yes    Pain Score  6     Pain Location  Back    Pain Orientation  Right;Lower    Aggravating Factors   laying down, and sitting for too long     Pain Relieving Factors  walking around, medication                    Objective measurements completed on examination: See above findings.      [facility] Adult PT Treatment/Exercise - 01/11/19 0855      Lumbar Exercises: Supine   Bent Knee Raise  20 reps;Other (comment)   alternating L/R and keeping back flat x 2 sets     Knee/Hip Exercises: Stretches   Active Hamstring Stretch  3 reps;30 seconds   PNF contract/ relax     Knee/Hip Exercises: Aerobic   Nustep  L5 x x 5 min UE/Le      Knee/Hip Exercises: Supine   Straight Leg Raises  2 sets;10 reps      Electrical Stimulation   Electrical  Stimulation Location  lumbar    Electrical Stimulation Action  IFC    Electrical Stimulation Parameters  L 10 x 10 min @ 100% scan    Electrical Stimulation Goals  Pain      Manual Therapy   Manual Therapy  Muscle Energy Technique;Soft tissue mobilization;Joint mobilization    Manual therapy comments  MTPR along R lumbar paraspinals     Joint Mobilization  LAD grade V along RLE only    Muscle Energy Technique  resisted R hip flexor MET with combined R anterior innominate mob grade #   in L sidelying              PT Short Term Goals - 12/28/18 1018      PT SHORT TERM GOAL #1   Title  pt to be I with inital HEP    Baseline  no previous HEP    Time  3    Period  Weeks    Status  New    Target Date  01/18/19      PT SHORT TERM GOAL #2   Title  pt to verbalize and demo proper posture and lifting mechanics to reduce and prevent low back pain  Baseline  no previous knowledge of proper posture    Time  3    Period  Weeks    Status  New    Target Date  01/18/19        PT Long Term Goals - 12/28/18 1019      PT LONG TERM GOAL #1   Title  increase Trunk mobility by >/= 10 degrees in all planes with </= 1/10 pain for functional mobility required for ADLs     Baseline  flexion 60, extension 20,  L Side bending15 , R sidebending 10 with 4/10 pain     Time  6    Period  Weeks    Status  New    Target Date  02/15/19      PT LONG TERM GOAL #2   Title  pt to be able to stand and walk >/= 45 min with report of </= 1/10 pain for fucntional endurance for community amb    Baseline   standing/ 30 min with 6/10 pain , walking unsure due to pain    Time  6    Period  Weeks    Status  New    Target Date  02/15/19      PT LONG TERM GOAL #3   Title  pt to be I with all HEP given as of last visit to maintain and progress current level of function    Baseline  no previous HEP    Time  6    Period  Weeks    Status  New    Target Date  02/15/19               Patient  will benefit from skilled therapeutic intervention in order to improve the following deficits and impairments:     Visit Diagnosis: No diagnosis found.     Problem List Patient Active Problem List   Diagnosis Date Noted  . Primary hypertension 12/05/2018  . Trapezius strain, unspecified laterality, subsequent encounter 12/05/2018  . Insomnia 10/05/2018  . Back pain 03/15/2018  . Neck pain 10/26/2017  . Other fatigue 03/25/2017  . Low vitamin D level 03/25/2017  . Routine adult health maintenance 09/20/2016  . Change in voice 03/25/2016  . Overactive bladder 03/25/2016  . 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 Multiple sclerosis ([place]) 06/25/2015  . Numbness and tingling in right hand 06/25/2015  . Gait disturbance 06/25/2015  . High risk medication use 06/25/2015  . History of cerebral aneurysm repair 06/25/2015  . Orthostatic hypotension 03/20/2015  . Right shoulder pain 03/20/2015  . Low back pain 03/20/2015  . Dry cough 03/20/2015  . Rash of hands 03/05/2014  . Concern about STD in female without diagnosis 03/05/2014  . Chest pain 03/04/2014  . Essential hypertension, benign 03/04/2014    Starr Lake 01/11/2019, 9:31 AM  [facility] [address] [place], [place], [address] Phone: [phone]   Fax:  [phone]  Name: Darlene Mcconnell MRN: [id number] Date of Birth: 1956-11-27

## 2019-01-11 NOTE — Therapy (Signed)
[place], [place], [address] Phone: [phone]   Fax:  [phone]  Physical Therapy Treatment  Patient Details  Name: Darlene Mcconnell MRN: [id number] Date of Birth: 1956-08-20 Referring Provider (PT): Darlene Resides, MD   Encounter Date: 01/11/2019  PT End of Session - 01/11/19 0936    Visit Number  3    Number of Visits  13    Date for PT Re-Evaluation  02/15/19    Authorization Type  MCD ( resubmit at 4th visit)    Authorization Time Period  initial auth 01/03/2019 - 01/23/2019    Authorization - Visit Number  2    Authorization - Number of Visits  3    PT Start Time  0848    PT Stop Time  0938    PT Time Calculation (min)  50 min    Activity Tolerance  Patient tolerated treatment well    Behavior During Therapy  [facility] for tasks assessed/performed       Past Medical History:  Diagnosis Date  . Amyotrophic lateral sclerosis/progressive muscular atrophy (Darlene Mcconnell)   . Hypertension   . 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 Migraine   . Multiple sclerosis ([place])   . Neuromuscular disorder [facility])     Past Surgical History:  Procedure Laterality Date  . BREAST BIOPSY Right   . CRANIOTOMY  08/17/2012   Procedure: CRANIOTOMY INTRACRANIAL ANEURYSM FOR CAROTID;  Surgeon: Darlene Cunas, MD;  Location: [place] [facility];  Service: Neurosurgery;  Laterality: Left;  Craniotomy for Aneurysm Clipping    There were no vitals filed for this visit.  Subjective Assessment - 01/11/19 0854    Subjective  " I think I am getting alittle better, when I lay down it starts to really bother me"     Patient Stated Goals  to get back to normal,    Currently in Pain?  Yes    Pain Score  6     Pain Location  Back    Pain Orientation  Right;Lower    Aggravating Factors   laying down, and sitting for too long     Pain Relieving Factors  walking around, medication                       [facility] Adult PT  Treatment/Exercise - 01/11/19 0855      Lumbar Exercises: Supine   Bent Knee Raise  20 reps;Other (comment)   alternating L/R and keeping back flat x 2 sets     Knee/Hip Exercises: Stretches   Active Hamstring Stretch  3 reps;30 seconds   PNF contract/ relax     Knee/Hip Exercises: Aerobic   Nustep  L5 x x 5 min UE/Le      Knee/Hip Exercises: Supine   Straight Leg Raises  2 sets;10 reps      Electrical Stimulation   Electrical Stimulation Location  lumbar    Electrical Stimulation Action  IFC    Electrical Stimulation Parameters  L 10 x 10 min @ 100% scan    Electrical Stimulation Goals  Pain      Manual Therapy   Manual Therapy  Muscle Energy Technique;Soft tissue mobilization;Joint mobilization    Manual therapy comments  MTPR along R lumbar paraspinals     Joint Mobilization  LAD grade V along RLE only    Muscle Energy Technique  resisted R hip flexor MET with combined R anterior innominate mob grade #  in L sidelying              PT Short Term Goals - 12/28/18 1018      PT SHORT TERM GOAL #1   Title  pt to be I with inital HEP    Baseline  no previous HEP    Time  3    Period  Weeks    Status  New    Target Date  01/18/19      PT SHORT TERM GOAL #2   Title  pt to verbalize and demo proper posture and lifting mechanics to reduce and prevent low back pain    Baseline  no previous knowledge of proper posture    Time  3    Period  Weeks    Status  New    Target Date  01/18/19        PT Long Term Goals - 12/28/18 1019      PT LONG TERM GOAL #1   Title  increase Trunk mobility by >/= 10 degrees in all planes with </= 1/10 pain for functional mobility required for ADLs     Baseline  flexion 60, extension 20,  L Side bending15 , R sidebending 10 with 4/10 pain     Time  6    Period  Weeks    Status  New    Target Date  02/15/19      PT LONG TERM GOAL #2   Title  pt to be able to stand and walk >/= 45 min with report of </= 1/10 pain for fucntional  endurance for community amb    Baseline   standing/ 30 min with 6/10 pain , walking unsure due to pain    Time  6    Period  Weeks    Status  New    Target Date  02/15/19      PT LONG TERM GOAL #3   Title  pt to be I with all HEP given as of last visit to maintain and progress current level of function    Baseline  no previous HEP    Time  6    Period  Weeks    Status  New    Target Date  02/15/19            Plan - 01/11/19 0931    Clinical Impression Statement  Mrs Plant reports continued pain at 7/10 reporting most issue is with sleeping/ laying down. continued working on posterir innominate rotation which she demonstrated improvement in pain with hip flexor MET and hamstring stretching. working on core strengthening which she did well with. due to pain in the knee avoided any quadruped exercise. continued E-stim end of session to calm down pain end of session.     PT Treatment/Interventions  ADLs/Self Care Home Management;Cryotherapy;Electrical Stimulation;Iontophoresis 50m/ml Dexamethasone;Moist Heat;Traction;Ultrasound;Therapeutic exercise;Therapeutic activities;Patient/family education;Dry needling;Taping;Manual techniques    PT Next Visit Plan  Re-submit to MCD potential SIJ R post rotation, STW along lumbar paraspinals, add childs pose low back stretch modalities PRN (HX of MS avoid overheating)    PT Home Exercise Plan  lower trunk rotation, supine marching, hamstring stretching, posterior pelvic tilt    Consulted and Agree with Plan of Care  Patient       Patient will benefit from skilled therapeutic intervention in order to improve the following deficits and impairments:  Pain, Increased fascial restricitons, Increased muscle spasms, Postural dysfunction, Improper body mechanics, Decreased endurance, Decreased activity tolerance, Decreased range of  motion, Decreased strength  Visit Diagnosis: Chronic bilateral low back pain without sciatica  Muscle spasm of  back     Problem List Patient Active Problem List   Diagnosis Date Noted  . Primary hypertension 12/05/2018  . Trapezius strain, unspecified laterality, subsequent encounter 12/05/2018  . Insomnia 10/05/2018  . Back pain 03/15/2018  . Neck pain 10/26/2017  . Other fatigue 03/25/2017  . Low vitamin D level 03/25/2017  . Routine adult health maintenance 09/20/2016  . Change in voice 03/25/2016  . Overactive bladder 03/25/2016  . 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 Multiple sclerosis ([place]) 06/25/2015  . Numbness and tingling in right hand 06/25/2015  . Gait disturbance 06/25/2015  . High risk medication use 06/25/2015  . History of cerebral aneurysm repair 06/25/2015  . Orthostatic hypotension 03/20/2015  . Right shoulder pain 03/20/2015  . Low back pain 03/20/2015  . Dry cough 03/20/2015  . Rash of hands 03/05/2014  . Concern about STD in female without diagnosis 03/05/2014  . Chest pain 03/04/2014  . Essential hypertension, benign 03/04/2014   Starr Lake PT, DPT, LAT, ATC  01/11/19  9:41 AM      [facility] [address] [place], [place], [address] Phone: [phone]   Fax:  [phone]  Name: Darlene Mcconnell MRN: [id number] Date of Birth: [birth year]

## 2019-01-15 ENCOUNTER — Ambulatory Visit (INDEPENDENT_AMBULATORY_CARE_PROVIDER_SITE_OTHER): Payer: Medicaid Other | Admitting: Family Medicine

## 2019-01-15 ENCOUNTER — Other Ambulatory Visit: Payer: Self-pay

## 2019-01-15 ENCOUNTER — Encounter: Payer: Self-pay | Admitting: Family Medicine

## 2019-01-15 VITALS — BP 150/70 | HR 90 | Temp 100.4°F | Wt 124.4 lb

## 2019-01-15 DIAGNOSIS — R269 Unspecified abnormalities of gait and mobility: Secondary | ICD-10-CM | POA: Diagnosis not present

## 2019-01-15 DIAGNOSIS — B9789 Other viral agents as the cause of diseases classified elsewhere: Secondary | ICD-10-CM | POA: Diagnosis not present

## 2019-01-15 DIAGNOSIS — M2352 Chronic instability of knee, left knee: Secondary | ICD-10-CM | POA: Diagnosis not present

## 2019-01-15 DIAGNOSIS — J069 Acute upper respiratory infection, unspecified: Secondary | ICD-10-CM | POA: Diagnosis not present

## 2019-01-15 NOTE — Assessment & Plan Note (Addendum)
  Referral made to sports medicine per patient request. Left knee "giving out" secondary to intrinsic knee issue from [facility] in December or worsening/progression of MS? May benefit from PT.
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
## 2019-01-15 NOTE — Patient Instructions (Signed)
  Nice to see you today! Sorry you're not feeling well. Take tylenol or ibuprofen for fever/pain. Try an antihistamine with decongestant for cough.  If you feel worse instead of better or have trouble breathing/high fevers please return to be seen.  I have also referred you to sports medicine for management of your knee instability. You will receive a call from our office to schedule this appointment. If you do not hear from our office in 1-2 weeks please call us to check on the status of your referral at [phone].  If you have questions or concerns please do not hesitate to call at [phone].  Lucila Maine, DO PGY-3, [facility] 01/15/2019 2:08 PM

## 2019-01-15 NOTE — Assessment & Plan Note (Signed)
  Patient with low grade fever 100.4 in office. No pharyngeal exudate, no lymphadenopathy, lungs are clear to auscultation. She is well hydrated on exam. Sick contact in roommate. Likely viral URI. Advised supportive care. Reasons to return reviewed including worsening instead of improving, shortness of breath, high fevers. Patient verbalized understanding and agreement with plan.

## 2019-01-18 ENCOUNTER — Encounter: Payer: Self-pay | Admitting: Physical Therapy

## 2019-01-18 ENCOUNTER — Ambulatory Visit: Payer: Medicaid Other | Admitting: Physical Therapy

## 2019-01-18 DIAGNOSIS — M62830 Muscle spasm of back: Secondary | ICD-10-CM

## 2019-01-18 DIAGNOSIS — M545 Low back pain, unspecified: Secondary | ICD-10-CM

## 2019-01-18 DIAGNOSIS — G8929 Other chronic pain: Secondary | ICD-10-CM

## 2019-01-18 NOTE — Therapy (Signed)
[place], [place], [address] Phone: [phone]   Fax:  [phone]  Physical Therapy Treatment / Re-submission to [facility]CD  Patient Details  Name: Darlene Mcconnell MRN: [id number] Date of Birth: [birth year] Referring Provider (PT): Darlene Resides, MD   Encounter Date: 01/18/2019  PT End of Session - 01/18/19 1102    Visit Number  4    Number of Visits  13    Date for PT Re-Evaluation  02/15/19    Authorization Type  MCD ( resubmit at 4th visit)    Authorization Time Period  initial auth 01/03/2019 - 01/23/2019    Authorization - Visit Number  3    Authorization - Number of Visits  3    PT Start Time  1016    PT Stop Time  1101    PT Time Calculation (min)  45 min    Activity Tolerance  Patient tolerated treatment well    Behavior During Therapy  [facility] for tasks assessed/performed       Past Medical History:  Diagnosis Date  . Amyotrophic lateral sclerosis/progressive muscular atrophy ([place])   . Hypertension   . 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 Migraine   . Multiple sclerosis ([place])   . Neuromuscular disorder [facility])     Past Surgical History:  Procedure Laterality Date  . BREAST BIOPSY Right   . CRANIOTOMY  08/17/2012   Procedure: CRANIOTOMY INTRACRANIAL ANEURYSM FOR CAROTID;  Surgeon: Winfield Cunas, MD;  Location: [place] [facility];  Service: Neurosurgery;  Laterality: Left;  Craniotomy for Aneurysm Clipping    There were no vitals filed for this visit.  Subjective Assessment - 01/18/19 1024    Subjective  " I am not having any pain today and I am very happy I didn't have to use the heat today"     Currently in Pain?  No/denies    Aggravating Factors   N/A    Pain Relieving Factors  exercise         [facility] PT Assessment - 01/18/19 0001      Assessment   Medical Diagnosis  Acute left-sided thoracic back pain     Referring Provider (PT)  Mcconnell, Darlene Collin, MD      AROM   Lumbar Flexion   88   stretching noted at the end of range   Lumbar Extension  30    Lumbar - Right Side Bend  20    Lumbar - Left Side Bend  20      Strength   Right Knee Flexion  5/5    Right Knee Extension  5/5    Left Knee Flexion  5/5    Left Knee Extension  5/5                   [facility] Adult PT Treatment/Exercise - 01/18/19 1034      Lumbar Exercises: Stretches   Other Lumbar Stretch Exercise  low back stretch walking hands out on physioball 3 x 30 sec      Lumbar Exercises: Machines for Strengthening   Elliptical  L3 x 5 min with L1 elevation.      Knee/Hip Exercises: Standing   Hip Abduction  2 sets;Both;Stengthening;Knee straight    Hip Extension  2 sets;10 reps;Knee straight      Knee/Hip Exercises: Seated   Sit to Sand  10 reps;2 sets   with emphasis on eccentric control  PT Short Term Goals - 01/18/19 1029      PT SHORT TERM GOAL #1   Title  pt to be I with inital HEP    Time  3    Period  Weeks    Status  Achieved      PT SHORT TERM GOAL #2   Title  pt to verbalize and demo proper posture and lifting mechanics to reduce and prevent low back pain    Time  3    Period  Weeks    Status  Achieved        PT Long Term Goals - 01/18/19 1030      PT LONG TERM GOAL #1   Title  increase Trunk mobility by >/= 10 degrees in all planes with </= 1/10 pain for functional mobility required for ADLs     Baseline  bil side bending 20 degrees with no pain    Time  6    Period  Weeks    Status  Partially Met      PT LONG TERM GOAL #2   Title  pt to be able to stand and walk >/= 45 min with report of </= 1/10 pain for fucntional endurance for community amb    Baseline  45 with 3/10 pain    Time  6    Period  Weeks    Status  Achieved      PT LONG TERM GOAL #3   Title  pt to be I with all HEP given as of last visit to maintain and progress current level of function    Baseline  independet with current HEP and progressing as able.     Period   Weeks    Status  On-going            Plan - 01/18/19 1108    Clinical Impression Statement  Darlene Mcconnell continues to make great progress with physical therapy increasing trunk mobility and knee strength, she reports no pain today and has been pain free fro 1-2 days. She is making great progress toward her goals, and is highly motivated to continue with PT to work on strengthening and endurance and maintain being pain free. progressed core strengthening using physioball and hip strengthening in standing which was provided as HEP. She would benefit from continued physical therapy to work on strength and endurance and working toward remaining goals and independent exercise.     Rehab Potential  Good    PT Frequency  1x / week    PT Duration  4 weeks    PT Treatment/Interventions  ADLs/Self Care Home Management;Cryotherapy;Electrical Stimulation;Iontophoresis 74m/ml Dexamethasone;Moist Heat;Traction;Ultrasound;Therapeutic exercise;Therapeutic activities;Patient/family education;Dry needling;Taping;Manual techniques    PT Next Visit Plan  Re-submit to MCD potential SIJ R post rotation, STW along lumbar paraspinals, add childs pose low back stretch modalities PRN (HX of MS avoid overheating)    PT Home Exercise Plan  lower trunk rotation, supine marching, hamstring stretching, posterior pelvic tilt    Consulted and Agree with Plan of Care  Patient       Patient will benefit from skilled therapeutic intervention in order to improve the following deficits and impairments:  Pain, Increased fascial restricitons, Increased muscle spasms, Postural dysfunction, Improper body mechanics, Decreased endurance, Decreased activity tolerance, Decreased range of motion, Decreased strength  Visit Diagnosis: Chronic bilateral low back pain without sciatica  Muscle spasm of back     Problem List Patient Active Problem List   Diagnosis Date Noted  .  Recurrent left knee instability 01/15/2019  . Viral  URI with cough 01/15/2019  . Insomnia 10/05/2018  . Back pain 03/15/2018  . Neck pain 10/26/2017  . Other fatigue 03/25/2017  . Low vitamin D level 03/25/2017  . Routine adult health maintenance 09/20/2016  . Change in voice 03/25/2016  . Overactive bladder 03/25/2016  . 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 Multiple sclerosis ([place]) 06/25/2015  . Numbness and tingling in right hand 06/25/2015  . Gait disturbance 06/25/2015  . High risk medication use 06/25/2015  . History of cerebral aneurysm repair 06/25/2015  . Orthostatic hypotension 03/20/2015  . Right shoulder pain 03/20/2015  . Low back pain 03/20/2015  . Concern about STD in female without diagnosis 03/05/2014  . Essential hypertension, benign 03/04/2014   Starr Lake PT, DPT, LAT, ATC  01/18/19  11:46 AM      [facility] [address] [place], [place], [address] Phone: [phone]   Fax:  [phone]  Name: Darlene Mcconnell MRN: [id number] Date of Birth: 10-17-1956

## 2019-01-22 ENCOUNTER — Other Ambulatory Visit: Payer: Self-pay | Admitting: Family Medicine

## 2019-01-22 DIAGNOSIS — S46819D Strain of other muscles, fascia and tendons at shoulder and upper arm level, unspecified arm, subsequent encounter: Secondary | ICD-10-CM

## 2019-01-23 ENCOUNTER — Ambulatory Visit: Payer: Medicaid Other | Admitting: Family Medicine

## 2019-01-25 ENCOUNTER — Ambulatory Visit: Payer: Medicaid Other | Admitting: Physical Therapy

## 2019-01-25 ENCOUNTER — Telehealth: Payer: Self-pay | Admitting: Physical Therapy

## 2019-01-25 NOTE — Telephone Encounter (Signed)
LVM regarding missed appointment. Stated when her next scheduled appointment is and if she is unable to attend to call and we can cancel or reschedule that for her.

## 2019-01-28 ENCOUNTER — Other Ambulatory Visit: Payer: Self-pay | Admitting: Family Medicine

## 2019-01-28 DIAGNOSIS — I10 Essential (primary) hypertension: Secondary | ICD-10-CM

## 2019-01-30 ENCOUNTER — Ambulatory Visit: Payer: Medicaid Other | Admitting: Family Medicine

## 2019-01-30 ENCOUNTER — Encounter: Payer: Self-pay | Admitting: Family Medicine

## 2019-01-30 VITALS — BP 126/72 | Ht 63.0 in | Wt 120.0 lb

## 2019-01-30 DIAGNOSIS — M222X1 Patellofemoral disorders, right knee: Secondary | ICD-10-CM | POA: Diagnosis not present

## 2019-01-30 DIAGNOSIS — M25561 Pain in right knee: Secondary | ICD-10-CM
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
## 2019-01-30 NOTE — Patient Instructions (Signed)
Your knee exam is reassuring. You have a knee contusion with patellofemoral syndrome. Icing 15 minutes at a time 3-4 times a day. Aleve 1-2 tabs three times a day with food for pain and inflammation. Knee brace when up and walking around. Start physical therapy for this and do home exercises on days you don't go to therapy. Follow up with me in 6 weeks. If not improving as expected would go ahead with an MRI but I'd expect this to look normal based on your exam today.

## 2019-01-31 ENCOUNTER — Encounter: Payer: Self-pay | Admitting: Family Medicine

## 2019-02-01 ENCOUNTER — Encounter: Payer: Self-pay | Admitting: Physical Therapy

## 2019-02-01 ENCOUNTER — Ambulatory Visit: Payer: Medicaid Other | Attending: Family Medicine | Admitting: Physical Therapy

## 2019-02-01 DIAGNOSIS — M545 Low back pain: Secondary | ICD-10-CM | POA: Insufficient documentation

## 2019-02-01 DIAGNOSIS — M25561 Pain in right knee: Secondary | ICD-10-CM | POA: Insufficient documentation

## 2019-02-01 DIAGNOSIS — G8929 Other chronic pain: Secondary | ICD-10-CM | POA: Insufficient documentation

## 2019-02-01 DIAGNOSIS — M62830 Muscle spasm of back: Secondary | ICD-10-CM | POA: Diagnosis not present
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
## 2019-02-07 ENCOUNTER — Encounter: Payer: Self-pay | Admitting: Physical Therapy

## 2019-02-07 ENCOUNTER — Ambulatory Visit: Payer: Medicaid Other | Admitting: Physical Therapy

## 2019-02-07 DIAGNOSIS — M62830 Muscle spasm of back: Secondary | ICD-10-CM | POA: Diagnosis not present

## 2019-02-07 DIAGNOSIS — M25561 Pain in right knee: Secondary | ICD-10-CM

## 2019-02-07 DIAGNOSIS — G8929 Other chronic pain: Secondary | ICD-10-CM

## 2019-02-07 DIAGNOSIS — M545 Low back pain: Principal | ICD-10-CM

## 2019-02-07 NOTE — Therapy (Signed)
[place], [place], [address] Phone: [phone]   Fax:  [phone]  Physical Therapy Treatment  Patient Details  Name: Darlene Mcconnell MRN: [id number] Date of Birth: 04/07/1956 Referring Provider (PT): Karlton Lemon MD (Dr Andria Frames (for back), Dr Barbaraann Barthel (R knee))   Encounter Date: 02/07/2019  PT End of Session - 02/07/19 1101    Visit Number  6    Number of Visits  13    Date for PT Re-Evaluation  03/15/19    Authorization Type  MCD ( resubmit at 4th visit)    Authorization Time Period  01/24/2019 - 02/20/2019    Authorization - Visit Number  2    Authorization - Number of Visits  4    PT Start Time  1101    PT Stop Time  1144    PT Time Calculation (min)  43 min    Activity Tolerance  Patient tolerated treatment well       Past Medical History:  Diagnosis Date  . Amyotrophic lateral sclerosis/progressive muscular atrophy ([place])   . Hypertension   . 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 Migraine   . Multiple sclerosis ([place])   . Neuromuscular disorder [facility])     Past Surgical History:  Procedure Laterality Date  . BREAST BIOPSY Right   . CRANIOTOMY  08/17/2012   Procedure: CRANIOTOMY INTRACRANIAL ANEURYSM FOR CAROTID;  Surgeon: Winfield Cunas, MD;  Location: [place] [facility];  Service: Neurosurgery;  Laterality: Left;  Craniotomy for Aneurysm Clipping    There were no vitals filed for this visit.  Subjective Assessment - 02/07/19 1101    Subjective  Pt reports she was on the floor looking under her bed and had a lot of trouble getting up due to pain in her knee ( not able to place pressure on the front of her Rt knee)     Currently in Pain?  Yes    Pain Score  5     Pain Location  Back    Pain Orientation  Right;Lower    Pain Descriptors / Indicators  Dull;Throbbing    Pain Type  Chronic pain    Pain Onset  More than a month ago    Pain Frequency  Intermittent    Aggravating Factors   not  sure it came on last night    Pain Relieving Factors  gentle movement    Pain Score  3    Pain Location  Knee    Pain Orientation  Right    Pain Descriptors / Indicators  Dull;Aching    Aggravating Factors   moving the knee in certain places    Pain Relieving Factors  wearing the brace         [facility] PT Assessment - 02/07/19 0001      Assessment   Medical Diagnosis  Acute left-sided thoracic back pain, R knee pain      AROM   Lumbar Flexion  fingers 3" from floor                   [facility] Adult PT Treatment/Exercise - 02/07/19 0001      Self-Care   Self-Care  Other Self-Care Comments    Other Self-Care Comments   foam roller for gluts, then tennis ball around SIJ in hooklying      Lumbar Exercises: Stretches   Lower Trunk Rotation  2 reps;30 seconds   with overpressure on top leg out straight  Lumbar Exercises: Aerobic   Nustep  L5x5' U/LEs      Lumbar Exercises: Supine   Isometric Hip Flexion  10 reps;5 seconds      Knee/Hip Exercises: Machines for Strengthening   Total Gym Leg Press  2x10 with 2 plates, 40#      Knee/Hip Exercises: Standing   Terminal Knee Extension  Strengthening;Right;20 reps   with ball on wall     Knee/Hip Exercises: Seated   Long Arc Quad  Strengthening;Right;2 sets;10 reps   red band   Hamstring Curl  Right;Strengthening;2 sets;10 reps   red band     Manual Therapy   Manual therapy comments  no tenderness on palpation of the Rt QL, lumbar paraspinasl             PT Education - 02/07/19 1143    Education Details  self trigger point release using a tennis ball to her SIJ and low back    Person(s) Educated  Patient    Methods  Explanation;Demonstration    Comprehension  Returned demonstration;Verbalized understanding       PT Short Term Goals - 02/07/19 1129      PT SHORT TERM GOAL #1   Title  pt to be I with inital HEP    Status  Achieved      PT SHORT TERM GOAL #2   Title  pt to verbalize and demo proper  posture and lifting mechanics to reduce and prevent low back pain    Status  Achieved        PT Long Term Goals - 02/07/19 1129      PT LONG TERM GOAL #1   Title  increase Trunk mobility by >/= 10 degrees in all planes with </= 1/10 pain for functional mobility required for ADLs       PT LONG TERM GOAL #2   Title  pt to be able to stand and walk >/= 45 min with report of </= 1/10 pain for fucntional endurance for community amb    Status  Achieved      PT LONG TERM GOAL #3   Title  pt to be I with all HEP given as of last visit to maintain and progress current level of function    Status  On-going      PT LONG TERM GOAL #4   Title  increase R knee / hip strength to >/= 4+/5 in all planes to promote patellar stability and reduce pain    Status  On-going            Plan - 02/07/19 1144    Clinical Impression Statement  Pt reports that her pain overall has improved, moves from the back to the knee.  She tolerated well with strengthening today and instruction in self trigger point releases using a tennis ball.  She left the clininc with no pain.     Rehab Potential  Good    PT Treatment/Interventions  ADLs/Self Care Home Management;Cryotherapy;Electrical Stimulation;Iontophoresis 4mg /ml Dexamethasone;Moist Heat;Traction;Ultrasound;Therapeutic exercise;Therapeutic activities;Patient/family education;Dry needling;Taping;Manual techniques;Balance training;Passive range of motion    PT Next Visit Plan  core and lower body strengthening/stabilization    Consulted and Agree with Plan of Care  Patient       Patient will benefit from skilled therapeutic intervention in order to improve the following deficits and impairments:  Pain, Increased fascial restricitons, Increased muscle spasms, Postural dysfunction, Improper body mechanics, Decreased endurance, Decreased activity tolerance, Decreased range of motion, Decreased strength  Visit Diagnosis: Chronic  bilateral low back pain without  sciatica  Muscle spasm of back  Chronic pain of right knee     Problem List Patient Active Problem List   Diagnosis Date Noted  . Recurrent left knee instability 01/15/2019  . Viral URI with cough 01/15/2019  . Insomnia 10/05/2018  . Back pain 03/15/2018  . Neck pain 10/26/2017  . Other fatigue 03/25/2017  . Low vitamin D level 03/25/2017  . Routine adult health maintenance 09/20/2016  . Change in voice 03/25/2016  . Overactive bladder 03/25/2016  . 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 Multiple sclerosis ([place]) 06/25/2015  . Numbness and tingling in right hand 06/25/2015  . Gait disturbance 06/25/2015  . High risk medication use 06/25/2015  . History of cerebral aneurysm repair 06/25/2015  . Orthostatic hypotension 03/20/2015  . Right shoulder pain 03/20/2015  . Low back pain 03/20/2015  . Concern about STD in female without diagnosis 03/05/2014  . Essential hypertension, benign 03/04/2014    Jeral Pinch PT  02/07/2019, 11:47 AM  [facility] [address] [place], [place], [address] Phone: [phone]   Fax:  [phone]  Name: BARBARANN KELLY MRN: [id number] Date of Birth: 1956/04/06

## 2019-02-15 ENCOUNTER — Ambulatory Visit: Payer: Medicaid Other | Admitting: Physical Therapy

## 2019-02-15 ENCOUNTER — Encounter: Payer: Self-pay | Admitting: Physical Therapy

## 2019-02-15 DIAGNOSIS — M62830 Muscle spasm of back: Secondary | ICD-10-CM | POA: Diagnosis not present

## 2019-02-15 DIAGNOSIS — G8929 Other chronic pain: Secondary | ICD-10-CM

## 2019-02-15 DIAGNOSIS — M25561 Pain in right knee: Secondary | ICD-10-CM

## 2019-02-15 DIAGNOSIS — M545 Low back pain: Secondary | ICD-10-CM

## 2019-02-15 NOTE — Therapy (Signed)
[place], [place], [address] Phone: [phone]   Fax:  [phone]  Physical Therapy Treatment  Patient Details  Name: Darlene Mcconnell MRN: [id number] Date of Birth: November 09, 1956 Referring Provider (PT): Karlton Lemon MD (Dr Andria Frames (for back), Dr Barbaraann Barthel (R knee))   Encounter Date: 02/15/2019  PT End of Session - 02/15/19 0845    Visit Number  7    Number of Visits  13    Date for PT Re-Evaluation  03/15/19    Authorization Type  MCD ( resubmit at 4th visit)    Authorization Time Period  01/24/2019 - 02/20/2019    Authorization - Visit Number  3    Authorization - Number of Visits  4    PT Start Time  0845    PT Stop Time  0923    PT Time Calculation (min)  38 min    Activity Tolerance  Patient tolerated treatment well    Behavior During Therapy  [facility] for tasks assessed/performed       Past Medical History:  Diagnosis Date  . Amyotrophic lateral sclerosis/progressive muscular atrophy ([place])   . Hypertension   . 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 Migraine   . Multiple sclerosis ([place])   . Neuromuscular disorder [facility])     Past Surgical History:  Procedure Laterality Date  . BREAST BIOPSY Right   . CRANIOTOMY  08/17/2012   Procedure: CRANIOTOMY INTRACRANIAL ANEURYSM FOR CAROTID;  Surgeon: Winfield Cunas, MD;  Location: [place] [facility];  Service: Neurosurgery;  Laterality: Left;  Craniotomy for Aneurysm Clipping    There were no vitals filed for this visit.  Subjective Assessment - 02/15/19 0847    Subjective  "my back is doing good, my R knee is giving more issue today at a 5-6/10"    Currently in Pain?  Yes    Pain Score  2     Pain Location  Back    Pain Orientation  Right    Pain Descriptors / Indicators  Throbbing    Pain Type  Chronic pain    Pain Onset  More than a month ago    Pain Frequency  Intermittent    Aggravating Factors   unsure of pain    Pain Score  6    Pain Location   Knee    Pain Orientation  Right    Pain Onset  More than a month ago    Pain Frequency  Intermittent    Aggravating Factors   moving                        [facility] Adult PT Treatment/Exercise - 02/15/19 0001      Lumbar Exercises: Sidelying   Hip Abduction  15 reps;Right;Left      Knee/Hip Exercises: Seated   Sit to Sand  1 set;10 reps   with ball squeeze     Knee/Hip Exercises: Supine   Short Arc Quad Sets  2 sets;20 reps   with focus on eccentrics     Manual Therapy   Manual Therapy  Other (comment);Soft tissue mobilization    Manual therapy comments  MTPR along R quads    Joint Mobilization  patellar mobs grade III in all planes    Soft tissue mobilization  IASTM along quads and patellar tendon.    Other Manual Therapy  chopat strap (trial along middle of patellar tendon  PT Education - 02/15/19 0917    Education Details  muscle anatomy and effect of quad tightness contributing to patellar tightness/ pain. benefits of chopat strap and length of wear.     Person(s) Educated  Patient    Methods  Explanation;Verbal cues    Comprehension  Verbalized understanding;Verbal cues required       PT Short Term Goals - 02/07/19 1129      PT SHORT TERM GOAL #1   Title  pt to be I with inital HEP    Status  Achieved      PT SHORT TERM GOAL #2   Title  pt to verbalize and demo proper posture and lifting mechanics to reduce and prevent low back pain    Status  Achieved        PT Long Term Goals - 02/07/19 1129      PT LONG TERM GOAL #1   Title  increase Trunk mobility by >/= 10 degrees in all planes with </= 1/10 pain for functional mobility required for ADLs       PT LONG TERM GOAL #2   Title  pt to be able to stand and walk >/= 45 min with report of </= 1/10 pain for fucntional endurance for community amb    Status  Achieved      PT LONG TERM GOAL #3   Title  pt to be I with all HEP given as of last visit to maintain and progress current  level of function    Status  On-going      PT LONG TERM GOAL #4   Title  increase R knee / hip strength to >/= 4+/5 in all planes to promote patellar stability and reduce pain    Status  On-going            Plan - 02/15/19 0909    Clinical Impression Statement  pt notes continued pain inthe R knee located at the patellar tendon. STW along the quad and patellar cho-pat strap pt reported pain dropped to 1 -2/10. continued working on quad strengthening with emphasis on eccentrics to promote remodeling. continued working on hip/ knee strengthening. plan to see pt back for 1 visit to resubmit to MCD for futher visits.     PT Treatment/Interventions  ADLs/Self Care Home Management;Cryotherapy;Electrical Stimulation;Iontophoresis 4mg /ml Dexamethasone;Moist Heat;Traction;Ultrasound;Therapeutic exercise;Therapeutic activities;Patient/family education;Dry needling;Taping;Manual techniques;Balance training;Passive range of motion    PT Next Visit Plan  core and lower body strengthening/stabilization    PT Home Exercise Plan  lower trunk rotation, supine marching, hamstring stretching, posterior pelvic tilt, quad set with ball squeeze, hip abduction in sidelying, bridge with ball squeeze    Consulted and Agree with Plan of Care  Patient       Patient will benefit from skilled therapeutic intervention in order to improve the following deficits and impairments:  Pain, Increased fascial restricitons, Increased muscle spasms, Postural dysfunction, Improper body mechanics, Decreased endurance, Decreased activity tolerance, Decreased range of motion, Decreased strength  Visit Diagnosis: Muscle spasm of back  Chronic bilateral low back pain without sciatica  Chronic pain of right knee     Problem List Patient Active Problem List   Diagnosis Date Noted  . Recurrent left knee instability 01/15/2019  . Viral URI with cough 01/15/2019  . Insomnia 10/05/2018  . Back pain 03/15/2018  . Neck pain  10/26/2017  . Other fatigue 03/25/2017  . Low vitamin D level 03/25/2017  . Routine adult health maintenance 09/20/2016  . Change in  voice 03/25/2016  . Overactive bladder 03/25/2016  . 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 Multiple sclerosis ([place]) 06/25/2015  . Numbness and tingling in right hand 06/25/2015  . Gait disturbance 06/25/2015  . High risk medication use 06/25/2015  . History of cerebral aneurysm repair 06/25/2015  . Orthostatic hypotension 03/20/2015  . Right shoulder pain 03/20/2015  . Low back pain 03/20/2015  . Concern about STD in female without diagnosis 03/05/2014  . Essential hypertension, benign 03/04/2014   Starr Lake PT, DPT, LAT, ATC  02/15/19  9:24 AM      [facility] [address] [place], [place], [address] Phone: [phone]   Fax:  [phone]  Name: Darlene Mcconnell MRN: [id number] Date of Birth: 07/04/1956

## 2019-02-19 ENCOUNTER — Ambulatory Visit: Payer: Medicaid Other | Admitting: Physical Therapy

## 2019-02-19 ENCOUNTER — Encounter: Payer: Self-pay | Admitting: Physical Therapy

## 2019-02-19 DIAGNOSIS — M62830 Muscle spasm of back: Secondary | ICD-10-CM

## 2019-02-19 DIAGNOSIS — G8929 Other chronic pain: Secondary | ICD-10-CM | POA: Diagnosis not present

## 2019-02-19 DIAGNOSIS — M545 Low back pain: Secondary | ICD-10-CM

## 2019-02-19 DIAGNOSIS — M25561 Pain in right knee: Secondary | ICD-10-CM | POA: Diagnosis not present

## 2019-02-19 NOTE — Therapy (Addendum)
[place], [place], [address] Phone: [phone]   Fax:  [phone]  Physical Therapy Treatment  Patient Details  Name: Darlene Mcconnell MRN: [id number] Date of Birth: November 12, 1956 Referring Provider (PT): Karlton Lemon MD (Dr Andria Frames (for back), Dr Barbaraann Barthel (R knee))   Encounter Date: 02/19/2019  PT End of Session - 02/19/19 1058    Visit Number  8    Number of Visits  13    Date for PT Re-Evaluation  03/15/19    Authorization Type  MCD ( resubmit at 4th visit)    Authorization Time Period  01/24/2019 - 02/20/2019    Authorization - Visit Number  4    Authorization - Number of Visits  4    PT Start Time  1018    PT Stop Time  1100    PT Time Calculation (min)  42 min    Activity Tolerance  Patient tolerated treatment well    Behavior During Therapy  [facility] for tasks assessed/performed       Past Medical History:  Diagnosis Date  . Amyotrophic lateral sclerosis/progressive muscular atrophy ([place])   . Hypertension   . 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 Migraine   . Multiple sclerosis ([place])   . Neuromuscular disorder [facility])     Past Surgical History:  Procedure Laterality Date  . BREAST BIOPSY Right   . CRANIOTOMY  08/17/2012   Procedure: CRANIOTOMY INTRACRANIAL ANEURYSM FOR CAROTID;  Surgeon: Winfield Cunas, MD;  Location: [place] [facility];  Service: Neurosurgery;  Laterality: Left;  Craniotomy for Aneurysm Clipping    There were no vitals filed for this visit.  Subjective Assessment - 02/19/19 1023    Subjective  PT has helped  .  my pain is  a lot better.  I am sleeping better. I can go 3-4 days without pain then it comes.     Currently in Pain?  Yes    Pain Score  --   just a little under the knee.    Pain Location  Knee    Pain Orientation  Right    Pain Descriptors / Indicators  Aching    Pain Type  Chronic pain    Pain Frequency  Intermittent    Aggravating Factors   weather changes    Pain Relieving Factors  gentle movement    Pain Score  0   just a little  up to 5/10   Pain Location  Neck    Pain Orientation  Right    Pain Descriptors / Indicators  --   pulling   Pain Radiating Towards  right shoulder    Pain Frequency  Intermittent    Aggravating Factors   not sure    Pain Relieving Factors  stretches         [facility] PT Assessment - 02/19/19 0001      Strength   Right Hip Flexion  4+/5    Right Hip Extension  5/5    Right Hip ABduction  5/5    Right Hip ADduction  4/5   within available range   Right Knee Flexion  5/5   no pain   Right Knee Extension  4+/5                   [facility] Adult PT Treatment/Exercise - 02/19/19 0001      Self-Care   Self-Care  ADL's    Other Self-Care Comments   modified squat  5 with   resting elbow on thighs.        Knee/Hip Exercises: Stretches   Sports administrator  3 reps;30 seconds    Quad Stretch Limitations  off edge of mat  ROM limited.  strumming of quads  lightly to assist lengthening      Knee/Hip Exercises: Supine   Short Arc Quad Sets  Right   with ball squeeze,  3 sets eccentric focus.    Other Supine Knee/Hip Exercises  10 X green band hamstring curl supine 10 X,  challanging.               PT Education - 02/19/19 1037    Education Details  ADL.      Person(s) Educated  Patient    Methods  Explanation;Demonstration;Verbal cues;Handout    Comprehension  Verbalized understanding       PT Short Term Goals - 02/07/19 1129      PT SHORT TERM GOAL #1   Title  pt to be I with inital HEP    Status  Achieved      PT SHORT TERM GOAL #2   Title  pt to verbalize and demo proper posture and lifting mechanics to reduce and prevent low back pain    Status  Achieved        PT Long Term Goals - 02/19/19 1059      PT LONG TERM GOAL #1   Title  increase Trunk mobility by >/= 10 degrees in all planes with </= 1/10 pain for functional mobility required for ADLs     Baseline  not formally measured  however it is easier to donning shoes and socks.     Time  6    Period  Weeks    Status  Partially Met      PT LONG TERM GOAL #2   Title  pt to be able to stand and walk >/= 45 min with report of </= 1/10 pain for fucntional endurance for community amb    Baseline  prevoiusly met    Time  6    Period  Weeks    Status  Achieved      PT LONG TERM GOAL #3   Title  pt to be I with all HEP given as of last visit to maintain and progress current level of function    Baseline  independet with current HEP and progressing as able.     Time  6    Period  Weeks    Status  On-going      PT LONG TERM GOAL #4   Title  increase R knee / hip strength to >/= 4+/5 in all planes to promote patellar stability and reduce pain    Baseline  overall  hip and knee strength 4+/5 to 5/5 Right    Time  6    Period  Weeks    Status  Achieved            Plan - 02/19/19 1317    Clinical Impression Statement  Darlene Mcconnell is making good progress toward her goals.  LTG#4 met.  She has been consistant with the HEP and this has helped pain and strength.  Pain in quads continues intermittantly.  This was adresses with exercise (including  eccentrics ) .  ADL education introduced today  to address back pain.   She was able to demonstrate modified techniques for squatting with less pain.     PT Frequency  1x / week  PT Duration  4 weeks    PT Treatment/Interventions  ADLs/Self Care Home Management;Cryotherapy;Electrical Stimulation;Iontophoresis 23m/ml Dexamethasone;Moist Heat;Traction;Ultrasound;Therapeutic exercise;Therapeutic activities;Patient/family education;Dry needling;Taping;Manual techniques;Balance training;Passive range of motion    PT Next Visit Plan  core and lower body strengthening/stabilization.  Answer any ADL questions.      PT Home Exercise Plan  lower trunk rotation, supine marching, hamstring stretching, posterior pelvic tilt, quad set with ball squeeze, hip abduction in sidelying, bridge with  ball squeeze    Consulted and Agree with Plan of Care  Patient       Patient will benefit from skilled therapeutic intervention in order to improve the following deficits and impairments:     Visit Diagnosis: Muscle spasm of back  Chronic bilateral low back pain without sciatica  Chronic pain of right knee     Problem List Patient Active Problem List   Diagnosis Date Noted  . Recurrent left knee instability 01/15/2019  . Viral URI with cough 01/15/2019  . Insomnia 10/05/2018  . Back pain 03/15/2018  . Neck pain 10/26/2017  . Other fatigue 03/25/2017  . Low vitamin D level 03/25/2017  . Routine adult health maintenance 09/20/2016  . Change in voice 03/25/2016  . Overactive bladder 03/25/2016  . 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 Multiple sclerosis ([place] 06/25/2015  . Numbness and tingling in right hand 06/25/2015  . Gait disturbance 06/25/2015  . High risk medication use 06/25/2015  . History of cerebral aneurysm repair 06/25/2015  . Orthostatic hypotension 03/20/2015  . Right shoulder pain 03/20/2015  . Low back pain 03/20/2015  . Concern about STD in female without diagnosis 03/05/2014  . Essential hypertension, benign 03/04/2014    Darlene Mcconnell  PTA 02/19/2019, 1:26 PM  [facility] [place] [address]Phone: [phone]  Fax:  [phone] Name: Darlene FIFIELDMRN: [id number] of Birth: 305-10-1956   KStarr LakePT, DPT, LAT, ATC  02/19/19  8:52 PM

## 2019-02-19 NOTE — Patient Instructions (Addendum)

## 2019-02-28 ENCOUNTER — Encounter: Payer: Self-pay | Admitting: Physical Therapy

## 2019-02-28 ENCOUNTER — Ambulatory Visit: Payer: Medicaid Other | Attending: Family Medicine | Admitting: Physical Therapy

## 2019-02-28 DIAGNOSIS — M62830 Muscle spasm of back: Secondary | ICD-10-CM | POA: Insufficient documentation

## 2019-02-28 DIAGNOSIS — G8929 Other chronic pain: Secondary | ICD-10-CM | POA: Insufficient documentation

## 2019-02-28 DIAGNOSIS — M25561 Pain in right knee: Secondary | ICD-10-CM | POA: Diagnosis not present

## 2019-02-28 DIAGNOSIS — M545 Low back pain, unspecified: Secondary | ICD-10-CM

## 2019-02-28 NOTE — Therapy (Signed)
[place], [place], [address] Phone: [phone]   Fax:  [phone]  Physical Therapy Treatment  Patient Details  Name: Darlene Mcconnell MRN: [id number] Date of Birth: [birth year] Referring Provider (PT): Karlton Lemon MD (Dr Andria Frames (for back), Dr Barbaraann Barthel (R knee))   Encounter Date: 02/28/2019  PT End of Session - 02/28/19 0844    Visit Number  9    Number of Visits  13    Date for PT Re-Evaluation  03/15/19    Authorization Type  MCD     Authorization Time Period  02/26/2019 - 03/25/2019    Authorization - Visit Number  1    Authorization - Number of Visits  4    PT Start Time  6629    PT Stop Time  0915   pt stated she had to leave 15 min early   PT Time Calculation (min)  29 min    Activity Tolerance  Patient tolerated treatment well    Behavior During Therapy  [facility] for tasks assessed/performed       Past Medical History:  Diagnosis Date  . Amyotrophic lateral sclerosis/progressive muscular atrophy ([place])   . Hypertension   . 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 Migraine   . Multiple sclerosis ([place])   . Neuromuscular disorder [facility])     Past Surgical History:  Procedure Laterality Date  . BREAST BIOPSY Right   . CRANIOTOMY  08/17/2012   Procedure: CRANIOTOMY INTRACRANIAL ANEURYSM FOR CAROTID;  Surgeon: Winfield Cunas, MD;  Location: [facility];  Service: Neurosurgery;  Laterality: Left;  Craniotomy for Aneurysm Clipping    There were no vitals filed for this visit.  Subjective Assessment - 02/28/19 0846    Subjective  "pt reports she is doing pretty good. Low back is still alittle bit sore, and the knee is as bad"     Patient Stated Goals  to get back to normal,    Currently in Pain?  Yes    Pain Score  5     Pain Location  Knee    Pain Orientation  Right    Pain Descriptors / Indicators  Aching    Pain Type  Chronic pain    Pain Onset  More than a month ago    Pain Frequency   Intermittent    Aggravating Factors   direct pressure    Pain Relieving Factors  gentle movemeent    Pain Score  4    Pain Location  Back    Pain Orientation  Right    Pain Descriptors / Indicators  Aching    Pain Type  Chronic pain    Aggravating Factors   standing/ walking    Pain Relieving Factors  stretch                       [facility] Adult PT Treatment/Exercise - 02/28/19 0001      Lumbar Exercises: Stretches   Other Lumbar Stretch Exercise  low back stretch walking hands out on physioball 2 x 30 sec      Lumbar Exercises: Aerobic   Nustep  L 6 x 5 min UE/Le      Lumbar Exercises: Seated   Other Seated Lumbar Exercises  seated on green physioball pelvic tilt and marching 2 x 10 ea.      Knee/Hip Exercises: Stretches   Active Hamstring Stretch  2 reps;30 seconds   contract/ relax with  10 sec contraction   Gastroc Stretch  2 reps;Both;30 seconds   slant board     Knee/Hip Exercises: Standing   Heel Raises  2 sets;15 reps   with bil inversion to focus in posterior tib   Heel Raises Limitations  with bil UE HHA holing on green physioball on table to promote core activation    Hip Abduction  Both;1 set;15 reps;Knee straight    Abduction Limitations  with bil UE HHA holing on green physioball on table to promote core activation    Wall Squat  10 reps;2 sets   with ball between the knees to promote alignment     Knee/Hip Exercises: Seated   Long Arc Quad  2 sets;10 reps;Strengthening;Right   with ball squeeze between the knees   Long Arc Quad Limitations  seated on green physioball to promote core activation               PT Short Term Goals - 02/07/19 1129      PT SHORT TERM GOAL #1   Title  pt to be I with inital HEP    Status  Achieved      PT SHORT TERM GOAL #2   Title  pt to verbalize and demo proper posture and lifting mechanics to reduce and prevent low back pain    Status  Achieved        PT Long Term Goals - 02/19/19 1059       PT LONG TERM GOAL #1   Title  increase Trunk mobility by >/= 10 degrees in all planes with </= 1/10 pain for functional mobility required for ADLs     Baseline  not formally measured however it is easier to donning shoes and socks.     Time  6    Period  Weeks    Status  Partially Met      PT LONG TERM GOAL #2   Title  pt to be able to stand and walk >/= 45 min with report of </= 1/10 pain for fucntional endurance for community amb    Baseline  prevoiusly met    Time  6    Period  Weeks    Status  Achieved      PT LONG TERM GOAL #3   Title  pt to be I with all HEP given as of last visit to maintain and progress current level of function    Baseline  independet with current HEP and progressing as able.     Time  6    Period  Weeks    Status  On-going      PT LONG TERM GOAL #4   Title  increase R knee / hip strength to >/= 4+/5 in all planes to promote patellar stability and reduce pain    Baseline  overall  hip and knee strength 4+/5 to 5/5 Right    Time  6    Period  Weeks    Status  Achieved            Plan - 02/28/19 0903    Clinical Impression Statement  pt reported she had to leave early today. she notes she is doing better and but still has alittle soreness inthe knees. foucsed on core and hip strengthening in seated and standing using physioball. she reported feeling better at end of the session.     PT Next Visit Plan  core and lower body strengthening/stabilization.  Answer any ADL questions.  PT Home Exercise Plan  lower trunk rotation, supine marching, hamstring stretching, posterior pelvic tilt, quad set with ball squeeze, hip abduction in sidelying, bridge with ball squeeze    Consulted and Agree with Plan of Care  Patient       Patient will benefit from skilled therapeutic intervention in order to improve the following deficits and impairments:  Pain, Increased fascial restricitons, Increased muscle spasms, Postural dysfunction, Improper body mechanics,  Decreased endurance, Decreased activity tolerance, Decreased range of motion, Decreased strength  Visit Diagnosis: Muscle spasm of back  Chronic bilateral low back pain without sciatica  Chronic pain of right knee     Problem List Patient Active Problem List   Diagnosis Date Noted  . Recurrent left knee instability 01/15/2019  . Viral URI with cough 01/15/2019  . Insomnia 10/05/2018  . Back pain 03/15/2018  . Neck pain 10/26/2017  . Other fatigue 03/25/2017  . Low vitamin D level 03/25/2017  . Routine adult health maintenance 09/20/2016  . Change in voice 03/25/2016  . Overactive bladder 03/25/2016  . 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 Multiple sclerosis ([place]) 06/25/2015  . Numbness and tingling in right hand 06/25/2015  . Gait disturbance 06/25/2015  . High risk medication use 06/25/2015  . History of cerebral aneurysm repair 06/25/2015  . Orthostatic hypotension 03/20/2015  . Right shoulder pain 03/20/2015  . Low back pain 03/20/2015  . Concern about STD in female without diagnosis 03/05/2014  . Essential hypertension, benign 03/04/2014   Starr Lake PT, DPT, LAT, ATC  02/28/19  9:18 AM      [facility] [address] [place], [place], [address] Phone: [phone]   Fax:  [phone]  Name: CHERITY BLICKENSTAFF MRN: [id number] Date of Birth: 11-20-1956

## 2019-03-05 ENCOUNTER — Other Ambulatory Visit: Payer: Self-pay | Admitting: *Deleted

## 2019-03-05 DIAGNOSIS — B351 Tinea unguium: Secondary | ICD-10-CM

## 2019-03-07 ENCOUNTER — Other Ambulatory Visit: Payer: Self-pay

## 2019-03-07 ENCOUNTER — Encounter: Payer: Self-pay | Admitting: Physical Therapy

## 2019-03-07 ENCOUNTER — Ambulatory Visit: Payer: Medicaid Other | Admitting: Physical Therapy

## 2019-03-07 DIAGNOSIS — M25561 Pain in right knee: Secondary | ICD-10-CM | POA: Diagnosis not present

## 2019-03-07 DIAGNOSIS — G8929 Other chronic pain: Secondary | ICD-10-CM | POA: Diagnosis not present

## 2019-03-07 DIAGNOSIS — M62830 Muscle spasm of back: Secondary | ICD-10-CM

## 2019-03-07 DIAGNOSIS — M545 Low back pain: Secondary | ICD-10-CM

## 2019-03-07 MED ORDER — TERBINAFINE HCL 250 MG PO TABS: 250.0000 mg | ORAL_TABLET | Freq: Every day | ORAL | 2 refills | Status: DC

## 2019-03-07 NOTE — Therapy (Signed)
[place], [place], [address] Phone: [phone]   Fax:  [phone]  Physical Therapy Treatment  Patient Details  Name: Darlene Mcconnell MRN: [id number] Date of Birth: August 18, 1956 Referring Provider (PT): Karlton Lemon MD (Dr Andria Frames (for back), Dr Barbaraann Barthel (R knee))   Encounter Date: 03/07/2019  PT End of Session - 03/07/19 0847    Visit Number  10    Number of Visits  13    Date for PT Re-Evaluation  03/15/19    Authorization Type  MCD     Authorization Time Period  02/26/2019 - 03/25/2019    Authorization - Visit Number  2    Authorization - Number of Visits  4    PT Start Time  0847    PT Stop Time  0928    PT Time Calculation (min)  41 min    Activity Tolerance  Patient tolerated treatment well    Behavior During Therapy  [facility] for tasks assessed/performed       Past Medical History:  Diagnosis Date  . Amyotrophic lateral sclerosis/progressive muscular atrophy ([place])   . Hypertension   . 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 Migraine   . Multiple sclerosis ([place])   . Neuromuscular disorder [facility]     Past Surgical History:  Procedure Laterality Date  . BREAST BIOPSY Right   . CRANIOTOMY  08/17/2012   Procedure: CRANIOTOMY INTRACRANIAL ANEURYSM FOR CAROTID;  Surgeon: Winfield Cunas, MD;  Location: [facility];  Service: Neurosurgery;  Laterality: Left;  Craniotomy for Aneurysm Clipping    There were no vitals filed for this visit.  Subjective Assessment - 03/07/19 0849    Subjective  "my back is bothering me which started yesterday with, it might be due to me lifting 2 boxes 2 days ago.     Patient Stated Goals  to get back to normal,    Currently in Pain?  Yes    Pain Score  0-No pain    Pain Orientation  Right    Pain Type  Chronic pain    Pain Onset  More than a month ago    Pain Frequency  Intermittent    Aggravating Factors   standing/ walking     Pain Score  6    Pain Location   Back    Pain Orientation  Right    Pain Type  Chronic pain    Pain Onset  More than a month ago    Pain Frequency  Intermittent    Aggravating Factors   standing/ walking, moving boxes moving around    Pain Relieving Factors  stretching                       [facility] Adult PT Treatment/Exercise - 03/07/19 0859      Self-Care   Other Self-Care Comments   MTPR techniques and how to perform at home to relief low back muscle tension      Therapeutic Activites    Therapeutic Activities  Lifting    Lifting  proper lifting mechanis with 12# box from floor to waist and placing weigh ton the shelf    demonstration and intermittent cues for proper form.      Lumbar Exercises: Stretches   Other Lumbar Stretch Exercise  low back stretch walking hands out on physioball 2 x 30 sec      Lumbar Exercises: Aerobic   Nustep  L5 x 5  min  UE/LE      Knee/Hip Exercises: Seated   Sit to Sand  2 sets;10 reps;without UE support   with green band around knees            PT Education - 03/07/19 0905    Education Details  MTPR release techniques using a tennis ball. proper lifting mechancis focusing on using the legs versus the back.        PT Short Term Goals - 02/07/19 1129      PT SHORT TERM GOAL #1   Title  pt to be I with inital HEP    Status  Achieved      PT SHORT TERM GOAL #2   Title  pt to verbalize and demo proper posture and lifting mechanics to reduce and prevent low back pain    Status  Achieved        PT Long Term Goals - 02/19/19 1059      PT LONG TERM GOAL #1   Title  increase Trunk mobility by >/= 10 degrees in all planes with </= 1/10 pain for functional mobility required for ADLs     Baseline  not formally measured however it is easier to donning shoes and socks.     Time  6    Period  Weeks    Status  Partially Met      PT LONG TERM GOAL #2   Title  pt to be able to stand and walk >/= 45 min with report of </= 1/10 pain for fucntional endurance  for community amb    Baseline  prevoiusly met    Time  6    Period  Weeks    Status  Achieved      PT LONG TERM GOAL #3   Title  pt to be I with all HEP given as of last visit to maintain and progress current level of function    Baseline  independet with current HEP and progressing as able.     Time  6    Period  Weeks    Status  On-going      PT LONG TERM GOAL #4   Title  increase R knee / hip strength to >/= 4+/5 in all planes to promote patellar stability and reduce pain    Baseline  overall  hip and knee strength 4+/5 to 5/5 Right    Time  6    Period  Weeks    Status  Achieved            Plan - 03/07/19 8466    Clinical Impression Statement  pt rpeorted increased soreness in the back which is likely related to improper lifting mechanics at home. continued STW utilzing MTPR techniques and how it can be done at home and focused on lifting mechanics which she required demonstration and intermittent verbal cues for proper form. end of session she noted no pain in the back or knee.     PT Treatment/Interventions  ADLs/Self Care Home Management;Cryotherapy;Electrical Stimulation;Iontophoresis 49m/ml Dexamethasone;Moist Heat;Traction;Ultrasound;Therapeutic exercise;Therapeutic activities;Patient/family education;Dry needling;Taping;Manual techniques;Balance training;Passive range of motion    PT Next Visit Plan  core and lower body strengthening/stabilization.  Answer any ADL questions.  review lifting mechanics.    PT Home Exercise Plan  lower trunk rotation, supine marching, hamstring stretching, posterior pelvic tilt, quad set with ball squeeze, hip abduction in sidelying, bridge with ball squeeze, lifting mechanics    Consulted and Agree with Plan of Care  Patient  Patient will benefit from skilled therapeutic intervention in order to improve the following deficits and impairments:  Pain, Increased fascial restricitons, Increased muscle spasms, Postural dysfunction,  Improper body mechanics, Decreased endurance, Decreased activity tolerance, Decreased range of motion, Decreased strength  Visit Diagnosis: Muscle spasm of back  Chronic bilateral low back pain without sciatica  Chronic pain of right knee     Problem List Patient Active Problem List   Diagnosis Date Noted  . Recurrent left knee instability 01/15/2019  . Viral URI with cough 01/15/2019  . Insomnia 10/05/2018  . Back pain 03/15/2018  . Neck pain 10/26/2017  . Other fatigue 03/25/2017  . Low vitamin D level 03/25/2017  . Routine adult health maintenance 09/20/2016  . Change in voice 03/25/2016  . Overactive bladder 03/25/2016  . 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 Multiple sclerosis ([place]) 06/25/2015  . Numbness and tingling in right hand 06/25/2015  . Gait disturbance 06/25/2015  . High risk medication use 06/25/2015  . History of cerebral aneurysm repair 06/25/2015  . Orthostatic hypotension 03/20/2015  . Right shoulder pain 03/20/2015  . Low back pain 03/20/2015  . Concern about STD in female without diagnosis 03/05/2014  . Essential hypertension, benign 03/04/2014   Starr Lake PT, DPT, LAT, ATC  03/07/19  9:30 AM      [facility] [address] [place], [place], [address] Phone: [phone]   Fax:  [phone]  Name: Darlene Mcconnell MRN: [id number] Date of Birth: 01-25-1956

## 2019-03-13 ENCOUNTER — Encounter: Payer: Self-pay | Admitting: Family Medicine

## 2019-03-13 ENCOUNTER — Other Ambulatory Visit: Payer: Self-pay

## 2019-03-13 ENCOUNTER — Ambulatory Visit (INDEPENDENT_AMBULATORY_CARE_PROVIDER_SITE_OTHER): Payer: Medicaid Other | Admitting: Family Medicine

## 2019-03-13 VITALS — BP 124/64 | Temp 98.4°F | Ht 63.0 in | Wt 120.0 lb

## 2019-03-13 DIAGNOSIS — M25561 Pain in right knee: Secondary | ICD-10-CM | POA: Diagnosis not present

## 2019-03-13 NOTE — Patient Instructions (Signed)
Follow up with me as needed.
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
## 2019-03-14 ENCOUNTER — Ambulatory Visit: Payer: Medicaid Other | Admitting: Physical Therapy

## 2019-03-14 ENCOUNTER — Telehealth: Payer: Self-pay | Admitting: Physical Therapy

## 2019-03-14 NOTE — Telephone Encounter (Signed)
Spoke with pt regarding missed appointment today. She reported that her ride wasn't able to bring her so she wasn't able to make it. I discussed that if she cannot make the appointment she can call we can cancel or reschedule that appointment for her. Provided the clinic number in the event she would like to reschedule.

## 2019-03-21 ENCOUNTER — Ambulatory Visit: Payer: Medicaid Other | Admitting: Physical Therapy

## 2019-03-26 ENCOUNTER — Ambulatory Visit: Payer: Medicaid Other | Admitting: Physical Therapy

## 2019-04-04 ENCOUNTER — Telehealth: Payer: Self-pay | Admitting: Physical Therapy

## 2019-04-04 NOTE — Telephone Encounter (Signed)
Darlene Mcconnell was contacted today regarding the temporary reduction of OP Rehab Services due to concerns for community transmission of Covid-19.    Therapist advised the patient to continue to perform their HEP and assured they had no unanswered questions at this time.  The patient expressed interest in being contacted for an e-visit, virtual check in, or telehealth visit to continue their POC care, when those services become available.     Outpatient Rehabilitation Services will follow up with patients at that time.

## 2019-04-05 ENCOUNTER — Telehealth: Payer: Self-pay | Admitting: *Deleted

## 2019-04-05 NOTE — Telephone Encounter (Signed)
I called pt and updated her med list/allergies/pharmacy on file for her virtual visit 04/09/19 with Dr. Felecia Shelling.

## 2019-04-09 ENCOUNTER — Other Ambulatory Visit: Payer: Self-pay

## 2019-04-09 ENCOUNTER — Ambulatory Visit (INDEPENDENT_AMBULATORY_CARE_PROVIDER_SITE_OTHER): Payer: Medicaid Other | Admitting: Neurology

## 2019-04-09 DIAGNOSIS — G35 Multiple sclerosis: Secondary | ICD-10-CM

## 2019-04-09 NOTE — Progress Notes (Signed)
Schedule as a virtual visit but was unable to connect.  Patient not available by telephone.

## 2019-04-18 ENCOUNTER — Telehealth: Payer: Self-pay | Admitting: Physical Therapy

## 2019-04-18 NOTE — Telephone Encounter (Signed)
Darlene Mcconnell was contacted today regarding transition if in-person OP Rehab Services to telehealth due to Covid-19. Pt consented to telehealth services, educated on MyChart signup, [employer], and was agreeable to receive information via (text/email) regarding telehealth services. Pt consented and was scheduled for appointment. An appointment was scheduled for Thursday, April 23rd.

## 2019-04-19 ENCOUNTER — Ambulatory Visit: Payer: Medicaid Other | Attending: Family Medicine | Admitting: Physical Therapy

## 2019-04-19 ENCOUNTER — Telehealth: Payer: Self-pay | Admitting: Physical Therapy

## 2019-04-19 ENCOUNTER — Encounter: Payer: Self-pay | Admitting: Physical Therapy

## 2019-04-19 DIAGNOSIS — M62830 Muscle spasm of back: Secondary | ICD-10-CM | POA: Diagnosis not present

## 2019-04-19 DIAGNOSIS — G8929 Other chronic pain: Secondary | ICD-10-CM | POA: Diagnosis not present

## 2019-04-19 DIAGNOSIS — M25561 Pain in right knee: Secondary | ICD-10-CM | POA: Insufficient documentation

## 2019-04-19 DIAGNOSIS — M545 Low back pain, unspecified: Secondary | ICD-10-CM

## 2019-04-19 NOTE — Telephone Encounter (Signed)
Returned pt's call regarding her inquiry about today's telehealth visit due to being confused if she was done with PT.    LVM regarding how things had gone on the previous face to face visit's and based on the documentation from the telehealth visit on 4/23 that she was doing well and would no longer require physical therapy and be  discharged.   If she had any other questions or concerns she is welcome to call us back.

## 2019-04-19 NOTE — Patient Instructions (Signed)
Access Code: [email or web address]  URL: [email or web address]  Date: 04/19/2019  Prepared by: Raeford Razor   Exercises  Seated Piriformis Stretch with Trunk Bend - 3 reps - 1 sets - 30 hold - 2x daily - 7x weekly  Seated Hamstring Stretch - 3 reps - 1 sets - 30 hold - 2x daily - 7x weekly

## 2019-04-19 NOTE — Telephone Encounter (Signed)
VIRTUAL VISIT 6 mos 04-19-2019 Pt has called and she has given verbal consent for a VV to be filed to insurance. Pt email address:[email or web address]  pt asked to download [employer] webex app/program [email or web address]

## 2019-04-19 NOTE — Therapy (Signed)
[place], [place], [address] Phone: [phone]   Fax:  [phone]  Physical Therapy Treatment/Re-Evaluation/Discharge   Patient Details  Name: Darlene Mcconnell MRN: [id number] Date of Birth: 1956/06/04 Referring Provider (PT): Dr. Karlton Lemon and Dr. Andria Frames    Encounter Date: 04/19/2019  PT End of Session - 04/19/19 1157    Visit Number  11    Authorization Type  MCD     Authorization Time Period  02/26/2019 - 03/25/2019, this visit was ERO     PT Start Time  1125    PT Stop Time  1205    PT Time Calculation (min)  40 min    Activity Tolerance  Patient tolerated treatment well    Behavior During Therapy  [facility] for tasks assessed/performed       Past Medical History:  Diagnosis Date  . Amyotrophic lateral sclerosis/progressive muscular atrophy ([place])   . Hypertension   . 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 Migraine   . Multiple sclerosis ([place])   . Neuromuscular disorder [facility])     Past Surgical History:  Procedure Laterality Date  . BREAST BIOPSY Right   . CRANIOTOMY  08/17/2012   Procedure: CRANIOTOMY INTRACRANIAL ANEURYSM FOR CAROTID;  Surgeon: Winfield Cunas, MD;  Location: [place] [facility];  Service: Neurosurgery;  Laterality: Left;  Craniotomy for Aneurysm Clipping    There were no vitals filed for this visit.  Subjective Assessment - 04/19/19 1131    Subjective  I have been feeling pretty good overall.  My knee is better.  My back is better.   I have some Rt sided neck pain and tension.      How long can you walk comfortably?  goes walking for 45-60 min     Currently in Pain?  Yes    Pain Score  2     Pain Location  Neck    Pain Orientation  Right    Pain Descriptors / Indicators  Discomfort    Pain Type  Chronic pain    Pain Onset  More than a month ago    Pain Frequency  Once a week    Pain Score  0    Pain Location  Back   and knee 0/10        [facility] PT Assessment - 04/19/19  0001      Assessment   Medical Diagnosis  back pain, knee pain     Referring Provider (PT)  Dr. Karlton Lemon and Dr. Andria Frames     Onset Date/Surgical Date  11/29/18    Hand Dominance  Right    Prior Therapy  Yes       Precautions   Precautions  None      Restrictions   Weight Bearing Restrictions  No      Balance Screen   Has the patient fallen in the past 6 months  No      [place] residence    Living Arrangements  Non-relatives/Friends    Available Help at Discharge  Available PRN/intermittently    Type of [place] to enter    Entrance Stairs-Number of Steps  1    Entrance Stairs-Rails  None    Home Layout  One level    Home Equipment  None      Prior Function   Level of Independence  Independent  Vocation  Unemployed      Posture/Postural Control   Posture/Postural Control  Postural limitations    Postural Limitations  Rounded Shoulders;Forward head;Increased lumbar lordosis      AROM   Overall AROM Comments  no knee pain with seated flexion    Lumbar Flexion  WNL    Lumbar Extension  25%    Lumbar - Right Side Bend  WNL    Lumbar - Left Side Bend  WNL    Lumbar - Right Rotation  WNL    Lumbar - Left Rotation  WNL      Strength   Overall Strength Comments  WFL , gets up and down from floor without pain           [facility] Adult PT Treatment/Exercise - 04/19/19 0001      Self-Care   Self-Care  ADL's;Lifting;Posture;Other Self-Care Comments    Lifting  proper form     Other Self-Care Comments   HEP and techniques, review, DC       Lumbar Exercises: Stretches   Lower Trunk Rotation  10 seconds    Lower Trunk Rotation Limitations  x 10     Pelvic Tilt  10 reps      Knee/Hip Exercises: Stretches   Active Hamstring Stretch  Both;3 reps      Knee/Hip Exercises: Standing   Functional Squat  1 set;5 reps    Functional Squat Limitations  to check technique      Knee/Hip Exercises: Supine    Bridges  Strengthening;Both;1 set;10 reps      Knee/Hip Exercises: Sidelying   Hip ABduction  Strengthening;Both;1 set;10 reps      Neck Exercises: Stretches   Upper Trapezius Stretch  2 reps;30 seconds    Levator Stretch  2 reps;30 seconds             PT Education - 04/19/19 1156    Education Details  HEP, final questions  about PT, DC     Person(s) Educated  Patient    Methods  Explanation;Verbal cues;Handout    Comprehension  Verbalized understanding;Returned demonstration       PT Short Term Goals - 04/19/19 1157      PT SHORT TERM GOAL #1   Title  pt to be I with inital HEP    Status  Achieved      PT SHORT TERM GOAL #2   Title  pt to verbalize and demo proper posture and lifting mechanics to reduce and prevent low back pain    Status  Achieved        PT Long Term Goals - 04/19/19 1158      PT LONG TERM GOAL #1   Title  increase Trunk mobility by >/= 10 degrees in all planes with </= 1/10 pain for functional mobility required for ADLs     Status  Achieved      PT LONG TERM GOAL #2   Title  pt to be able to stand and walk >/= 45 min with report of </= 1/10 pain for fucntional endurance for community amb    Status  Achieved      PT LONG TERM GOAL #3   Title  pt to be I with all HEP given as of last visit to maintain and progress current level of function    Status  Achieved      PT LONG TERM GOAL #4   Title  increase R knee / hip strength to >/= 4+/5 in all  planes to promote patellar stability and reduce pain    Baseline  unable to test in person due to Telehealth visit , but met as of last visit in clinic    Status  Achieved            Plan - 04/19/19 1220    Clinical Impression Statement  Patient seen for Telehealth visit.  She reports resolution of symptoms unless she does lift something improperly.  She had no complaints of pain in her low back today, just some mild neck stiffness.  We reviewed HEP and principles of lifting going forward.   She does not need to be seen via Telehealth or in clinic unless  new symptoms arise or flare up.  DC from PT.     PT Next Visit Plan  DC    PT Home Exercise Plan  piriformis stretch, lower trunk rotation, supine marching, hamstring stretching, posterior pelvic tilt, quad set with ball squeeze, hip abduction in sidelying, bridge with ball squeeze, lifting mechanics    Consulted and Agree with Plan of Care  Patient       Patient will benefit from skilled therapeutic intervention in order to improve the following deficits and impairments:  Pain, Increased fascial restricitons, Increased muscle spasms, Postural dysfunction, Improper body mechanics, Decreased endurance, Decreased activity tolerance, Decreased range of motion, Decreased strength  Visit Diagnosis: Muscle spasm of back  Chronic bilateral low back pain without sciatica  Chronic pain of right knee     Problem List Patient Active Problem List   Diagnosis Date Noted  . Recurrent left knee instability 01/15/2019  . Viral URI with cough 01/15/2019  . Insomnia 10/05/2018  . Back pain 03/15/2018  . Neck pain 10/26/2017  . Other fatigue 03/25/2017  . Low vitamin D level 03/25/2017  . Routine adult health maintenance 09/20/2016  . Change in voice 03/25/2016  . Overactive bladder 03/25/2016  . 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 Multiple sclerosis ([place]) 06/25/2015  . Numbness and tingling in right hand 06/25/2015  . Gait disturbance 06/25/2015  . High risk medication use 06/25/2015  . History of cerebral aneurysm repair 06/25/2015  . Orthostatic hypotension 03/20/2015  . Right shoulder pain 03/20/2015  . Low back pain 03/20/2015  . Concern about STD in female without diagnosis 03/05/2014  . Essential hypertension, benign 03/04/2014    PAA,JENNIFER 04/19/2019, 12:26 PM  [facility] [address] [place], [place], [address] Phone: [phone]   Fax:   [phone]  Name: Darlene Mcconnell MRN: [id number] Date of Birth: [birth year]  PHYSICAL THERAPY DISCHARGE SUMMARY   Visits from Start of Care: 11  Current functional level related to goals / functional outcomes: See above for most recent info    Remaining deficits: None limiting function.    Education / Equipment: HEP, lifting principles  Plan: Patient agrees to discharge.  Patient goals were met. Patient is being discharged due to being pleased with the current functional level.  ?????     Raeford Razor, PT 04/19/19 12:26 PM Phone: [phone] Fax: [phone]

## 2019-05-01 ENCOUNTER — Telehealth: Payer: Self-pay | Admitting: *Deleted

## 2019-05-01 NOTE — Telephone Encounter (Signed)
Called pt. She agreed to transition appt at 10am tomorrow from [place] webex to [email or web address] instead since she had difficulties with this the first time. I cx cisco appt and emailed new link for [email or web address] to: [email or web address].

## 2019-05-02 ENCOUNTER — Encounter: Payer: Self-pay | Admitting: Neurology

## 2019-05-02 ENCOUNTER — Ambulatory Visit (INDEPENDENT_AMBULATORY_CARE_PROVIDER_SITE_OTHER): Payer: Medicaid Other | Admitting: Neurology

## 2019-05-02 ENCOUNTER — Other Ambulatory Visit: Payer: Self-pay

## 2019-05-02 DIAGNOSIS — G35 Multiple sclerosis: Secondary | ICD-10-CM | POA: Diagnosis not present

## 2019-05-02 DIAGNOSIS — M25511 Pain in right shoulder: Secondary | ICD-10-CM | POA: Diagnosis not present

## 2019-05-02 DIAGNOSIS — M5441 Lumbago with sciatica, right side: Secondary | ICD-10-CM | POA: Diagnosis not present

## 2019-05-02 DIAGNOSIS — R5383 Other fatigue: Secondary | ICD-10-CM | POA: Diagnosis not present

## 2019-05-02 DIAGNOSIS — R7989 Other specified abnormal findings of blood chemistry: Secondary | ICD-10-CM
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
## 2019-08-09 ENCOUNTER — Ambulatory Visit: Payer: Medicaid Other | Admitting: Family Medicine

## 2019-08-10 ENCOUNTER — Other Ambulatory Visit: Payer: Self-pay

## 2019-08-10 ENCOUNTER — Other Ambulatory Visit: Payer: Self-pay | Admitting: Family Medicine

## 2019-08-10 ENCOUNTER — Ambulatory Visit (INDEPENDENT_AMBULATORY_CARE_PROVIDER_SITE_OTHER): Payer: Medicaid Other | Admitting: Family Medicine

## 2019-08-10 VITALS — BP 204/88 | HR 63

## 2019-08-10 DIAGNOSIS — I10 Essential (primary) hypertension: Secondary | ICD-10-CM

## 2019-08-10 DIAGNOSIS — K219 Gastro-esophageal reflux disease without esophagitis: Secondary | ICD-10-CM

## 2019-08-10 DIAGNOSIS — D492 Neoplasm of unspecified behavior of bone, soft tissue, and skin: Secondary | ICD-10-CM | POA: Insufficient documentation

## 2019-08-10 MED ORDER — HYDROCHLOROTHIAZIDE 12.5 MG PO CAPS: 25.0000 mg | ORAL_CAPSULE | Freq: Every day | ORAL | 0 refills | Status: DC

## 2019-08-10 MED ORDER — RANITIDINE HCL 150 MG PO CAPS: 150.0000 mg | ORAL_CAPSULE | Freq: Two times a day (BID) | ORAL | 0 refills | Status: DC

## 2019-08-10 NOTE — Assessment & Plan Note (Signed)
Very poor control.  Patient's blood pressure check multiple times and remained elevated.  Likely secondary to noncompliance.  While on blood pressure medication she was normotensive.  Advised that she restart her hydrochlorothiazide 25 mg, I have sent in a refill.  Follow-up in 1 week.  Stressed the importance of following up.  Patient will need CBC and fasting lipids at that appointment time.  Will place future orders.  Strict return precautions given.  Advised patient to go the emergency room if developing symptoms.  Follow-up in 1 week
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
## 2019-08-10 NOTE — Assessment & Plan Note (Signed)
Symptoms consistent with acid reflux.  Will prescribe ranitidine.  Strict return precautions given.  Follow-up if no improvement.

## 2019-08-10 NOTE — Assessment & Plan Note (Signed)
Patient with nodule on clavicle bone on medial right side.  Has been present for 3 weeks.  Differentials include malignancy, fracture, dislocation.  Will obtain x-ray imaging.  Strict return precautions given.  Can consider further work-up for malignancy if x-ray is negative versus sending to sports medicine.

## 2019-08-10 NOTE — Patient Instructions (Signed)
It was a pleasure seeing you today.   Today we discussed your clavicle and blood pressure   For your clavicle: I have ordered an x-ray.  Please follow-up in 1 week  For your blood pressure: I have restarted your hydrochlorothiazide 25 mg.  It is very important you come back in in 1 week to have this followed up.  For your acid reflux: I have started you on a medication.  Take this daily as needed.  Please follow up in 1 week or sooner if symptoms persist or worsen. Please call the clinic immediately if you have any concerns.   Our clinic's number is [phone]. Please call with questions or concerns.   Please go to the emergency room if you have chest pain, shortness of breath, vision changes, headache  Thank you,  Caroline More, DO

## 2019-08-13 ENCOUNTER — Other Ambulatory Visit: Payer: Self-pay

## 2019-08-13 ENCOUNTER — Ambulatory Visit
Admission: RE | Admit: 2019-08-13 | Discharge: 2019-08-13 | Disposition: A | Discharge status: Home / Self Care | Payer: Medicaid Other | Source: Ambulatory Visit | Attending: Family Medicine | Admitting: Family Medicine

## 2019-08-13 DIAGNOSIS — R2231 Localized swelling, mass and lump, right upper limb: Secondary | ICD-10-CM | POA: Diagnosis not present

## 2019-08-13 DIAGNOSIS — D492 Neoplasm of unspecified behavior of bone, soft tissue, and skin: Secondary | ICD-10-CM

## 2019-08-14 ENCOUNTER — Other Ambulatory Visit: Payer: Self-pay | Admitting: Family Medicine

## 2019-08-14 DIAGNOSIS — M67819 Other specified disorders of synovium and tendon, unspecified shoulder: Secondary | ICD-10-CM

## 2019-08-16 ENCOUNTER — Ambulatory Visit (INDEPENDENT_AMBULATORY_CARE_PROVIDER_SITE_OTHER): Payer: Medicaid Other | Admitting: Family Medicine

## 2019-08-16 ENCOUNTER — Other Ambulatory Visit: Payer: Self-pay

## 2019-08-16 ENCOUNTER — Other Ambulatory Visit: Payer: Self-pay | Admitting: *Deleted

## 2019-08-16 ENCOUNTER — Encounter: Payer: Self-pay | Admitting: Family Medicine

## 2019-08-16 VITALS — BP 172/76 | HR 67 | Wt 123.6 lb

## 2019-08-16 DIAGNOSIS — I10 Essential (primary) hypertension: Secondary | ICD-10-CM | POA: Diagnosis not present

## 2019-08-16 MED ORDER — AMLODIPINE BESYLATE 5 MG PO TABS: 5.0000 mg | ORAL_TABLET | Freq: Every day | ORAL | 3 refills | Status: DC

## 2019-08-16 NOTE — Progress Notes (Signed)
   Subjective:   Patient ID: Darlene Mcconnell    DOB: 1956/09/04, 63 y.o. female   MRN: [id number]  Darlene Mcconnell is a 63 y.o. female here for blood pressure follow up.  Essential HTN: Patient seen by Dr. Tammi Klippel on 08/10/19 for elevated blood pressure, not compliant with prior anti-HTN meds. She was restarted on HCTZ 25mg  with close follow up. Her blood pressure today 172/76. Repeat BP 178/68. She notes she's been taking the HCTZ 12.5mg  BID.  Denies any SOB, CP, vision changes, LE edema, medication SEs, or symptoms of hypotension. Patient has allergy to Lisinopril.   Review of Systems:  Per HPI.   [place], medications and smoking status reviewed.  Objective:   BP (!) 172/76   Pulse 67   Wt 123 lb 9.6 oz (56.1 kg)   SpO2 99%   BMI 21.89 kg/m  Repeat BP: 178/68 Vitals and nursing note reviewed.  General: well nourished, well developed, in no acute distress with non-toxic appearance CV: regular rate and rhythm without murmurs, rubs, or gallops, no lower extremity edema Lungs: clear to auscultation bilaterally with normal work of breathing Abdomen: soft, non-tender, non-distended,  normoactive bowel sounds Skin: warm, dry Extremities: warm and well perfused Neuro: Alert and oriented, speech normal  Assessment & Plan:   Essential hypertension, benign Blood pressure improved from prior visit but remains elevated even after multiple checks. Some confusion on medication administration with HCTZ which could be contributing as she had only taken 12.5mg  this AM. Recommended patient take HCTZ 25mg  QD rather than splitting up the dose (12.5mg  BID). Given that she remains above goal, will also start second agent. - Continue HCTZ 25mg  QD  - Start Amlodipine 5mg  QD - BMP and lipid panel per Dr. Marilynne Drivers request obtained today - RTC in 2 weeks for blood pressure check - Recommended patient obtain home blood pressure cuff. Money is tight but she will do what she can.  - Strict return  precautions given.  Meds ordered this encounter  Medications  . amLODipine (NORVASC) 5 MG tablet    Sig: Take 1 tablet (5 mg total) by mouth at bedtime.    Dispense:  90 tablet    Refill:  [place], DO PGY-2, [facility] 08/16/2019 11:45 AM

## 2019-08-16 NOTE — Patient Instructions (Signed)
Thank you so much for coming in today.  Your blood pressure was still elevated today. I would like you to continue the Hydrochlorothiazide (HCTZ). Please take 2 pills (25mg  total) at the same time everyday. I have also started a new medicine called Amlodipine (Norvasc) 5mg . Please take 1 pill every day.   I would like you to return in 2 weeks for a blood pressure check.   Please call the clinic or report to the ED if you experience any chest pain, shortness of breath, or signs of low blood pressure such as lightheadedness or dizziness.   Take care, Dr. Tarry Kos

## 2019-08-16 NOTE — Assessment & Plan Note (Signed)
Blood pressure improved from prior visit but remains elevated even after multiple checks. Some confusion on medication administration with HCTZ which could be contributing as she had only taken 12.5mg  this AM. Recommended patient take HCTZ 25mg  QD rather than splitting up the dose (12.5mg  BID). Given that she remains above goal, will also start second agent. - Continue HCTZ 25mg  QD  - Start Amlodipine 5mg  QD - BMP and lipid panel per Dr. Marilynne Drivers request obtained today - RTC in 2 weeks for blood pressure check - Recommended patient obtain home blood pressure cuff. Money is tight but she will do what she can.  - Strict return precautions given.

## 2019-08-17 ENCOUNTER — Telehealth: Payer: Self-pay | Admitting: Family Medicine

## 2019-08-17 ENCOUNTER — Other Ambulatory Visit: Payer: Self-pay | Admitting: Family Medicine

## 2019-08-17 LAB — BASIC METABOLIC PANEL
BUN/Creatinine Ratio: 17 (ref 12–28)
BUN: 17 mg/dL (ref 8–27)
CO2: 24 mmol/L (ref 20–29)
Calcium: 10.4 mg/dL — ABNORMAL HIGH (ref 8.7–10.3)
Chloride: 101 mmol/L (ref 96–106)
Creatinine, Ser: 1.02 mg/dL — ABNORMAL HIGH (ref 0.57–1.00)
GFR calc Af Amer: 69 mL/min/{1.73_m2} (ref >59)
GFR calc non Af Amer: 60 mL/min/{1.73_m2} (ref >59)
Glucose: 59 mg/dL — ABNORMAL LOW (ref 65–99)
Potassium: 3.9 mmol/L (ref 3.5–5.2)
Sodium: 142 mmol/L (ref 134–144)

## 2019-08-17 LAB — LIPID PANEL
Chol/HDL Ratio: 2.3 ratio (ref 0.0–4.4)
Cholesterol, Total: 277 mg/dL — ABNORMAL HIGH (ref 100–199)
HDL: 119 mg/dL (ref >39)
LDL Calculated: 134 mg/dL — ABNORMAL HIGH (ref 0–99)
Triglycerides: 119 mg/dL (ref 0–149)
VLDL Cholesterol Cal: 24 mg/dL (ref 5–40)

## 2019-08-17 NOTE — Telephone Encounter (Signed)
Patient with elevated cholesterol and LDL.  Discussed starting a statin with patient.  Patient is resistant to starting a new medicine at this time.  Patient states that she would like to work on diet and exercise first.  States that her diet is very heavy and needs at this time and low in vegetables.  Will work on increasing her vegetable intake especially dark green leafy vegetables.  Patient will continue to exercise daily as well. Recommended repeat lipid testing in 6 months to a year to ensure that they are improving with diet and exercise.  Patient with elevated creatinine as well.  Likely secondary to some dehydration.  Advised to increase fluid intake.  Plans on discussing this further with her PCP when she has an appointment with him in 2 weeks.  Dalphine Handing, PGY-3 [facility] 08/17/2019 8:49 AM

## 2019-08-17 NOTE — Progress Notes (Signed)
Error

## 2019-08-22 ENCOUNTER — Other Ambulatory Visit: Payer: Self-pay

## 2019-08-22 ENCOUNTER — Encounter: Payer: Self-pay | Admitting: Family Medicine

## 2019-08-22 ENCOUNTER — Ambulatory Visit (INDEPENDENT_AMBULATORY_CARE_PROVIDER_SITE_OTHER): Payer: Medicaid Other | Admitting: Family Medicine

## 2019-08-22 VITALS — BP 140/68 | Ht 63.0 in | Wt 110.0 lb

## 2019-08-22 DIAGNOSIS — M19011 Primary osteoarthritis, right shoulder: Secondary | ICD-10-CM

## 2019-08-22 DIAGNOSIS — S46811A Strain of other muscles, fascia and tendons at shoulder and upper arm level, right arm, initial encounter: Secondary | ICD-10-CM | POA: Diagnosis not present

## 2019-08-22 DIAGNOSIS — M25511 Pain in right shoulder: Secondary | ICD-10-CM

## 2019-08-22 MED ORDER — MELOXICAM 15 MG PO TABS: 15.0000 mg | ORAL_TABLET | Freq: Every day | ORAL | 2 refills | Status: DC

## 2019-08-22 MED ORDER — METHOCARBAMOL 500 MG PO TABS: 500.0000 mg | ORAL_TABLET | Freq: Three times a day (TID) | ORAL | 1 refills | Status: DC | PRN

## 2019-08-22 NOTE — Progress Notes (Signed)
PCP: Bonnita Hollow, MD  Subjective:   HPI: Darlene Mcconnell is a 63 y.o. female here for right shoulder pain.  Darlene Mcconnell reports she's had at least a few months of sharp posterior right shoulder pain though this and a knot on medial aspect of her clavicle have been worse in past month. Darlene Mcconnell has tried heat, ibuprofen with mild benefit. Not tried massage or physical therapy for this. Pain sometimes radiates down to elbow, is sharp. No skin changes, numbness.  Past Medical History:  Diagnosis Date  . Amyotrophic lateral sclerosis/progressive muscular atrophy ([place])   . Hypertension   . 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 Migraine   . Multiple sclerosis ([place])   . Neuromuscular disorder [facility])     Current Outpatient Medications on File Prior to Visit  Medication Sig Dispense Refill  . amLODipine (NORVASC) 5 MG tablet Take 1 tablet (5 mg total) by mouth at bedtime. 90 tablet 3  . cholecalciferol (VITAMIN D) 25 MCG (1000 UT) tablet TAKE 1 TABLET BY MOUTH EVERY DAY 60 tablet 1  . famotidine (PEPCID) 20 MG tablet TAKE 1 TABLET BY MOUTH TWICE A DAY 30 tablet 0  . hydrochlorothiazide (MICROZIDE) 12.5 MG capsule Take 2 capsules (25 mg total) by mouth daily. 60 capsule 0  . terbinafine (LAMISIL) 250 MG tablet Take 1 tablet (250 mg total) by mouth daily. 30 tablet 2  . Teriflunomide (AUBAGIO) 14 MG TABS Take 1 tablet by mouth daily. 90 tablet 3   No current facility-administered medications on file prior to visit.     Past Surgical History:  Procedure Laterality Date  . BREAST BIOPSY Right   . CRANIOTOMY  08/17/2012   Procedure: CRANIOTOMY INTRACRANIAL ANEURYSM FOR CAROTID;  Surgeon: Winfield Cunas, MD;  Location: [place];  Service: Neurosurgery;  Laterality: Left;  Craniotomy for Aneurysm Clipping    Allergies  Allergen Reactions  . Lisinopril     Swelling of the lip    Social History   Socioeconomic History  . Marital status: Legally Separated    Spouse name: Not on file  .  Number of children: Not on file  . Years of education: Not on file  . Highest education level: Not on file  Occupational History  . Not on file  Social Needs  . Financial resource strain: Not on file  . Food insecurity    Worry: Not on file    Inability: Not on file  . Transportation needs    Medical: Not on file    Non-medical: Not on file  Tobacco Use  . Smoking status: Current Every Day Smoker    Packs/day: 0.25    Years: 12.00    Pack years: 3.00    Types: Cigarettes  . Smokeless tobacco: Never Used  Substance and Sexual Activity  . Alcohol use: Yes    Alcohol/week: 6.0 standard drinks    Types: 6 Cans of beer per week  . Drug use: Yes    Types: Marijuana    Comment: occasional  . Sexual activity: Not on file  Lifestyle  . Physical activity    Days per week: Not on file    Minutes per session: Not on file  . Stress: Not on file  Relationships  . Social Herbalist on phone: Not on file    Gets together: Not on file    Attends religious service: Not on file    Active member of club or organization: Not on file  Attends meetings of clubs or organizations: Not on file    Relationship status: Not on file  . Intimate partner violence    Fear of current or ex partner: Not on file    Emotionally abused: Not on file    Physically abused: Not on file    Forced sexual activity: Not on file  Other Topics Concern  . Not on file  Social History Narrative   Darlene Mcconnell does not drink caffeine.   Darlene Mcconnell is right handed.     Family History  Problem Relation Age of Onset  . Heart disease Father   . Dementia Other   . Breast cancer Neg Hx   . Colon cancer Neg Hx   . Colon polyps Neg Hx   . Esophageal cancer Neg Hx   . Liver cancer Neg Hx   . Ovarian cancer Neg Hx   . Pancreatic cancer Neg Hx   . Rectal cancer Neg Hx   . Stomach cancer Neg Hx     BP 140/68   Ht 5\' 3"  (1.6 m)   Wt 110 lb (49.9 kg)   BMI 19.49 kg/m   Review of Systems: See HPI  above.     Objective:  Physical Exam:  Gen: NAD, comfortable in exam room  Neck: No gross deformity, swelling, bruising. TTP right trapezius, cervical paraspinal region.  No midline/bony TTP. Extension to 30 degrees, full flexion and lateral rotations with pain right side with right lateral rotation. BUE strength 5/5.   Sensation intact to light touch.   2+ equal reflexes in triceps, biceps, brachioradialis tendons. Negative spurlings. NV intact distal BUEs.  Right shoulder: Slightly more prominent Richards joint compared to left.  No other swelling, ecchymoses.  No gross deformity. No TTP [place] joint.  TTP lateral trapezius, cervical paraspinal region. FROM without pain. Negative Hawkins, Neers. Negative Yergasons. Strength 5/5 with empty can and resisted internal/external rotation. Negative apprehension. NV intact distally.  Left shoulder: No swelling, ecchymoses.  No gross deformity. No TTP. FROM. Strength 5/5 with empty can and resisted internal/external rotation. NV intact distally.   Assessment & Plan:  1. Right shoulder pain - more consistent with mild cervical radiculopathy with trapezius spasms/strain.  Start physical therapy.  Meloxicam with robaxin as needed.  Heat for spasms.  Discussed ergonomic issues.  2. 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  No neovascularity on ultrasound - visualized mild spurring here with mild synovial hypertrophy.  Reassured - will monitor.

## 2019-08-22 NOTE — Patient Instructions (Signed)
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 You can ice this area 15 minutes at a time 3-4 times a day as needed to help with swelling.  You have a trapezius strain with mild cervical radiculopathy (irritated nerve from your neck). Meloxicam 15mg  daily with food for pain and inflammation - don't take aleve or ibuprofen while on this. Robaxin three times a day as needed for muscle spasms (do not drive with this if it makes you sleepy). Simple range of motion exercises within limits of pain to prevent further stiffness. Start physical therapy for stretching, exercises, traction, and modalities. Heat 15 minutes at a time 3-4 times a day to help with spasms. Watch head position when on computers, texting, when sleeping in bed - should in line with back to prevent further spasms. Follow up with me in 5-6 weeks.

## 2019-09-04 ENCOUNTER — Ambulatory Visit (INDEPENDENT_AMBULATORY_CARE_PROVIDER_SITE_OTHER): Payer: Medicaid Other | Admitting: Family Medicine

## 2019-09-04 ENCOUNTER — Other Ambulatory Visit: Payer: Self-pay

## 2019-09-04 ENCOUNTER — Encounter: Payer: Self-pay | Admitting: Family Medicine

## 2019-09-04 VITALS — BP 164/64 | HR 61 | Ht 63.0 in | Wt 125.5 lb

## 2019-09-04 DIAGNOSIS — I10 Essential (primary) hypertension: Secondary | ICD-10-CM

## 2019-09-04 DIAGNOSIS — Z720 Tobacco use: Secondary | ICD-10-CM | POA: Diagnosis not present

## 2019-09-04 MED ORDER — NICOTINE POLACRILEX 4 MG MT GUM: 4.0000 mg | CHEWING_GUM | OROMUCOSAL | 0 refills | Status: DC | PRN

## 2019-09-04 MED ORDER — HYDROCHLOROTHIAZIDE 25 MG PO TABS: 25.0000 mg | ORAL_TABLET | Freq: Every day | ORAL | 3 refills | Status: DC

## 2019-09-04 MED ORDER — AMLODIPINE BESYLATE 10 MG PO TABS: 10.0000 mg | ORAL_TABLET | Freq: Every day | ORAL | 11 refills | Status: DC

## 2019-09-04 NOTE — Progress Notes (Signed)
    Subjective:  Darlene Mcconnell is a 63 y.o. female who presents to the [facility] today with a chief complaint of hypertension.   HPI:  Hypertension, uncontrolled Seen in the office 2 weeks ago.  Started on amlodipine 5 mg nightly.  Also takes HCTZ 25 mg.  Denies chest pain, shortness of breath, lower extremity swelling, headache, change in vision.  Denies any other acute complaint.  Says that she feels well.  Patient says she watches her diet and exercise.  Patient checks her blood pressure at home and says that her systolics range from 0000000 to 160s.  Is consistent with today's reading.  Tobacco abuse Patient says she has cut back from a pack a day.  Says that she smokes when she drinks.  Drinks about 1 sixpack per week so roughly 1 drink a day.  Says that she smokes about 4 cigarettes a day.  She is interested in quitting.  She is surprised to learn that there is tension between smoking and her blood pressure.  She is motivated to quit because of her blood pressure.  She is never tried nothing in the past for quitting.  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  Says she will try to quit over the next month while she is waiting to be seen by her blood pressure.  ROS: Per HPI  PMH: Smoking history reviewed.    Objective:  Physical Exam: BP (!) 164/64   Pulse 61   Ht 5\' 3"  (1.6 m)   Wt 125 lb 8 oz (56.9 kg)   SpO2 98%   BMI 22.23 kg/m   Gen: NAD, resting comfortably CV: RRR with no murmurs appreciated Pulm: NWOB, CTAB with no crackles, wheezes, or rhonchi GI: Soft, Nontender, Nondistended. MSK: no edema, cyanosis, or clubbing noted Skin: warm, dry Neuro: grossly normal, moves all extremities Psych: Normal affect and thought content  Assessment/Plan:  Tobacco abuse Smoking 4 cigarettes a day.  Interested in quitting.  So that she will try to work on this over the next month.  Will trial NRT gum as patient was to do.  If patient successfully quit, I  suspect she will have improvement in blood pressure control.  Essential hypertension, benign Likely worsened by tobacco abuse.  Will increase amlodipine to 10 mg nightly.  Continue HCTZ.  Patient follow-up in 1 month.  Continue home blood pressure checks.  Will likely need a third agent as well.   Lab Orders  No laboratory test(s) ordered today    Meds ordered this encounter  Medications  . amLODipine (NORVASC) 10 MG tablet    Sig: Take 1 tablet (10 mg total) by mouth at bedtime.    Dispense:  30 tablet    Refill:  11  . hydrochlorothiazide (HYDRODIURIL) 25 MG tablet    Sig: Take 1 tablet (25 mg total) by mouth daily.    Dispense:  90 tablet    Refill:  3  . nicotine polacrilex (NICORETTE) 4 MG gum    Sig: Take 1 each (4 mg total) by mouth as needed for smoking cessation.    Dispense:  100 tablet    Refill:  0      Marny Lowenstein, MD, MS FAMILY MEDICINE RESIDENT - PGY3 09/04/2019 10:26 AM

## 2019-09-04 NOTE — Patient Instructions (Signed)
It was a pleasure to see you today! Thank you for choosing [facility] for your primary care. Darlene Mcconnell was seen for blood pressure check.  1.  For your blood pressure we are increasing your amlodipine to 10 mg at night.  Continue to check your blood pressure daily.  Come back to see me in 4 weeks for blood pressure check.  2.  For your smoking cessation, we will get a try the Nicorette gum.  Will also help you stop smoking.  Please also call the 100-quit-now.  I discussed assist with smoking cessation.  Best,  Marny Lowenstein, MD, MS FAMILY MEDICINE RESIDENT - PGY3 09/04/2019 9:59 AM

## 2019-09-04 NOTE — Assessment & Plan Note (Signed)
Smoking 4 cigarettes a day.  Interested in quitting.  So that she will try to work on this over the next month.  Will trial NRT gum as patient was to do.  If patient successfully quit, I suspect she will have improvement in blood pressure control.

## 2019-09-04 NOTE — Assessment & Plan Note (Signed)
Likely worsened by tobacco abuse.  Will increase amlodipine to 10 mg nightly.  Continue HCTZ.  Patient follow-up in 1 month.  Continue home blood pressure checks.  Will likely need a third agent as well.

## 2019-09-05 ENCOUNTER — Ambulatory Visit: Payer: No Typology Code available for payment source | Admitting: Physical Therapy

## 2019-09-07 ENCOUNTER — Encounter: Payer: Self-pay | Admitting: Physical Therapy

## 2019-09-07 ENCOUNTER — Ambulatory Visit: Payer: Medicaid Other | Attending: Obstetrics & Gynecology | Admitting: Physical Therapy

## 2019-09-07 ENCOUNTER — Other Ambulatory Visit: Payer: Self-pay

## 2019-09-07 DIAGNOSIS — M542 Cervicalgia: Secondary | ICD-10-CM | POA: Insufficient documentation

## 2019-09-07 DIAGNOSIS — M5412 Radiculopathy, cervical region: Secondary | ICD-10-CM | POA: Diagnosis not present

## 2019-09-07 DIAGNOSIS — G8929 Other chronic pain: Secondary | ICD-10-CM | POA: Diagnosis not present

## 2019-09-07 DIAGNOSIS — M25511 Pain in right shoulder: Secondary | ICD-10-CM | POA: Insufficient documentation

## 2019-09-07 NOTE — Therapy (Signed)
[place], [place], [address] Phone: [phone]   Fax:  [phone]  Physical Therapy Evaluation  Patient Details  Name: Darlene Mcconnell MRN: [id number] Date of Birth: December 05, 1956 Referring Provider (PT): Dr Karlton Lemon    Encounter Date: 09/07/2019  PT End of Session - 09/07/19 1154    Visit Number  1    Number of Visits  10    Date for PT Re-Evaluation  10/12/19       Past Medical History:  Diagnosis Date  . Amyotrophic lateral sclerosis/progressive muscular atrophy ([place])   . Hypertension   . 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 Migraine   . Multiple sclerosis ([place])   . Neuromuscular disorder [facility])     Past Surgical History:  Procedure Laterality Date  . BREAST BIOPSY Right   . CRANIOTOMY  08/17/2012   Procedure: CRANIOTOMY INTRACRANIAL ANEURYSM FOR CAROTID;  Surgeon: Winfield Cunas, MD;  Location: [place] [facility];  Service: Neurosurgery;  Laterality: Left;  Craniotomy for Aneurysm Clipping    There were no vitals filed for this visit.   Subjective Assessment - 09/07/19 0854    Subjective  Patient has had an increase in neck and shoulder pain over the past 3-4 months. She has had intemittent pain for years. She also notices that her proximal colar bone feels more prominent. She is having difficulty sleeping. She feels like ittakes her time to get comfortable at night.    Diagnostic tests  X-eya: Normal clavicale; Humeral greaer tuberosity hypertrophy consitent with chronic tendinosis    Patient Stated Goals  Having less pain; fix her shoulder    Currently in Pain?  Yes    Pain Score  3     Pain Location  Shoulder    Pain Orientation  Right    Pain Descriptors / Indicators  Aching    Pain Type  Chronic pain    Pain Radiating Towards  pain radiates down to the elbow    Pain Onset  More than a month ago    Pain Frequency  Constant    Aggravating Factors   nagging during the day. More  painful at night    Pain Relieving Factors  when she finds the right position.    Effect of Pain on Daily Activities  difficulty perfroming ADL's    Multiple Pain Sites  Yes    Pain Score  3    Pain Location  Neck    Pain Orientation  Right    Pain Descriptors / Indicators  Aching    Pain Type  Chronic pain    Pain Radiating Towards  raidating into the elbow    Pain Onset  More than a month ago    Pain Frequency  Constant    Aggravating Factors   turning her head    Pain Relieving Factors  rest         [facility] PT Assessment - 09/07/19 0001      Assessment   Medical Diagnosis  Cervical Spine and Right Shoulder Pain     Referring Provider (PT)  Dr Karlton Lemon     Onset Date/Surgical Date  --   3 month from onset of significant pain    Hand Dominance  Right    Next MD Visit  3-6 months     Prior Therapy  For ther right shoulder in 2017; for her low back earlier this year       Precautions  Precautions  None      Restrictions   Weight Bearing Restrictions  No      Balance Screen   Has the patient fallen in the past 6 months  No    Has the patient had a decrease in activity level because of a fear of falling?   No    Is the patient reluctant to leave their home because of a fear of falling?   No      Home Environment   Additional Comments  nothing significant       Prior Function   Level of Independence  Independent    Vocation  Unemployed    Leisure  Nothing       Cognition   Overall Cognitive Status  Within Functional Limits for tasks assessed    Attention  Focused    Focused Attention  Appears intact    Memory  Appears intact    Awareness  Appears intact    Problem Solving  Appears intact      Observation/Other Assessments   Observations  right shoulder elevated in sitting     Focus on Therapeutic Outcomes (FOTO)   Mediciad       Sensation   Light Touch  Appears Intact    Additional Comments  Pain radiates down into her right elbow       Coordination    Gross Motor Movements are Fluid and Coordinated  Yes    Fine Motor Movements are Fluid and Coordinated  Yes      Posture/Postural Control   Posture Comments  mildly forward head       ROM / Strength   AROM / PROM / Strength  PROM;AROM;Strength      AROM   AROM Assessment Site  Cervical;Shoulder    Right/Left Shoulder  Right    Right Shoulder Flexion  140 Degrees    Right Shoulder Internal Rotation  --   No limit    Right Shoulder External Rotation  --   has to go slow nad has pain    Cervical Flexion  40    Cervical Extension  30    Cervical - Right Rotation  58    Cervical - Left Rotation  88      PROM   Overall PROM Comments  full PROM of the right shoulder with pain with end range ER       Strength   Strength Assessment Site  Shoulder    Right/Left Shoulder  Right    Right Shoulder Flexion  4+/5    Right Shoulder External Rotation  4+/5    Right Shoulder Horizontal ABduction  5/5      Palpation   Palpation comment  Significatn spasming of upper trap on right side into levator area; mild TTP in the anterior shoulder                 Objective measurements completed on examination: See above findings.      [facility] Adult PT Treatment/Exercise - 09/07/19 0001      Exercises   Exercises  Neck      Neck Exercises: Seated   Other Seated Exercise  scap retraction 2x10       Manual Therapy   Manual Therapy  Joint mobilization;Soft tissue mobilization;Manual Traction    Joint Mobilization  Grade I and II PA an dinferior glides to the shoulder for pain     Soft tissue mobilization  trigger point release to the neclk  Manual Traction  to cervical spine       Neck Exercises: Stretches   Upper Trapezius Stretch  Right    Upper Trapezius Stretch Limitations  3x20 sec hold     Levator Stretch  Right    Levator Stretch Limitations  3x20 sec hold              PT Education - 09/07/19 1154    Education Details  HEP , symptom mnagement, POC    Person(s)  Educated  Patient    Methods  Explanation;Demonstration;Tactile cues;Verbal cues    Comprehension  Verbalized understanding;Returned demonstration;Verbal cues required;Tactile cues required;Need further instruction       PT Short Term Goals - 09/07/19 1100      PT SHORT TERM GOAL #1   Title  Patient will increase pain free active shoulder flexion to 155 degrees    Baseline  138 degrees with pain    Time  3    Period  Weeks    Status  New      PT SHORT TERM GOAL #2   Title  Patient will increase pain free right cervical rotation to 65 degrees    Baseline  58 degrees    Time  4    Status  New      PT SHORT TERM GOAL #3   Title  Patiet will increase gross right shoulder strength to 5/5    Baseline  4+/5 right shoulder flexion and abduction.    Time  4    Period  Weeks    Status  New    Target Date  10/05/19                Plan - 09/07/19 1048    Clinical Impression Statement  Patient is a 63 year old female with right cervial and shoulder pain. Her pain raidates down into her right elbow. She has limited active shoulder flexion but full passive motion. She has a negative empty can. Signs and symptoms are constent with cervical radiculopathy and mild right shoulder tendinitis. She has limited right rotation and a significant trigger point in her right upper trap. She would benefit from trigger point release, right shoulder strengthening, and postural correction.    Personal Factors and Comorbidities  Comorbidity 1;Comorbidity 2    Comorbidities  Low back pain, chronic right shoulder pain, ALS and MS per chart    Examination-Activity Limitations  Hygiene/Grooming;Adult nurse;Reach Overhead    Examination-Participation Restrictions  Cleaning;Laundry;Shop    Stability/Clinical Decision Making  Evolving/Moderate complexity    Clinical Decision Making  Moderate    Rehab Potential  Good    PT Frequency  2x / week    PT Duration  --   5 weeks   PT  Treatment/Interventions  ADLs/Self Care Home Management;Electrical Stimulation;Cryotherapy;Iontophoresis 4mg /ml Dexamethasone;Therapeutic activities;Therapeutic exercise;Manual techniques;Taping;Passive range of motion;Dry needling;Patient/family education;Moist Heat;Ultrasound    PT Next Visit Plan  consider dry needling; continue with shoulder mobilization and soft ttssue mobilization to cerivcal spine and upper trap; continue postural correction conisder supine bilateral ER yellow and horizontal abduction yellow.    PT Home Exercise Plan  upper trap stretch, levator stretch; scap retraction    Consulted and Agree with Plan of Care  Patient       Patient will benefit from skilled therapeutic intervention in order to improve the following deficits and impairments:  Decreased endurance, Decreased activity tolerance, Decreased mobility, Decreased strength, Impaired UE functional use, Decreased range of motion, Increased fascial restricitons, Increased  muscle spasms, Pain  Visit Diagnosis: Cervicalgia  Radiculopathy, cervical region  Chronic right shoulder pain     Problem List Patient Active Problem List   Diagnosis Date Noted  . Tobacco abuse 09/04/2019  . Abnormal growth of clavicle 08/10/2019  . GERD (gastroesophageal reflux disease) 08/10/2019  . Recurrent left knee instability 01/15/2019  . Insomnia 10/05/2018  . Back pain 03/15/2018  . Other fatigue 03/25/2017  . Low vitamin D level 03/25/2017  . Routine adult health maintenance 09/20/2016  . Change in voice 03/25/2016  . Overactive bladder 03/25/2016  . 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 Multiple sclerosis ([place]) 06/25/2015  . Gait disturbance 06/25/2015  . High risk medication use 06/25/2015  . History of cerebral aneurysm repair 06/25/2015  . Orthostatic hypotension 03/20/2015  . Right shoulder pain 03/20/2015  . Low back pain 03/20/2015  . Concern about STD in female without diagnosis 03/05/2014  . Essential  hypertension, benign 03/04/2014    Carney Living PT DPT  09/07/2019, 12:02 PM  [facility] [address] [place], [place], [address] Phone: [phone]   Fax:  [phone]  Name: AVIANCE LANDRON MRN: [id number] Date of Birth: December 27, 1956

## 2019-09-10 ENCOUNTER — Other Ambulatory Visit: Payer: Self-pay | Admitting: Family Medicine

## 2019-09-17 ENCOUNTER — Telehealth: Payer: Self-pay | Admitting: *Deleted

## 2019-09-17 NOTE — Telephone Encounter (Signed)
Received fax from [place] one to one that [place] will expire soon. I submitted PA on nctracks. Waiting on determination.

## 2019-09-18 ENCOUNTER — Other Ambulatory Visit: Payer: Self-pay | Admitting: Neurology

## 2019-09-18 ENCOUNTER — Ambulatory Visit: Payer: Medicaid Other | Admitting: Physical Therapy

## 2019-09-18 ENCOUNTER — Telehealth: Payer: Self-pay | Admitting: Physical Therapy

## 2019-09-18 NOTE — Telephone Encounter (Signed)
Pt did not show for appt on 09/18/19 Pt stated that her ride did not come today. Pt called and rescheduled for appt on 09/19/19 at 8:45 AM  Voncille Lo, PT Certified Exercise Expert for the Aging Adult  09/18/19 9:17 AM Phone: [phone] Fax: [phone]

## 2019-09-18 NOTE — Telephone Encounter (Signed)
Checked status of PA on nctracks website. PA approved 09/17/19-09/11/20. Prior Approval [id number].  Faxed notice of approval to [facility] one to one at [phone]. Received fax confirmation.

## 2019-09-19 ENCOUNTER — Other Ambulatory Visit: Payer: Self-pay

## 2019-09-19 ENCOUNTER — Ambulatory Visit: Payer: Medicaid Other | Admitting: Physical Therapy

## 2019-09-19 ENCOUNTER — Encounter: Payer: Self-pay | Admitting: Physical Therapy

## 2019-09-19 DIAGNOSIS — M542 Cervicalgia: Secondary | ICD-10-CM

## 2019-09-19 DIAGNOSIS — G8929 Other chronic pain: Secondary | ICD-10-CM

## 2019-09-19 DIAGNOSIS — M5412 Radiculopathy, cervical region: Secondary | ICD-10-CM

## 2019-09-19 NOTE — Therapy (Signed)
[place], [place], [address] Phone: [phone]   Fax:  [phone]  Physical Therapy Treatment  Patient Details  Name: Darlene Mcconnell MRN: [id number] Date of Birth: 1956/12/18 Referring Provider (PT): Dr Karlton Lemon    Encounter Date: 09/19/2019  PT End of Session - 09/19/19 0840    Visit Number  2    Number of Visits  10    Date for PT Re-Evaluation  10/12/19    Authorization Type  MCD:    PT Start Time  0846    PT Stop Time  0928    PT Time Calculation (min)  42 min    Activity Tolerance  Patient tolerated treatment well    Behavior During Therapy  [facility] for tasks assessed/performed       Past Medical History:  Diagnosis Date  . Amyotrophic lateral sclerosis/progressive muscular atrophy ([place])   . Hypertension   . 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 Migraine   . Multiple sclerosis ([place])   . Neuromuscular disorder [facility])     Past Surgical History:  Procedure Laterality Date  . BREAST BIOPSY Right   . CRANIOTOMY  08/17/2012   Procedure: CRANIOTOMY INTRACRANIAL ANEURYSM FOR CAROTID;  Surgeon: Winfield Cunas, MD;  Location: [place] [facility];  Service: Neurosurgery;  Laterality: Left;  Craniotomy for Aneurysm Clipping    There were no vitals filed for this visit.  Subjective Assessment - 09/19/19 0848    Subjective  "The pain is coming back, pain is at a 5/10"    Patient Stated Goals  Having less pain; fix her shoulder    Currently in Pain?  Yes    Pain Score  5     Pain Orientation  Right    Pain Descriptors / Indicators  Aching    Pain Type  Chronic pain    Pain Onset  More than a month ago    Pain Frequency  Constant         [facility] PT Assessment - 09/19/19 0001      Assessment   Medical Diagnosis  Cervical Spine and Right Shoulder Pain     Referring Provider (PT)  Dr Karlton Lemon                    [facility] Adult PT Treatment/Exercise - 09/19/19 0001      Neck  Exercises: Machines for Strengthening   UBE (Upper Arm Bike)  L1 x 71min   changing direction x 2 min     Neck Exercises: Seated   Neck Retraction  10 reps;5 secs    Other Seated Exercise  seated thoracic rotation and extension 2 x 10 with arms crossed   cues for proper form      Modalities   Modalities  Moist Heat      Moist Heat Therapy   Number Minutes Moist Heat  10 Minutes    Moist Heat Location  Cervical   in supine     Manual Therapy   Manual therapy comments  skilled palpation and monitoring of pt throughout TPDN    Joint Mobilization  Grade III C3-C7, Grade III PA t1-T6, bil first rib inferior mobs grade III    Soft tissue mobilization  IASTM along the R upper trap/ levator scapuale.    Manual Traction  cervical x 5 min       Neck Exercises: Stretches   Upper Trapezius Stretch  Right;2 reps;30 seconds  PT Education - 09/19/19 0858    Education Details  muscle anatomy and referral patterns. What TPDN is, benefits of treatment. Reviewed previous HEP    Person(s) Educated  Patient    Methods  Explanation;Verbal cues;Handout    Comprehension  Verbalized understanding;Verbal cues required       PT Short Term Goals - 09/07/19 1100      PT SHORT TERM GOAL #1   Title  Patient will increase pain free active shoulder flexion to 155 degrees    Baseline  138 degrees with pain    Time  3    Period  Weeks    Status  New      PT SHORT TERM GOAL #2   Title  Patient will increase pain free right cervical rotation to 65 degrees    Baseline  58 degrees    Time  4    Status  New      PT SHORT TERM GOAL #3   Title  Patiet will increase gross right shoulder strength to 5/5    Baseline  4+/5 right shoulder flexion and abduction.    Time  4    Period  Weeks    Status  New    Target Date  10/05/19               Plan - 09/19/19 0911    Clinical Impression Statement  pt reports continued pain in the R shoulder today with intermittent referral down  the RUE. educated and consent was provided for TPDN focusing on the R upper trap. utilized STW and mobs to promote ROM. she did well with exercise reporting relief of pain at end of the session.    PT Treatment/Interventions  ADLs/Self Care Home Management;Electrical Stimulation;Cryotherapy;Iontophoresis 4mg /ml Dexamethasone;Therapeutic activities;Therapeutic exercise;Manual techniques;Taping;Passive range of motion;Dry needling;Patient/family education;Moist Heat;Ultrasound    PT Next Visit Plan  response to TPDN,  continue with shoulder mobilization and soft ttssue mobilization to cerivcal spine and upper trap; continue postural correction conisder supine bilateral ER yellow and horizontal abduction yellow.    PT Home Exercise Plan  upper trap stretch, levator stretch; scap retraction    Consulted and Agree with Plan of Care  Patient       Patient will benefit from skilled therapeutic intervention in order to improve the following deficits and impairments:  Decreased endurance, Decreased activity tolerance, Decreased mobility, Decreased strength, Impaired UE functional use, Decreased range of motion, Increased fascial restricitons, Increased muscle spasms, Pain  Visit Diagnosis: Cervicalgia  Radiculopathy, cervical region  Chronic right shoulder pain     Problem List Patient Active Problem List   Diagnosis Date Noted  . Tobacco abuse 09/04/2019  . Abnormal growth of clavicle 08/10/2019  . GERD (gastroesophageal reflux disease) 08/10/2019  . Recurrent left knee instability 01/15/2019  . Insomnia 10/05/2018  . Back pain 03/15/2018  . Other fatigue 03/25/2017  . Low vitamin D level 03/25/2017  . Routine adult health maintenance 09/20/2016  . Change in voice 03/25/2016  . Overactive bladder 03/25/2016  . 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 Multiple sclerosis ([place]) 06/25/2015  . Gait disturbance 06/25/2015  . High risk medication use 06/25/2015  . History of cerebral  aneurysm repair 06/25/2015  . Orthostatic hypotension 03/20/2015  . Right shoulder pain 03/20/2015  . Low back pain 03/20/2015  . Concern about STD in female without diagnosis 03/05/2014  . Essential hypertension, benign 03/04/2014    Starr Lake PT, DPT, LAT, ATC  09/19/19  9:25 AM  [place], [place], [address] Phone: [phone]   Fax:  [phone]  Name: Darlene Mcconnell MRN: [id number] Date of Birth: 1956/03/30

## 2019-09-25 ENCOUNTER — Other Ambulatory Visit: Payer: Self-pay

## 2019-09-25 ENCOUNTER — Ambulatory Visit: Payer: Medicaid Other | Admitting: Physical Therapy

## 2019-09-25 ENCOUNTER — Encounter: Payer: Self-pay | Admitting: Physical Therapy

## 2019-09-25 DIAGNOSIS — G8929 Other chronic pain: Secondary | ICD-10-CM

## 2019-09-25 DIAGNOSIS — M542 Cervicalgia: Secondary | ICD-10-CM | POA: Diagnosis not present

## 2019-09-25 DIAGNOSIS — M5412 Radiculopathy, cervical region: Secondary | ICD-10-CM

## 2019-09-25 DIAGNOSIS — M25511 Pain in right shoulder: Secondary | ICD-10-CM | POA: Diagnosis not present

## 2019-09-25 NOTE — Therapy (Signed)
[place], [place], [address] Phone: [phone]   Fax:  [phone]  Physical Therapy Treatment  Patient Details  Name: Darlene Mcconnell MRN: [id number] Date of Birth: 01/19/1956 Referring Provider (PT): Dr Karlton Lemon    Encounter Date: 09/25/2019    Past Medical History:  Diagnosis Date  . Amyotrophic lateral sclerosis/progressive muscular atrophy ([place])   . Hypertension   . 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 Migraine   . Multiple sclerosis ([place])   . Neuromuscular disorder [facility])     Past Surgical History:  Procedure Laterality Date  . BREAST BIOPSY Right   . CRANIOTOMY  08/17/2012   Procedure: CRANIOTOMY INTRACRANIAL ANEURYSM FOR CAROTID;  Surgeon: Winfield Cunas, MD;  Location: [place] [facility];  Service: Neurosurgery;  Laterality: Left;  Craniotomy for Aneurysm Clipping    There were no vitals filed for this visit.  Subjective Assessment - 09/25/19 0852    Subjective  Patient reports it is a little tight. She reports ost of her pain is in her lower back today.    Patient Stated Goals  Having less pain; fix her shoulder    Currently in Pain?  No/denies   Just some tightness                      [facility] Adult PT Treatment/Exercise - 09/25/19 0001      Neck Exercises: Standing   Other Standing Exercises  scap retraction with cuing for posture and rbreathing       Neck Exercises: Seated   Other Seated Exercise  seated thoracic rotation and extension 2 x 10 with arms crossed   cues for proper form      Manual Therapy   Manual therapy comments  skilled palpation and monitoring of pt throughout TPDN    Joint Mobilization  Grade III C3-C7, Grade III PA t1-T6, bil first rib inferior mobs grade III    Soft tissue mobilization  IASTM along the R upper trap/ levator scapuale.    Manual Traction  cervical x 5 min       Neck Exercises: Stretches   Upper Trapezius Stretch  Right;2  reps;30 seconds    Levator Stretch  Right    Levator Stretch Limitations  3x20 sec hold        Trigger Point Dry Needling - 09/25/19 0001    Consent Given?  Yes    Education Handout Provided  Previously provided    Muscles Treated Head and Neck  Upper trapezius;Levator scapulae    Other Dry Needling  3x40 needle used 2x each muscle     Upper Trapezius Response  Twitch reponse elicited    Levator Scapulae Response  Twitch response elicited           PT Education - 09/25/19 0854    Education Details  reviewed benefits and risks of TPDN    Person(s) Educated  Patient    Methods  Explanation;Demonstration;Tactile cues;Verbal cues    Comprehension  Returned demonstration;Verbal cues required;Verbalized understanding;Tactile cues required       PT Short Term Goals - 09/07/19 1100      PT SHORT TERM GOAL #1   Title  Patient will increase pain free active shoulder flexion to 155 degrees    Baseline  138 degrees with pain    Time  3    Period  Weeks    Status  New      PT SHORT  TERM GOAL #2   Title  Patient will increase pain free right cervical rotation to 65 degrees    Baseline  58 degrees    Time  4    Status  New      PT SHORT TERM GOAL #3   Title  Patiet will increase gross right shoulder strength to 5/5    Baseline  4+/5 right shoulder flexion and abduction.    Time  4    Period  Weeks    Status  New    Target Date  10/05/19               Plan - 09/25/19 1226    Clinical Impression Statement  Patient had a great twitch response in her levaotr on the right and her upper trap on the left. The patient is making progress. she has decreased pain in her neck and upper trap. She would benefit from increaisng exercises. She is also having lower back pain. She has been here recently for her lower back she was advised to continue with the stretches that were just givin to her. Therapy reviewed stretching to reduce post-needle soreness.    Personal Factors and  Comorbidities  Comorbidity 1;Comorbidity 2    Comorbidities  Low back pain, chronic right shoulder pain, ALS and MS per chart    Examination-Activity Limitations  Hygiene/Grooming;Adult nurse;Reach Overhead    Examination-Participation Restrictions  Cleaning;Laundry;Shop    Stability/Clinical Decision Making  Evolving/Moderate complexity    Clinical Decision Making  Moderate    Rehab Potential  Good    PT Frequency  2x / week    PT Treatment/Interventions  ADLs/Self Care Home Management;Electrical Stimulation;Cryotherapy;Iontophoresis 4mg /ml Dexamethasone;Therapeutic activities;Therapeutic exercise;Manual techniques;Taping;Passive range of motion;Dry needling;Patient/family education;Moist Heat;Ultrasound    PT Next Visit Plan  response to TPDN,  continue with shoulder mobilization and soft ttssue mobilization to cerivcal spine and upper trap; continue postural correction conisder supine bilateral ER yellow and horizontal abduction yellow.    PT Home Exercise Plan  upper trap stretch, levator stretch; scap retraction    Consulted and Agree with Plan of Care  Patient       Patient will benefit from skilled therapeutic intervention in order to improve the following deficits and impairments:  Decreased endurance, Decreased activity tolerance, Decreased mobility, Decreased strength, Impaired UE functional use, Decreased range of motion, Increased fascial restricitons, Increased muscle spasms, Pain  Visit Diagnosis: Cervicalgia  Radiculopathy, cervical region  Chronic right shoulder pain     Problem List Patient Active Problem List   Diagnosis Date Noted  . Tobacco abuse 09/04/2019  . Abnormal growth of clavicle 08/10/2019  . GERD (gastroesophageal reflux disease) 08/10/2019  . Recurrent left knee instability 01/15/2019  . Insomnia 10/05/2018  . Back pain 03/15/2018  . Other fatigue 03/25/2017  . Low vitamin D level 03/25/2017  . Routine adult health maintenance 09/20/2016   . Change in voice 03/25/2016  . Overactive bladder 03/25/2016  . 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 Multiple sclerosis ([place]) 06/25/2015  . Gait disturbance 06/25/2015  . High risk medication use 06/25/2015  . History of cerebral aneurysm repair 06/25/2015  . Orthostatic hypotension 03/20/2015  . Right shoulder pain 03/20/2015  . Low back pain 03/20/2015  . Concern about STD in female without diagnosis 03/05/2014  . Essential hypertension, benign 03/04/2014    Carney Living  PT DPT  09/25/2019, 12:33 PM  [facility] [address] [place], [place], [address] Phone: [phone]  Fax:  [phone]  Name: Darlene Mcconnell MRN: [id number] Date of Birth: 1956-08-09

## 2019-09-26 ENCOUNTER — Ambulatory Visit: Payer: Medicaid Other | Admitting: Family Medicine

## 2019-09-26 ENCOUNTER — Encounter: Payer: Medicaid Other | Admitting: Physical Therapy

## 2019-09-26 ENCOUNTER — Ambulatory Visit: Payer: Medicaid Other | Admitting: Physical Therapy

## 2019-09-27 ENCOUNTER — Other Ambulatory Visit: Payer: Self-pay | Admitting: *Deleted

## 2019-09-28 MED ORDER — FAMOTIDINE 20 MG PO TABS: 20.0000 mg | ORAL_TABLET | Freq: Two times a day (BID) | ORAL | 0 refills | Status: DC

## 2019-10-02 ENCOUNTER — Other Ambulatory Visit: Payer: Self-pay | Admitting: Neurology

## 2019-10-02 ENCOUNTER — Ambulatory Visit: Payer: Medicaid Other | Attending: Obstetrics & Gynecology | Admitting: Physical Therapy

## 2019-10-02 ENCOUNTER — Other Ambulatory Visit: Payer: Self-pay

## 2019-10-02 ENCOUNTER — Encounter: Payer: Self-pay | Admitting: Physical Therapy

## 2019-10-02 DIAGNOSIS — M25511 Pain in right shoulder: Secondary | ICD-10-CM | POA: Diagnosis not present

## 2019-10-02 DIAGNOSIS — G8929 Other chronic pain: Secondary | ICD-10-CM | POA: Insufficient documentation

## 2019-10-02 DIAGNOSIS — M542 Cervicalgia: Secondary | ICD-10-CM | POA: Insufficient documentation

## 2019-10-02 DIAGNOSIS — M5412 Radiculopathy, cervical region: Secondary | ICD-10-CM | POA: Insufficient documentation

## 2019-10-02 NOTE — Therapy (Addendum)
[place], [place], [address] Phone: [phone]   Fax:  [phone]  Physical Therapy Treatment/Discharge Note  Patient Details  Name: Darlene Mcconnell MRN: [id number] Date of Birth: [birth year] Referring Provider (PT): Dr Karlton Lemon    Encounter Date: 10/02/2019  PT End of Session - 10/02/19 0900    Visit Number  3    Number of Visits  10    Date for PT Re-Evaluation  10/12/19    Authorization Type  MCD:    PT Start Time  0850    PT Stop Time  0941    PT Time Calculation (min)  51 min       Past Medical History:  Diagnosis Date  . Amyotrophic lateral sclerosis/progressive muscular atrophy ([place])   . Hypertension   . 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 Migraine   . Multiple sclerosis ([place])   . Neuromuscular disorder [facility])     Past Surgical History:  Procedure Laterality Date  . BREAST BIOPSY Right   . CRANIOTOMY  08/17/2012   Procedure: CRANIOTOMY INTRACRANIAL ANEURYSM FOR CAROTID;  Surgeon: Winfield Cunas, MD;  Location: [place] [facility];  Service: Neurosurgery;  Laterality: Left;  Craniotomy for Aneurysm Clipping    There were no vitals filed for this visit.  Subjective Assessment - 10/02/19 0853    Subjective  Since I have been doing the dry needling and it has helped so very much.  I am feeling better    Diagnostic tests  X-eya: Normal clavicale; Humeral greaer tuberosity hypertrophy consitent with chronic tendinosis    Patient Stated Goals  Having less pain; fix her shoulder    Pain Score  3     Pain Location  Shoulder    Pain Orientation  Right    Pain Descriptors / Indicators  Aching    Pain Type  Chronic pain    Pain Onset  More than a month ago    Pain Frequency  Intermittent    Pain Score  3    Pain Location  Neck    Pain Orientation  Right    Pain Descriptors / Indicators  Aching    Pain Type  Chronic pain    Pain Onset  More than a month ago                        [facility] Adult PT Treatment/Exercise - 10/02/19 0001      Neck Exercises: Standing   Other Standing Exercises  scap retraction with cuing for posture and rbreathing       Neck Exercises: Seated   Other Seated Exercise  seated thoracic rotation and extension 2 x 10 with arms crossed   cues for proper form      Neck Exercises: Supine   Other Supine Exercise  red t band supine scapular stabilizers, bil flexion , horizontal abd, Diagonals and  bil ER each 15 x with VC for proper breathing      Modalities   Modalities  Moist Heat      Moist Heat Therapy   Number Minutes Moist Heat  10 Minutes    Moist Heat Location  Cervical      Manual Therapy   Manual Therapy  Joint mobilization;Soft tissue mobilization;Manual Traction    Manual therapy comments  skilled palpation and monitoring of pt throughout TPDN    Joint Mobilization  Grade III C3-C7, Grade III PA t1-T6, bil first  rib inferior mobs grade III    Soft tissue mobilization  IASTM along the R upper trap/ levator scapuale.    Manual Traction  cervical x 5 min       Neck Exercises: Stretches   Upper Trapezius Stretch  Right;2 reps;30 seconds    Upper Trapezius Stretch Limitations  3 x 20 sec hold    Levator Stretch  Right    Levator Stretch Limitations  3x20 sec hold        Trigger Point Dry Needling - 10/02/19 0001    Consent Given?  Yes    Education Handout Provided  Previously provided    Muscles Treated Head and Neck  Upper trapezius;Levator scapulae;Cervical multifidi    Upper Trapezius Response  Twitch reponse elicited;Palpable increased muscle length    Levator Scapulae Response  Twitch response elicited;Palpable increased muscle length    Cervical multifidi Response  Palpable increased muscle length   RT C-3/4          PT Education - 10/02/19 0859    Education Details  added to HEP supine scapular stabiliers with red t band    Person(s) Educated  Patient    Methods   Explanation;Demonstration;Tactile cues;Verbal cues;Handout    Comprehension  Verbalized understanding;Returned demonstration       PT Short Term Goals - 09/07/19 1100      PT SHORT TERM GOAL #1   Title  Patient will increase pain free active shoulder flexion to 155 degrees    Baseline  138 degrees with pain    Time  3    Period  Weeks    Status  New      PT SHORT TERM GOAL #2   Title  Patient will increase pain free right cervical rotation to 65 degrees    Baseline  58 degrees    Time  4    Status  New      PT SHORT TERM GOAL #3   Title  Patiet will increase gross right shoulder strength to 5/5    Baseline  4+/5 right shoulder flexion and abduction.    Time  4    Period  Weeks    Status  New    Target Date  10/05/19               Plan - 10/02/19 6378    Clinical Impression Statement  Ms Weightman consented toTPDN and feels great relief from [facility].  Pt had good twitch response to upper traps/levator bil. and decreased neck pain from 3/10 to 1/10 at end of session.  Pt educated on supine scapular stabiliers with red t band and reviewed curerent exercise program for home use Will continue POC    Personal Factors and Comorbidities  Comorbidity 1;Comorbidity 2    Comorbidities  Low back pain, chronic right shoulder pain, ALS and MS per chart    Examination-Activity Limitations  Hygiene/Grooming;Adult nurse;Reach Overhead    Examination-Participation Restrictions  Cleaning;Laundry;Shop    Stability/Clinical Decision Making  Evolving/Moderate complexity    Clinical Decision Making  Moderate    Rehab Potential  Good    PT Frequency  2x / week    PT Treatment/Interventions  ADLs/Self Care Home Management;Electrical Stimulation;Cryotherapy;Iontophoresis 3m/ml Dexamethasone;Therapeutic activities;Therapeutic exercise;Manual techniques;Taping;Passive range of motion;Dry needling;Patient/family education;Moist Heat;Ultrasound    PT Next Visit Plan  response to TPDN,   continue with shoulder mobilization and soft ttssue mobilization to cerivcal spine and upper trap  continue with deep neck flexor strength and cervical stabilization  PT Home Exercise Plan  upper trap stretch, levator stretch; scap retraction supine scapular stabilizers with red t band    Consulted and Agree with Plan of Care  Patient       Patient will benefit from skilled therapeutic intervention in order to improve the following deficits and impairments:  Decreased endurance, Decreased activity tolerance, Decreased mobility, Decreased strength, Impaired UE functional use, Decreased range of motion, Increased fascial restricitons, Increased muscle spasms, Pain  Visit Diagnosis: Cervicalgia  Radiculopathy, cervical region  Chronic right shoulder pain     Problem List Patient Active Problem List   Diagnosis Date Noted  . Tobacco abuse 09/04/2019  . Abnormal growth of clavicle 08/10/2019  . GERD (gastroesophageal reflux disease) 08/10/2019  . Recurrent left knee instability 01/15/2019  . Insomnia 10/05/2018  . Back pain 03/15/2018  . Other fatigue 03/25/2017  . Low vitamin D level 03/25/2017  . Routine adult health maintenance 09/20/2016  . Change in voice 03/25/2016  . Overactive bladder 03/25/2016  . 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 Multiple sclerosis ([place]) 06/25/2015  . Gait disturbance 06/25/2015  . High risk medication use 06/25/2015  . History of cerebral aneurysm repair 06/25/2015  . Orthostatic hypotension 03/20/2015  . Right shoulder pain 03/20/2015  . Low back pain 03/20/2015  . Concern about STD in female without diagnosis 03/05/2014  . Essential hypertension, benign 03/04/2014   Voncille Lo, PT Certified Exercise Expert for the Aging Adult  10/02/19 9:46 AM Phone: [phone] Fax: [facility] [address] [place], [place], [address] Phone: [phone]   Fax:   [phone]  Name: Darlene Mcconnell MRN: [id number] Date of Birth: [birth year]   PHYSICAL THERAPY DISCHARGE SUMMARY  Visits from Start of Care: 3  Current functional level related to goals / functional outcomes: unknown   Remaining deficits: unknown   Education / Equipment: Initial HEP  Plan: Patient agrees to discharge.  Patient goals were not met. Patient is being discharged due to not returning since the last visit.  ?????     Pt did not return for subsequent visits Voncille Lo, PT Certified Exercise Expert for the Aging Adult  01/10/20 8:09 AM Phone: [phone] Fax: [phone]

## 2019-10-02 NOTE — Patient Instructions (Signed)
Over Head Pull: Narrow Grip       On back, knees bent, feet flat, band across thighs, elbows straight but relaxed. Pull hands apart (start). Keeping elbows straight, bring arms up and over head, hands toward floor. Keep pull steady on band. Hold momentarily. Return slowly, keeping pull steady, back to start. Repeat _15__ times. Band color __red____   Side Pull: Double Arm   On back, knees bent, feet flat. Arms perpendicular to body, shoulder level, elbows straight but relaxed. Pull arms out to sides, elbows straight. Resistance band comes across collarbones, hands toward floor. Hold momentarily. Slowly return to starting position. Repeat _15__ times. Band color __red___   Elmer Picker   On back, knees bent, feet flat, left hand on left hip, right hand above left. Pull right arm DIAGONALLY (hip to shoulder) across chest. Bring right arm along head toward floor. Hold momentarily. Slowly return to starting position. Repeat _15__ times. Do with left arm. Band color __red____   Shoulder Rotation: Double Arm   On back, knees bent, feet flat, elbows tucked at sides, bent 90, hands palms up. Pull hands apart and down toward floor, keeping elbows near sides. Hold momentarily. Slowly return to starting position. Repeat _15__ times. Band color _red_____   Voncille Lo, PT Certified Exercise Expert for the Aging Adult  10/02/19 8:59 AM Phone: [phone] Fax: [phone]

## 2019-10-03 ENCOUNTER — Encounter: Payer: Self-pay | Admitting: Family Medicine

## 2019-10-03 ENCOUNTER — Ambulatory Visit (INDEPENDENT_AMBULATORY_CARE_PROVIDER_SITE_OTHER): Payer: Medicaid Other | Admitting: Family Medicine

## 2019-10-03 VITALS — BP 124/70 | Ht 63.0 in | Wt 125.0 lb

## 2019-10-03 DIAGNOSIS — M25511 Pain in right shoulder: Secondary | ICD-10-CM

## 2019-10-03 NOTE — Progress Notes (Signed)
PCP: Bonnita Hollow, MD  Subjective:   HPI: Patient is a 63 y.o. female here for right shoulder pain.  8/26: Patient reports she's had at least a few months of sharp posterior right shoulder pain though this and a knot on medial aspect of her clavicle have been worse in past month. Patient has tried heat, ibuprofen with mild benefit. Not tried massage or physical therapy for this. Pain sometimes radiates down to elbow, is sharp. No skin changes, numbness.  10/7: Patient reports she feels much better. Has been doing physical therapy and home exercises. Started to get low back pain bilaterally but no radiation, no radicular symptoms. No skin changes.  Past Medical History:  Diagnosis Date  . Amyotrophic lateral sclerosis/progressive muscular atrophy ([place])   . Hypertension   . 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 Migraine   . Multiple sclerosis ([place])   . Neuromuscular disorder [facility])     Current Outpatient Medications on File Prior to Visit  Medication Sig Dispense Refill  . amLODipine (NORVASC) 10 MG tablet Take 1 tablet (10 mg total) by mouth at bedtime. 30 tablet 11  . AUBAGIO 14 MG TABS TAKE ONE TABLET BY MOUTH ONCE DAILY 30 tablet 1  . cholecalciferol (VITAMIN D) 25 MCG (1000 UT) tablet TAKE 1 TABLET BY MOUTH EVERY DAY 60 tablet 1  . famotidine (PEPCID) 20 MG tablet Take 1 tablet (20 mg total) by mouth 2 (two) times daily. 30 tablet 0  . hydrochlorothiazide (HYDRODIURIL) 25 MG tablet Take 1 tablet (25 mg total) by mouth daily. 90 tablet 3  . meloxicam (MOBIC) 15 MG tablet Take 1 tablet (15 mg total) by mouth daily. 30 tablet 2  . methocarbamol (ROBAXIN) 500 MG tablet Take 1 tablet (500 mg total) by mouth every 8 (eight) hours as needed. 60 tablet 1  . nicotine polacrilex (NICORETTE) 4 MG gum Take 1 each (4 mg total) by mouth as needed for smoking cessation. 100 tablet 0  . terbinafine (LAMISIL) 250 MG tablet Take 1 tablet (250 mg total) by mouth daily. 30 tablet 2   No  current facility-administered medications on file prior to visit.     Past Surgical History:  Procedure Laterality Date  . BREAST BIOPSY Right   . CRANIOTOMY  08/17/2012   Procedure: CRANIOTOMY INTRACRANIAL ANEURYSM FOR CAROTID;  Surgeon: Winfield Cunas, MD;  Location: [place] [facility];  Service: Neurosurgery;  Laterality: Left;  Craniotomy for Aneurysm Clipping    Allergies  Allergen Reactions  . Lisinopril     Swelling of the lip    Social History   Socioeconomic History  . Marital status: Legally Separated    Spouse name: Not on file  . Number of children: Not on file  . Years of education: Not on file  . Highest education level: Not on file  Occupational History  . Not on file  Social Needs  . Financial resource strain: Not on file  . Food insecurity    Worry: Not on file    Inability: Not on file  . Transportation needs    Medical: Not on file    Non-medical: Not on file  Tobacco Use  . Smoking status: Current Every Day Smoker    Packs/day: 0.25    Years: 12.00    Pack years: 3.00    Types: Cigarettes  . Smokeless tobacco: Never Used  Substance and Sexual Activity  . Alcohol use: Yes    Alcohol/week: 6.0 standard drinks  Types: 6 Cans of beer per week  . Drug use: Yes    Types: Marijuana    Comment: occasional  . Sexual activity: Not on file  Lifestyle  . Physical activity    Days per week: Not on file    Minutes per session: Not on file  . Stress: Not on file  Relationships  . Social Herbalist on phone: Not on file    Gets together: Not on file    Attends religious service: Not on file    Active member of club or organization: Not on file    Attends meetings of clubs or organizations: Not on file    Relationship status: Not on file  . Intimate partner violence    Fear of current or ex partner: Not on file    Emotionally abused: Not on file    Physically abused: Not on file    Forced sexual activity: Not on file  Other Topics Concern  .  Not on file  Social History Narrative   Patient does not drink caffeine.   Patient is right handed.     Family History  Problem Relation Age of Onset  . Heart disease Father   . Dementia Other   . Breast cancer Neg Hx   . Colon cancer Neg Hx   . Colon polyps Neg Hx   . Esophageal cancer Neg Hx   . Liver cancer Neg Hx   . Ovarian cancer Neg Hx   . Pancreatic cancer Neg Hx   . Rectal cancer Neg Hx   . Stomach cancer Neg Hx     BP 124/70   Ht 5\' 3"  (1.6 m)   Wt 125 lb (56.7 kg)   BMI 22.14 kg/m   Review of Systems: See HPI above.     Objective:  Physical Exam:  Gen: NAD, comfortable in exam room  Neck: No gross deformity, swelling, bruising. Mild TTP right trapezius.  No midline/bony TTP. FROM. BUE strength 5/5.   Sensation intact to light touch.   Negative spurlings. NV intact distal BUEs.  Right shoulder: No deformity. FROM with 5/5 strength. No tenderness to palpation. NVI distally.   Assessment & Plan:  1. Right shoulder pain - 2/2 mild cervical radiculopathy with trapezius spasms/strain.  Doing well with physical therapy - will transition to home exercise program.  Meloxicam, robaxin only if needed.  Heat for spasms.  F/u prn.  Briefly discussed her low back - no midline tenderness and no radicular symptoms, negative SLR.  Start home exercise program.

## 2019-10-03 NOTE — Patient Instructions (Signed)
Continue home exercises for your neck and trapezius for about 4 more weeks. Start home exercises for your low back until pain has resolved. You probably do need a new mattress but comfort dictates which one you should get. Meloxicam, robaxin only if needed. Follow up with me as needed otherwise.

## 2019-10-04 ENCOUNTER — Ambulatory Visit: Payer: Medicaid Other | Admitting: Physical Therapy

## 2019-10-08 ENCOUNTER — Ambulatory Visit: Payer: Medicaid Other | Admitting: Physical Therapy

## 2019-10-10 ENCOUNTER — Ambulatory Visit: Payer: Medicaid Other | Admitting: Physical Therapy

## 2019-10-16 ENCOUNTER — Other Ambulatory Visit: Payer: Self-pay | Admitting: Family Medicine

## 2019-10-16 DIAGNOSIS — B351 Tinea unguium: Secondary | ICD-10-CM

## 2019-11-23 ENCOUNTER — Other Ambulatory Visit: Payer: Self-pay | Admitting: Family Medicine

## 2019-12-06 ENCOUNTER — Other Ambulatory Visit: Payer: Self-pay | Admitting: Neurology

## 2019-12-19 ENCOUNTER — Other Ambulatory Visit: Payer: Self-pay | Admitting: Neurology

## 2020-01-04 ENCOUNTER — Ambulatory Visit: Payer: Medicaid Other

## 2020-01-31 ENCOUNTER — Other Ambulatory Visit: Payer: Self-pay | Admitting: Neurology

## 2020-02-04 ENCOUNTER — Other Ambulatory Visit: Payer: Self-pay | Admitting: Family Medicine

## 2020-02-15 ENCOUNTER — Other Ambulatory Visit: Payer: Self-pay | Admitting: Neurology

## 2020-02-18 ENCOUNTER — Telehealth: Payer: Self-pay

## 2020-02-18 MED ORDER — MELOXICAM 15 MG PO TABS: 15.0000 mg | ORAL_TABLET | Freq: Every day | ORAL | 0 refills | Status: DC

## 2020-02-18 NOTE — Telephone Encounter (Signed)
Per Dr. Barbaraann Barthel- will refill for one month. If she would like additional refills after that she will need an appt to see him in the office. Pt understands.

## 2020-03-11 ENCOUNTER — Other Ambulatory Visit: Payer: Self-pay | Admitting: Family Medicine

## 2020-04-07 ENCOUNTER — Other Ambulatory Visit: Payer: Self-pay | Admitting: Family Medicine

## 2020-04-07 DIAGNOSIS — B351 Tinea unguium: Secondary | ICD-10-CM

## 2020-04-09 NOTE — Telephone Encounter (Signed)
Patient should be seen prior to refill to determine if refill is indicated. Please have patient schedule follow up appointment at earliest convenience.

## 2020-04-10 NOTE — Telephone Encounter (Signed)
I will defer to PCP appt on 4/28 as this medication does require periodic monitoring of labs.

## 2020-04-10 NOTE — Telephone Encounter (Signed)
Called patient and she states that she has an appointment with Dr. Grandville Silos on 04/23/2020.  She will discuss the use of Lamisil at that time.  She does however still use it and needs it.  Darlene Mcconnell, [place]

## 2020-04-21 ENCOUNTER — Other Ambulatory Visit: Payer: Self-pay | Admitting: Neurology

## 2020-04-23 ENCOUNTER — Encounter: Payer: Self-pay | Admitting: Family Medicine

## 2020-04-23 ENCOUNTER — Ambulatory Visit (INDEPENDENT_AMBULATORY_CARE_PROVIDER_SITE_OTHER): Payer: Medicaid Other | Admitting: Family Medicine

## 2020-04-23 ENCOUNTER — Other Ambulatory Visit: Payer: Self-pay

## 2020-04-23 VITALS — BP 140/70 | HR 68 | Wt 122.1 lb

## 2020-04-23 DIAGNOSIS — Z760 Encounter for issue of repeat prescription: Secondary | ICD-10-CM

## 2020-04-23 DIAGNOSIS — R0602 Shortness of breath: Secondary | ICD-10-CM | POA: Diagnosis not present

## 2020-04-23 DIAGNOSIS — E785 Hyperlipidemia, unspecified: Secondary | ICD-10-CM | POA: Diagnosis not present

## 2020-04-23 DIAGNOSIS — I10 Essential (primary) hypertension: Secondary | ICD-10-CM

## 2020-04-23 MED ORDER — MELOXICAM 15 MG PO TABS: 15.0000 mg | ORAL_TABLET | Freq: Every day | ORAL | 0 refills | Status: AC

## 2020-04-23 MED ORDER — METHOCARBAMOL 500 MG PO TABS: 500.0000 mg | ORAL_TABLET | Freq: Three times a day (TID) | ORAL | 1 refills | Status: AC | PRN

## 2020-04-23 MED ORDER — FAMOTIDINE 20 MG PO TABS: 20.0000 mg | ORAL_TABLET | Freq: Two times a day (BID) | ORAL | 3 refills | Status: AC

## 2020-04-23 MED ORDER — NIFEDIPINE ER OSMOTIC RELEASE 30 MG PO TB24: 30.0000 mg | ORAL_TABLET | Freq: Every day | ORAL | 3 refills | Status: DC

## 2020-04-23 MED ORDER — VITAMIN D 25 MCG (1000 UNIT) PO TABS: ORAL_TABLET | ORAL | 1 refills | Status: DC

## 2020-04-23 MED ORDER — HYDROCHLOROTHIAZIDE 25 MG PO TABS: 25.0000 mg | ORAL_TABLET | Freq: Every day | ORAL | 3 refills | Status: AC

## 2020-04-23 MED ORDER — ATORVASTATIN CALCIUM 40 MG PO TABS: 40.0000 mg | ORAL_TABLET | Freq: Every day | ORAL | 3 refills | Status: AC

## 2020-04-23 NOTE — Assessment & Plan Note (Signed)
Chronic.  Without hypoxia at risk for ischemic heart disease given hyperlipidemia, hypertension.  Cannot rule out heart failure although no signs of active exacerbation on exam.  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 -Echocardiogram -CBC -Follow-up in 1 month, consider PFTs

## 2020-04-23 NOTE — Assessment & Plan Note (Signed)
Currently only taking HCTZ.  Did not tolerate amlodipine.  Contraindication for ACEs due to foot swelling.  Shared decision making in trying another calcium channel blocker, nifedipine. -Procardia 30 mg -Follow-up in 1 month for blood pressure

## 2020-04-23 NOTE — Assessment & Plan Note (Signed)
ASCVD score greater than 18%. -Atorvastatin 40 mg

## 2020-04-23 NOTE — Progress Notes (Signed)
    SUBJECTIVE:   CHIEF COMPLAINT / HPI:   Patient is here for follow-up from blood pressure and medication refill  Hypertension  shortness of breath Patient stopped taking her amlodipine at home because she said it made her feel "funny headed" when she laid down.  She has been taking her blood pressure at home and it runs with systolics [id number].  Patient endorses shortness of breath over the past year.  This however is not worsening.  She thinks this is due to her smoking. Patient has had no wheezing, cough, trouble breathing at night, lower extremity swelling, chest pain.  Medication refill Patient request medication refill as ordered below  Hyperlipidemia Patient with history of hyperlipidemia with ASCVD score greater than 18%  Tobacco abuse Patient still smokes occasional cigarettes.  She is not interested in quitting.   PERTINENT  PMH / PSH:   OBJECTIVE:   BP 140/70   Pulse 68   Wt 122 lb 2 oz (55.4 kg)   SpO2 99%   BMI 21.63 kg/m   Gen: NAD, resting comfortably CV: RRR with no murmurs appreciated Pulm: NWOB, CTAB with no crackles, wheezes, or rhonchi GI: Normal bowel sounds present. Soft, Nontender, Nondistended. MSK: no edema, cyanosis, or clubbing noted Skin: warm, dry Neuro: grossly normal, moves all extremities Psych: Normal affect and thought content   ASSESSMENT/PLAN:   Problem List Items Addressed This Visit      Cardiovascular and Mediastinum   Essential hypertension - Primary    Currently only taking HCTZ.  Did not tolerate amlodipine.  Contraindication for ACEs due to foot swelling.  Shared decision making in trying another calcium channel blocker, nifedipine. -Procardia 30 mg -Follow-up in 1 month for blood pressure      Relevant Medications   NIFEdipine (PROCARDIA-XL/NIFEDICAL-XL) 30 MG 24 hr tablet   hydrochlorothiazide (HYDRODIURIL) 25 MG tablet   atorvastatin (LIPITOR) 40 MG tablet   Other Relevant Orders   Basic Metabolic Panel     Other     Shortness of breath    Chronic.  Without hypoxia at risk for ischemic heart disease given hyperlipidemia, hypertension.  Cannot rule out heart failure although no signs of active exacerbation on exam.  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 -Echocardiogram -CBC -Follow-up in 1 month, consider PFTs      Relevant Orders   CBC   ECHOCARDIOGRAM COMPLETE   Hyperlipidemia    ASCVD score greater than 18%. -Atorvastatin 40 mg      Relevant Medications   NIFEdipine (PROCARDIA-XL/NIFEDICAL-XL) 30 MG 24 hr tablet   hydrochlorothiazide (HYDRODIURIL) 25 MG tablet   atorvastatin (LIPITOR) 40 MG tablet   Other Relevant Orders   Lipid panel    Other Visit Diagnoses    Encounter for medication refill       Relevant Medications   cholecalciferol (VITAMIN D3) 25 MCG (1000 UNIT) tablet   famotidine (PEPCID) 20 MG tablet   hydrochlorothiazide (HYDRODIURIL) 25 MG tablet   meloxicam (MOBIC) 15 MG tablet   methocarbamol (ROBAXIN) 500 MG tablet      Bonnita Hollow, MD [place]

## 2020-04-23 NOTE — Patient Instructions (Addendum)
We refilled her medications.  We started a new blood pressure medication called nifedipine.  We are checking your kidneys and your cholesterol.

## 2020-04-24 LAB — BASIC METABOLIC PANEL
BUN/Creatinine Ratio: 18 (ref 12–28)
BUN: 15 mg/dL (ref 8–27)
CO2: 21 mmol/L (ref 20–29)
Calcium: 9.5 mg/dL (ref 8.7–10.3)
Chloride: 108 mmol/L — ABNORMAL HIGH (ref 96–106)
Creatinine, Ser: 0.85 mg/dL (ref 0.57–1.00)
GFR calc Af Amer: 85 mL/min/{1.73_m2} (ref >59)
GFR calc non Af Amer: 74 mL/min/{1.73_m2} (ref >59)
Glucose: 113 mg/dL — ABNORMAL HIGH (ref 65–99)
Potassium: 3.7 mmol/L (ref 3.5–5.2)
Sodium: 143 mmol/L (ref 134–144)

## 2020-04-24 LAB — CBC
Hematocrit: 37.9 % (ref 34.0–46.6)
Hemoglobin: 12.7 g/dL (ref 11.1–15.9)
MCH: 34.1 pg — ABNORMAL HIGH (ref 26.6–33.0)
MCHC: 33.5 g/dL (ref 31.5–35.7)
MCV: 102 fL — ABNORMAL HIGH (ref 79–97)
Platelets: 267 10*3/uL (ref 150–450)
RBC: 3.72 x10E6/uL — ABNORMAL LOW (ref 3.77–5.28)
RDW: 13.2 % (ref 11.7–15.4)
WBC: 9.5 10*3/uL (ref 3.4–10.8)

## 2020-04-24 LAB — LIPID PANEL
Chol/HDL Ratio: 2 ratio (ref 0.0–4.4)
Cholesterol, Total: 259 mg/dL — ABNORMAL HIGH (ref 100–199)
HDL: 128 mg/dL (ref >39)
LDL Chol Calc (NIH): 117 mg/dL — ABNORMAL HIGH (ref 0–99)
Triglycerides: 87 mg/dL (ref 0–149)
VLDL Cholesterol Cal: 14 mg/dL (ref 5–40)

## 2020-04-25 ENCOUNTER — Encounter: Payer: Self-pay | Admitting: Family Medicine

## 2020-04-29 ENCOUNTER — Ambulatory Visit (HOSPITAL_COMMUNITY)
Admission: RE | Admit: 2020-04-29 | Discharge: 2020-04-29 | Disposition: A | Discharge status: Home / Self Care | Payer: Medicaid Other | Source: Ambulatory Visit | Attending: Family Medicine | Admitting: Family Medicine

## 2020-04-29 ENCOUNTER — Other Ambulatory Visit: Payer: Self-pay

## 2020-04-29 DIAGNOSIS — I081 Rheumatic disorders of both mitral and tricuspid valves: Secondary | ICD-10-CM | POA: Insufficient documentation

## 2020-04-29 DIAGNOSIS — Z87891 Personal history of nicotine dependence: Secondary | ICD-10-CM | POA: Insufficient documentation

## 2020-04-29 DIAGNOSIS — I10 Essential (primary) hypertension: Secondary | ICD-10-CM | POA: Insufficient documentation

## 2020-04-29 DIAGNOSIS — G35 Multiple sclerosis: Secondary | ICD-10-CM | POA: Diagnosis not present

## 2020-04-29 DIAGNOSIS — R0602 Shortness of breath: Secondary | ICD-10-CM

## 2020-04-29 NOTE — Progress Notes (Signed)
  Echocardiogram 2D Echocardiogram has been performed.  Darlene Mcconnell 04/29/2020, 3:44 PM

## 2020-04-30 NOTE — Progress Notes (Signed)
Echo with G1DD, but normal EF. No cardiac cause for concern for SOB. Called patient. LVM.

## 2020-04-30 NOTE — Progress Notes (Signed)
Re- routed to PCP to explain to patient what exactly theses terms mean. Salvatore Marvel, CMA

## 2020-06-05 ENCOUNTER — Ambulatory Visit: Payer: Medicaid Other | Admitting: Neurology

## 2020-06-05 ENCOUNTER — Encounter: Payer: Self-pay | Admitting: Neurology

## 2020-06-05 ENCOUNTER — Telehealth: Payer: Self-pay | Admitting: Neurology

## 2020-06-05 VITALS — BP 158/83 | HR 61 | Ht 63.0 in | Wt 121.0 lb

## 2020-06-05 DIAGNOSIS — G4700 Insomnia, unspecified: Secondary | ICD-10-CM | POA: Diagnosis not present

## 2020-06-05 DIAGNOSIS — Z79899 Other long term (current) drug therapy: Secondary | ICD-10-CM

## 2020-06-05 DIAGNOSIS — I10 Essential (primary) hypertension: Secondary | ICD-10-CM

## 2020-06-05 DIAGNOSIS — E559 Vitamin D deficiency, unspecified: Secondary | ICD-10-CM | POA: Diagnosis not present

## 2020-06-05 DIAGNOSIS — G35 Multiple sclerosis: Secondary | ICD-10-CM

## 2020-06-05 NOTE — Telephone Encounter (Signed)
Medicaid order sent to GI. They will obtain the auth and reach out to the patient to schedule.  
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
## 2020-06-06 LAB — COMPREHENSIVE METABOLIC PANEL
ALT: 9 IU/L (ref 0–32)
AST: 16 IU/L (ref 0–40)
Albumin/Globulin Ratio: 1.7 (ref 1.2–2.2)
Albumin: 4.4 g/dL (ref 3.8–4.8)
Alkaline Phosphatase: 67 IU/L (ref 48–121)
BUN/Creatinine Ratio: 18 (ref 12–28)
BUN: 16 mg/dL (ref 8–27)
Bilirubin Total: 0.4 mg/dL (ref 0.0–1.2)
CO2: 28 mmol/L (ref 20–29)
Calcium: 10.7 mg/dL — ABNORMAL HIGH (ref 8.7–10.3)
Chloride: 102 mmol/L (ref 96–106)
Creatinine, Ser: 0.9 mg/dL (ref 0.57–1.00)
GFR calc Af Amer: 79 mL/min/{1.73_m2} (ref >59)
GFR calc non Af Amer: 69 mL/min/{1.73_m2} (ref >59)
Globulin, Total: 2.6 g/dL (ref 1.5–4.5)
Glucose: 80 mg/dL (ref 65–99)
Potassium: 4.2 mmol/L (ref 3.5–5.2)
Sodium: 145 mmol/L — ABNORMAL HIGH (ref 134–144)
Total Protein: 7 g/dL (ref 6.0–8.5)

## 2020-06-06 LAB — CBC WITH DIFFERENTIAL/PLATELET
Basophils Absolute: 0.1 10*3/uL (ref 0.0–0.2)
Basos: 1 %
EOS (ABSOLUTE): 0.1 10*3/uL (ref 0.0–0.4)
Eos: 1 %
Hematocrit: 41.1 % (ref 34.0–46.6)
Hemoglobin: 13.6 g/dL (ref 11.1–15.9)
Immature Grans (Abs): 0 10*3/uL (ref 0.0–0.1)
Immature Granulocytes: 0 %
Lymphocytes Absolute: 2.8 10*3/uL (ref 0.7–3.1)
Lymphs: 32 %
MCH: 33.5 pg — ABNORMAL HIGH (ref 26.6–33.0)
MCHC: 33.1 g/dL (ref 31.5–35.7)
MCV: 101 fL — ABNORMAL HIGH (ref 79–97)
Monocytes Absolute: 0.8 10*3/uL (ref 0.1–0.9)
Monocytes: 9 %
Neutrophils Absolute: 5 10*3/uL (ref 1.4–7.0)
Neutrophils: 57 %
Platelets: 286 10*3/uL (ref 150–450)
RBC: 4.06 x10E6/uL (ref 3.77–5.28)
RDW: 13.1 % (ref 11.7–15.4)
WBC: 8.8 10*3/uL (ref 3.4–10.8)

## 2020-06-06 LAB — VITAMIN D 25 HYDROXY (VIT D DEFICIENCY, FRACTURES): Vit D, 25-Hydroxy: 19.5 ng/mL — ABNORMAL LOW (ref 30.0–100.0)

## 2020-06-12 ENCOUNTER — Telehealth: Payer: Self-pay

## 2020-06-12 MED ORDER — VITAMIN D (ERGOCALCIFEROL) 1.25 MG (50000 UNIT) PO CAPS
50000.0000 [IU] | ORAL_CAPSULE | ORAL | 3 refills | Status: DC
Start: 2020-06-12 — End: 2020-07-23

## 2020-06-12 NOTE — Addendum Note (Signed)
Addended by: Verlin Grills on: 06/12/2020 02:14 PM   Modules accepted: Orders

## 2020-06-12 NOTE — Telephone Encounter (Signed)
I called pt. No answer, left a message asking pt to call me back.   

## 2020-06-12 NOTE — Telephone Encounter (Signed)
Pt has called Megan,RN back. Please call. 

## 2020-06-12 NOTE — Telephone Encounter (Signed)
06/06/2020 12:50 PM EDT     Vit D was low  please send in a prescription for 50000 U weekly # 13 with 3 refills Other labs were ok   I was able to discuss labs and she verbalized understanding. Rx for vitamin D has been sent to [facility]

## 2020-06-16 ENCOUNTER — Other Ambulatory Visit: Payer: Medicaid Other

## 2020-07-19 ENCOUNTER — Ambulatory Visit
Admission: RE | Admit: 2020-07-19 | Discharge: 2020-07-19 | Disposition: A | Discharge status: Home / Self Care | Payer: Medicaid Other | Source: Ambulatory Visit | Attending: Neurology | Admitting: Neurology

## 2020-07-19 DIAGNOSIS — G35 Multiple sclerosis: Secondary | ICD-10-CM

## 2020-07-19 MED ORDER — GADOBENATE DIMEGLUMINE 529 MG/ML IV SOLN
11.0000 mL | Freq: Once | INTRAVENOUS | Status: AC | PRN
  Administered 2020-07-19: 11 mL via INTRAVENOUS

## 2020-07-21 ENCOUNTER — Other Ambulatory Visit: Payer: Self-pay | Admitting: Neurology

## 2020-07-21 ENCOUNTER — Telehealth: Payer: Self-pay | Admitting: Neurology

## 2020-07-21 NOTE — Telephone Encounter (Signed)
I spoke with her about the MRI of the brain showed a new lesion in the left posterior frontal lobe.  She is currently on Aubagio.  Since she has some breakthrough disease.  We need to consider a different disease modifying therapy, though continuing on Aubagio is also an option.  I would like her to come in so that she can be reassessed and we can go over the options and choose a different medicine, if she decides to switch it.  Terrence Dupont, can you see if we can get her in sometime in the next 2 to 3 weeks?

## 2020-07-22 NOTE — Telephone Encounter (Signed)
Called, LVM for pt offering appt tomorrow at 9am with Dr. Felecia Shelling, check in at 43am. Asked that she call back today to let me know if this works. If she calls and it works, please schedule. Thank you

## 2020-07-22 NOTE — Telephone Encounter (Signed)
Called pt again. She accepted appt for tomorrow at 9am with Dr. Felecia Shelling. Advised her to check in no later than 845am. Placed her on schedule.

## 2020-07-23 ENCOUNTER — Ambulatory Visit (INDEPENDENT_AMBULATORY_CARE_PROVIDER_SITE_OTHER): Payer: Medicaid Other | Admitting: Neurology

## 2020-07-23 ENCOUNTER — Other Ambulatory Visit: Payer: Self-pay | Admitting: *Deleted

## 2020-07-23 ENCOUNTER — Other Ambulatory Visit: Payer: Self-pay

## 2020-07-23 ENCOUNTER — Encounter: Payer: Self-pay | Admitting: *Deleted

## 2020-07-23 ENCOUNTER — Encounter: Payer: Self-pay | Admitting: Neurology

## 2020-07-23 VITALS — BP 181/94 | HR 63 | Ht 63.0 in | Wt 121.0 lb

## 2020-07-23 DIAGNOSIS — Z79899 Other long term (current) drug therapy: Secondary | ICD-10-CM | POA: Diagnosis not present

## 2020-07-23 DIAGNOSIS — R269 Unspecified abnormalities of gait and mobility: Secondary | ICD-10-CM

## 2020-07-23 DIAGNOSIS — E559 Vitamin D deficiency, unspecified: Secondary | ICD-10-CM | POA: Diagnosis not present

## 2020-07-23 DIAGNOSIS — G35 Multiple sclerosis: Secondary | ICD-10-CM | POA: Diagnosis not present

## 2020-07-23 MED ORDER — VITAMIN D (ERGOCALCIFEROL) 1.25 MG (50000 UNIT) PO CAPS: 50000.0000 [IU] | ORAL_CAPSULE | ORAL | 3 refills | Status: AC

## 2020-07-23 MED ORDER — OXYBUTYNIN CHLORIDE ER 10 MG PO TB24: 10.0000 mg | ORAL_TABLET | Freq: Every day | ORAL | 5 refills | Status: AC

## 2020-07-23 NOTE — Progress Notes (Signed)
Faxed completed/signed [employer] start form to [facility] at [phone]. Received fax confirmation.  Received fax from [facility] that they received start form and are processing.

## 2020-07-23 NOTE — Progress Notes (Signed)
[facility]  PATIENT: Darlene Mcconnell DOB: 03-10-1956  REFERRING DOCTOR OR PCP: Josephine Igo SOURCE: patient  _________________________________   HISTORICAL  CHIEF COMPLAINT:  Chief Complaint  Patient presents with  . Follow-up    RM 13, alone. Last seen 06/05/2020. Here to discuss other DMT options. Currently on Aubagio but recent imaging showed disease progression.    HISTORY OF PRESENT ILLNESS:  Darlene Mcconnell is a 64 y.o. woman who was diagnosed with relapsing remitting MS in July 2016.    Update 07/23/2020: She had a brain MRI showing a newer focus in the left posterior frontal lobe.  It did not enhance.  In retrospect she had noted that the right arm seemed more numb and clumsy for a while but it is better now than earlier in the year.    She has been on Aubagio since diagnosis in 2016 and she has tolerated it well.  Gait is a little off balance but she does not have any recent falls.  To do some at times when she walks.  The right arm still seems slightly clumsier than the left but is mostly recovered.  She has some pain in her legs with a cramp-like sensation.  Sometimes she uses a heating pad with some benefit.  She has urinary urgency with occ urge incontinence.   She was referred to urology but she did not receive any medications.  We had a long discussion today about the significance of the MRI findings ans d options 1.  Stay on Aubagio 2.  Switch.   She would like to switch to Vumerity.     She notes some fatigue.  She sleeps well most nights but sometimes does go to bed too late.  She started the vitamin D supplements but missed took the instructions and was taking 50,000 units a day for 7 days.  We went over how to properly take the medication.  MS History:    She began to experience numbness in the right arm in early 2016. She noted numbness in the hand and higher up. A couple months later she began to have some pain in the shoulder  and the neck area. On 05/07/2015, she had an MRI of the cervical spine that I personally reviewed. It shows a small focus of hyperintense signal adjacent to C2-C3 most evident on inversion recovery images but more subtly present on axial images as well. It was suggestive of MS and an MRI of the brain was performed on 06/09/2015. I personally reviewed those images. That shows multiple white matter foci in a pattern consistent with the diagnosis of MS. Specifically, foci of present in the periventricular and juxtacortical white matter. There is one enhancing focus in the right occipital lobe. The presence of an enhancing lesion along with chronic periventricular and juxtacortical lesions and the presence of the spinal cord plaque allow [place] to make the diagnosis of multiple sclerosis with relative certainty.  She was started on Aubagio in 2016.  She did not have any definite exacerbation.  However, an MRI July 2021 showed a large new lesion in the left posterior frontal lobe.  She opted to switch to Vumerity.  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 This was felt to be symptomatic as she had eyelid drooping and she also had severe headaches though there was no definite evidence of rupture. Surgery was performed by Dr. Christella Noa.    REVIEW OF  SYSTEMS: Constitutional: No fevers, chills, sweats, or change in appetite.   She notes some fatigue and mild sleepiness.  She has a delayed phase sleep disorder Eyes: No visual changes, double vision, eye pain Ear, nose and throat: No hearing loss, ear pain, nasal congestion, sore throat Cardiovascular: No chest pain, palpitations Respiratory: No shortness of breath at rest or with exertion.   No wheezes GastrointestinaI: No nausea, vomiting, diarrhea, abdominal pain, fecal incontinence Genitourinary: No dysuria, urinary retention or frequency.  No nocturia. Musculoskeletal: No neck pain, back  pain Integumentary: No rash, pruritus, skin lesions Neurological: as above Psychiatric: No depression at this time.  No anxiety Endocrine: No palpitations, diaphoresis, change in appetite, change in weigh or increased thirst Hematologic/Lymphatic: No anemia, purpura, petechiae. Allergic/Immunologic: No itchy/runny eyes, nasal congestion, recent allergic reactions, rashes  ALLERGIES: Allergies  Allergen Reactions  . Lisinopril     Swelling of the lip    HOME MEDICATIONS:  Current Outpatient Medications:  .  atorvastatin (LIPITOR) 40 MG tablet, Take 1 tablet (40 mg total) by mouth daily., Disp: 90 tablet, Rfl: 3 .  cholecalciferol (VITAMIN D3) 25 MCG (1000 UNIT) tablet, TAKE 1 TABLET BY MOUTH EVERY DAY, Disp: 60 tablet, Rfl: 1 .  famotidine (PEPCID) 20 MG tablet, Take 1 tablet (20 mg total) by mouth 2 (two) times daily., Disp: 90 tablet, Rfl: 3 .  hydrochlorothiazide (HYDRODIURIL) 25 MG tablet, Take 1 tablet (25 mg total) by mouth daily., Disp: 90 tablet, Rfl: 3 .  meloxicam (MOBIC) 15 MG tablet, Take 1 tablet (15 mg total) by mouth daily., Disp: 30 tablet, Rfl: 0 .  methocarbamol (ROBAXIN) 500 MG tablet, Take 1 tablet (500 mg total) by mouth every 8 (eight) hours as needed., Disp: 60 tablet, Rfl: 1 .  Teriflunomide (AUBAGIO) 14 MG TABS, TAKE 1 TABLET BY MOUTH 1 TIME A DAY., Disp: 30 tablet, Rfl: 11 .  Vitamin D, Ergocalciferol, (DRISDOL) 1.25 MG (50000 UNIT) CAPS capsule, Take 1 capsule (50,000 Units total) by mouth every 7 (seven) days., Disp: 13 capsule, Rfl: 3 .  NIFEdipine (ADALAT CC) 30 MG 24 hr tablet, Take 30 mg by mouth daily. (Patient not taking: Reported on 07/23/2020), Disp: , Rfl:  .  oxybutynin (DITROPAN XL) 10 MG 24 hr tablet, Take 1 tablet (10 mg total) by mouth at bedtime., Disp: 30 tablet, Rfl: 5  PAST MEDICAL HISTORY: Past Medical History:  Diagnosis Date  . Amyotrophic lateral sclerosis/progressive muscular atrophy ([place])   . Gait disturbance 06/25/2015  .  Hypertension   . 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 Low vitamin D level 03/25/2017  . Migraine   . Multiple sclerosis ([place])   . Neuromuscular disorder ([place])     PAST SURGICAL HISTORY: Past Surgical History:  Procedure Laterality Date  . BREAST BIOPSY Right   . CRANIOTOMY  08/17/2012   Procedure: CRANIOTOMY INTRACRANIAL ANEURYSM FOR CAROTID;  Surgeon: Winfield Cunas, MD;  Location: [facility];  Service: Neurosurgery;  Laterality: Left;  Craniotomy for Aneurysm Clipping    FAMILY HISTORY: Family History  Problem Relation Age of Onset  . Heart disease Father   . Dementia Other   . Breast cancer Neg Hx   . Colon cancer Neg Hx   . Colon polyps Neg Hx   . Esophageal cancer Neg Hx   . Liver cancer Neg Hx   . Ovarian cancer Neg Hx   . Pancreatic cancer Neg Hx   . Rectal cancer Neg Hx   . Stomach cancer  Neg Hx     SOCIAL HISTORY:  Social History   Socioeconomic History  . Marital status: Legally Separated    Spouse name: Not on file  . Number of children: Not on file  . Years of education: Not on file  . Highest education level: Not on file  Occupational History  . Not on file  Tobacco Use  . Smoking status: Current Every Day Smoker    Packs/day: 0.25    Years: 12.00    Pack years: 3.00    Types: Cigarettes  . Smokeless tobacco: Never Used  Vaping Use  . Vaping Use: Never used  Substance and Sexual Activity  . Alcohol use: Yes    Alcohol/week: 6.0 standard drinks    Types: 6 Cans of beer per week  . Drug use: Yes    Types: Marijuana    Comment: occasional  . Sexual activity: Not on file  Other Topics Concern  . Not on file  Social History Narrative   Patient does not drink caffeine.   Patient is right handed.    Social Determinants of Health   Financial Resource Strain:   . Difficulty of Paying Living Expenses:   Food Insecurity:   . Worried About Charity fundraiser in the Last Year:   . Arboriculturist in the Last Year:   Transportation Needs:     . Film/video editor (Medical):   Marland Kitchen Lack of Transportation (Non-Medical):   Physical Activity:   . Days of Exercise per Week:   . Minutes of Exercise per Session:   Stress:   . Feeling of Stress :   Social Connections:   . Frequency of Communication with Friends and Family:   . Frequency of Social Gatherings with Friends and Family:   . Attends Religious Services:   . Active Member of Clubs or Organizations:   . Attends Archivist Meetings:   Marland Kitchen Marital Status:   Intimate Partner Violence:   . Fear of Current or Ex-Partner:   . Emotionally Abused:   Marland Kitchen Physically Abused:   . Sexually Abused:      PHYSICAL EXAM  Vitals:   07/23/20 0843  BP: (!) 181/94  Pulse: 63  Weight: 121 lb (54.9 kg)  Height: 5\' 3"  (1.6 m)    Body mass index is 21.43 kg/m.   General: The patient is well-developed and well-nourished and in no acute distress  Musculoskeletal:   ROM of neck and shoulder are good.   Neurologic Exam  Mental status: The patient is alert and oriented x 3 at the time of the examination. The patient has apparent normal recent and remote memory, with an apparently normal attention span and concentration ability. Speech is normal.  Cranial nerves: Extraocular movements are full. Facial strength and sensation is normal. Trapezius strength is strong.  No obvious hearing problems  Motor:There is slightly reduced rapid altering movements of the right hand reported to the left muscle bulk is normal. Tone is normal. Strength is 5 / 5 in all 4 extremities.   Sensory: She had intact sensation to touch and vibration in the arms and legs..  Coordination: Good finger nose finger and heel to shin  Gait and station: Station is normal. Gait is slightly off balance  Tandem gait is mildly wide.  Romberg is negative.  Reflexes: Deep tendon reflexes are symmetric but increased at the knees with spread to the other side.     DIAGNOSTIC DATA (LABS, IMAGING,  TESTING) -  I reviewed patient records, labs, notes, testing and imaging myself where available.  Lab Results  Component Value Date   WBC 8.8 06/05/2020   HGB 13.6 06/05/2020   HCT 41.1 06/05/2020   MCV 101 (H) 06/05/2020   PLT 286 06/05/2020      Component Value Date/Time   NA 145 (H) 06/05/2020 0903   K 4.2 06/05/2020 0903   CL 102 06/05/2020 0903   CO2 28 06/05/2020 0903   GLUCOSE 80 06/05/2020 0903   GLUCOSE 77 03/23/2016 1443   BUN 16 06/05/2020 0903   CREATININE 0.90 06/05/2020 0903   CREATININE 0.85 03/23/2016 1443   CALCIUM 10.7 (H) 06/05/2020 0903   PROT 7.0 06/05/2020 0903   ALBUMIN 4.4 06/05/2020 0903   AST 16 06/05/2020 0903   ALT 9 06/05/2020 0903   ALKPHOS 67 06/05/2020 0903   BILITOT 0.4 06/05/2020 0903   GFRNONAA 69 06/05/2020 0903   GFRNONAA 76 03/23/2016 1443   GFRAA 79 06/05/2020 0903   GFRAA 87 03/23/2016 1443   Lab Results  Component Value Date   CHOL 259 (H) 04/23/2020   HDL 128 04/23/2020   LDLCALC 117 (H) 04/23/2020   TRIG 87 04/23/2020   CHOLHDL 2.0 04/23/2020   No results found for: HGBA1C No results found for: VITAMINB12 Lab Results  Component Value Date   TSH 1.110 03/25/2017       ASSESSMENT AND PLAN  Multiple sclerosis ([place]) - Plan: CBC with Differential/Platelet, Comprehensive metabolic panel, CBC with Differential/Platelet  High risk medication use - Plan: CBC with Differential/Platelet, Comprehensive metabolic panel, CBC with Differential/Platelet  Gait disturbance  Vitamin D deficiency   1.  We discussed the significance of the MRI and treatment options.  She will switch from Aubagio to Vumerity. 2.  Continue vitamin D supplements. 3.   Stay active and exercise as tolerated.   4.    She will return to see [place] in 6 months or sooner if there are new or worsening neurologic symptoms.  45-minute office visit with the majority of the time spent face-to-face for history and physical, discussion/counseling and  decision-making.  Additional time with record review and documentation.    Mccade Sullenberger A. Felecia Shelling, MD, PhD 0/93/2671, 2:45 AM Certified in Neurology, Clinical Neurophysiology, Sleep Medicine, Pain Medicine and Neuroimaging  [facility] [address], [place], [place] [address] [phone]

## 2020-07-24 ENCOUNTER — Telehealth: Payer: Self-pay

## 2020-07-24 ENCOUNTER — Telehealth: Payer: Self-pay | Admitting: *Deleted

## 2020-07-24 LAB — CBC WITH DIFFERENTIAL/PLATELET
Basophils Absolute: 0.1 x10E3/uL (ref 0.0–0.2)
Basos: 1 %
EOS (ABSOLUTE): 0.1 x10E3/uL (ref 0.0–0.4)
Eos: 1 %
Hematocrit: 40.9 % (ref 34.0–46.6)
Hemoglobin: 13.5 g/dL (ref 11.1–15.9)
Immature Grans (Abs): 0 x10E3/uL (ref 0.0–0.1)
Immature Granulocytes: 0 %
Lymphocytes Absolute: 2.8 x10E3/uL (ref 0.7–3.1)
Lymphs: 30 %
MCH: 32.7 pg (ref 26.6–33.0)
MCHC: 33 g/dL (ref 31.5–35.7)
MCV: 99 fL — ABNORMAL HIGH (ref 79–97)
Monocytes Absolute: 1 x10E3/uL — ABNORMAL HIGH (ref 0.1–0.9)
Monocytes: 11 %
Neutrophils Absolute: 5.4 x10E3/uL (ref 1.4–7.0)
Neutrophils: 57 %
Platelets: 302 x10E3/uL (ref 150–450)
RBC: 4.13 x10E6/uL (ref 3.77–5.28)
RDW: 12.7 % (ref 11.7–15.4)
WBC: 9.3 x10E3/uL (ref 3.4–10.8)

## 2020-07-24 LAB — COMPREHENSIVE METABOLIC PANEL
ALT: 10 IU/L (ref 0–32)
AST: 17 IU/L (ref 0–40)
Albumin/Globulin Ratio: 1.6 (ref 1.2–2.2)
Albumin: 4.6 g/dL (ref 3.8–4.8)
Alkaline Phosphatase: 64 IU/L (ref 48–121)
BUN/Creatinine Ratio: 17 (ref 12–28)
BUN: 13 mg/dL (ref 8–27)
Bilirubin Total: 0.7 mg/dL (ref 0.0–1.2)
CO2: 27 mmol/L (ref 20–29)
Calcium: 10.9 mg/dL — ABNORMAL HIGH (ref 8.7–10.3)
Chloride: 100 mmol/L (ref 96–106)
Creatinine, Ser: 0.78 mg/dL (ref 0.57–1.00)
GFR calc Af Amer: 94 mL/min/{1.73_m2} (ref >59)
GFR calc non Af Amer: 82 mL/min/{1.73_m2} (ref >59)
Globulin, Total: 2.9 g/dL (ref 1.5–4.5)
Glucose: 88 mg/dL (ref 65–99)
Potassium: 3.7 mmol/L (ref 3.5–5.2)
Sodium: 142 mmol/L (ref 134–144)
Total Protein: 7.5 g/dL (ref 6.0–8.5)

## 2020-07-24 NOTE — Telephone Encounter (Signed)
Called pt, relayed results per Dr. Felecia Shelling note. She verbalized understanding. She will try to come by today before 5pm to sign consent form for appeal for Vumerity PA denial. If not today, she will come by first thing on Monday morning.

## 2020-07-24 NOTE — Telephone Encounter (Signed)
PA for vumerity was denied by [facility] because pt has not tried and failed two preferred drugs such as: avonex, betaseron, copaxone, rebif, dalfampridine, or Tecfidera.  Pt needs to sign a consent for [place] to appeal this decision.  I called pt and explained this to her. She will come by this afternoon to sign this consent.  I spoke with Dr. Felecia Shelling. An appeal letter was completed. Pt will need a 2 week washout of aubagio before starting Vumerity.

## 2020-07-24 NOTE — Telephone Encounter (Signed)
-----   Message from Britt Bottom, MD sent at 07/24/2020 12:35 PM EDT ----- Please let her know that the lab work looks good.  Darlene Mcconnell is working on getting drug approval to switch to a different medicine

## 2020-07-24 NOTE — Progress Notes (Signed)
PA for vumerity starter kit completed on covemymeds. Key: [id number]. Sent to [place]. Should have determination in 3 business days.

## 2020-07-24 NOTE — Progress Notes (Signed)
PA was denied. See telephone note from 07/24/20.

## 2020-07-28 ENCOUNTER — Telehealth: Payer: Self-pay | Admitting: Neurology

## 2020-07-28 NOTE — Telephone Encounter (Signed)
The appeal for vumerity was completed on 07/24/2020 and faxed to [facility] appeals department the same day.

## 2020-07-28 NOTE — Telephone Encounter (Signed)
Noted. I have reached out to the vumerity rep for further guidance.

## 2020-07-28 NOTE — Telephone Encounter (Signed)
Received this notice from [facility]: "It looks like we sent the prescription to [facility] apologize, I'm not sure how [facility] was contacted.  From what I can tell, you do not need to send the RX to [facility]."

## 2020-07-28 NOTE — Telephone Encounter (Signed)
[facility] Janett Billow) called We do not have acess to Diroximel Fumarate (VUMERITY PO). There is another [facility] that can fill Diroximel Fumarate (VUMERITY PO). Their phone number is [phone].

## 2020-07-30 NOTE — Telephone Encounter (Signed)
Pt would like a call back from the nurse to explain the letter she receive concern [facility].

## 2020-07-30 NOTE — Telephone Encounter (Signed)
Called pt back to further discuss. Relayed KD,RN message below. She is unsure how [facility] got involved as well. She thinks she has been getting calls from [facility] but her phone rejects spam calls that it does not recognize. She is going to call [facility] back at [phone] to verify where she is going to be receiving her medication from. She will call our office back if she runs into any issues.

## 2020-07-31 NOTE — Telephone Encounter (Signed)
[facility] has called to relay that a Resubmission was done as of today for the vumerity if there are questions the call back # is [phone] Reference [id number]

## 2020-07-31 NOTE — Telephone Encounter (Signed)
Per note from KD,RN: "The appeal for vumerity was completed on 07/24/2020 and faxed to [facility] appeals department the same day"

## 2020-08-04 ENCOUNTER — Telehealth: Payer: Self-pay | Admitting: Neurology

## 2020-08-04 NOTE — Telephone Encounter (Signed)
[facility] (And[facility]) called PA has been denied for Diroximel Fumarate (VUMERITY PO). Would provider want to appeal or change medication? Would like a call back [phone]

## 2020-08-05 NOTE — Telephone Encounter (Signed)
Pt called back stating that she does not have her Diroximel Fumarate (VUMERITY PO) and is wanting to know when she will be able to get this medication. Pt states she has been trying to get a hold of the RN for two days to get updated on this. Please advise.

## 2020-08-05 NOTE — Telephone Encounter (Addendum)
Received appeal denial letter from [facility]. States pt must try/fail these meds first: Avonex, Betaseron, Brand Copaxone, Brand Tecfidera, Rebif rebidose, Gileya. Per Dr. Felecia Shelling, he would like to place her on Tecfidera instead. She will need to come by and sign start form for this. I called pt and relayed above info. She will try to come by today or tomorrow to sign Tecfidera start from. I placed this up front by check in so the ladies up front will have it available for pt to sign.

## 2020-08-05 NOTE — Telephone Encounter (Signed)
See other phone note. Medication is changing.

## 2020-08-05 NOTE — Telephone Encounter (Addendum)
Called [facility] appeals department at [phone] and spoke with [facility]. Provided PA# [id number] for her to check on status of appeal for pt Vumerity. Member ID: [id number]. She placed me on hold to transfer me to pharmacy appeal department. Their phone# [phone]. She was able to pull up appeal without pharmacy department. States neurology Market researcher reviewed appeal request and it was denied stating it did not meet requirements. She will denial letter to [place] at [phone]. Call ref# [id number]

## 2020-08-06 NOTE — Addendum Note (Signed)
Addended by: Wyvonnia Lora on: 08/06/2020 01:50 PM   Modules accepted: Orders

## 2020-08-06 NOTE — Telephone Encounter (Signed)
See other phone note from 07/28/20. Vumerity not covered by insurance. Changing pt to Tecfidera.

## 2020-08-06 NOTE — Telephone Encounter (Signed)
Faxed completed/signed Tecfidera start form to [employer] at [phone]. Received fax confirmation.  

## 2020-08-12 NOTE — Telephone Encounter (Signed)
I called pt. I advised her that [facility] has received her Tecfidera RX and will reach out to her shortly. Pt verbalized understanding.

## 2020-08-18 NOTE — Telephone Encounter (Signed)
I called pt. She spoke with a pharmacy (could not name pharmacy) and she should receive her tecfidera shipment on Wednesday of this week. I advised pt that I will check with her next week on how she is doing with the tecfidera. Pt verbalized understanding.

## 2020-08-20 NOTE — Telephone Encounter (Signed)
Received fax from [facility] that [facility] has confirmed shipment of [place]. Phone: [phone]. Fax: [phone].

## 2020-08-25 NOTE — Telephone Encounter (Signed)
I called pt. She received Tecfidera and has started taking it with no problems. She will call us for questions or concerns and keep her f/u for December as scheduled.

## 2020-12-08 ENCOUNTER — Encounter: Payer: Self-pay | Admitting: Family Medicine

## 2020-12-08 ENCOUNTER — Ambulatory Visit: Payer: Medicaid Other | Admitting: Family Medicine

## 2020-12-08 VITALS — BP 162/78 | HR 59 | Ht 63.0 in | Wt 120.0 lb

## 2020-12-08 DIAGNOSIS — I10 Essential (primary) hypertension: Secondary | ICD-10-CM | POA: Diagnosis not present

## 2020-12-08 DIAGNOSIS — E559 Vitamin D deficiency, unspecified: Secondary | ICD-10-CM | POA: Diagnosis not present

## 2020-12-08 DIAGNOSIS — G35 Multiple sclerosis: Secondary | ICD-10-CM

## 2020-12-08 DIAGNOSIS — Z79899 Other long term (current) drug therapy: Secondary | ICD-10-CM | POA: Diagnosis not present

## 2020-12-08 DIAGNOSIS — R5383 Other fatigue: Secondary | ICD-10-CM

## 2020-12-08 DIAGNOSIS — R269 Unspecified abnormalities of gait and mobility: Secondary | ICD-10-CM

## 2020-12-08 NOTE — Progress Notes (Signed)
Chief Complaint  Patient presents with  . Follow-up    TX RM  . Multiple Sclerosis    Pt said since its been cole her body hurts. Pt said her upper right shoulder hurts she gets stresses out and her lower hip hurts  when shes sleeping.     HISTORY OF PRESENT ILLNESS: Today 12/08/20  Darlene Mcconnell is a 64 y.o. female here today for follow up for RRMS. MRI in 06/2020 showed a large focus in the left posterior frontal lobe. She was switched from [place] to [place] but insurance denies coverage. She was started on dimethyl fumerate a few months ago. She is tolerating it fairly well but she admits that she has had a hard time getting used to taking a medication twice daily. She has set an alarm on her phone that is helping.   She denies new or exacerbating symptoms. She has had more difficulty with joint pain since the weather has been cold. She has some hip pain that bothers her at night. She has meloxicam at home but has not taken it recently. No changes in gait. She does feel off balance at times. She denies falls. No assistive devices.   She was last seen by PCP in 03/2020. She states that she has not taken any of her medications with exception of DMT in months. She thought her prescriptions had expired. Her blood pressure has been elevated. She denies headaches, blurred vision or difficulty breathing.    HISTORY (copied from previous note)  Darlene Mcconnell is a 64 y.o. woman who was diagnosed with relapsing remitting MS in July 2016.    Update 07/23/2020: She had a brain MRI showing a newer focus in the left posterior frontal lobe.  It did not enhance.  In retrospect she had noted that the right arm seemed more numb and clumsy for a while but it is better now than earlier in the year.    She has been on Aubagio since diagnosis in 2016 and she has tolerated it well.  Gait is a little off balance but she does not have any recent falls.  To do some at times when she walks.  The  right arm still seems slightly clumsier than the left but is mostly recovered.  She has some pain in her legs with a cramp-like sensation.  Sometimes she uses a heating pad with some benefit.  She has urinary urgency with occ urge incontinence.   She was referred to urology but she did not receive any medications.  We had a long discussion today about the significance of the MRI findings ans d options 1.  Stay on Aubagio 2.  Switch.   She would like to switch to Vumerity.     She notes some fatigue.  She sleeps well most nights but sometimes does go to bed too late.  She started the vitamin D supplements but missed took the instructions and was taking 50,000 units a day for 7 days.  We went over how to properly take the medication.  MS History:    She began to experience numbness in the right arm in early 2016. She noted numbness in the hand and higher up. A couple months later she began to have some pain in the shoulder and the neck area. On 05/07/2015, she had an MRI of the cervical spine that I personally reviewed. It shows a small focus of hyperintense signal adjacent to C2-C3 most evident on inversion recovery images but  more subtly present on axial images as well. It was suggestive of MS and an MRI of the brain was performed on 06/09/2015. I personally reviewed those images. That shows multiple white matter foci in a pattern consistent with the diagnosis of MS. Specifically, foci of present in the periventricular and juxtacortical white matter. There is one enhancing focus in the right occipital lobe. The presence of an enhancing lesion along with chronic periventricular and juxtacortical lesions and the presence of the spinal cord plaque allow [place] to make the diagnosis of multiple sclerosis with relative certainty.  She was started on Aubagio in 2016.  She did not have any definite exacerbation.  However, an MRI July 2021 showed a large new lesion in the left posterior frontal lobe.  She opted  to switch to Vumerity.  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 This was felt to be symptomatic as she had eyelid drooping and she also had severe headaches though there was no definite evidence of rupture. Surgery was performed by Dr. Christella Noa.   REVIEW OF SYSTEMS: Out of a complete 14 system review of symptoms, the patient complains only of the following symptoms, joint pain, fatigue and all other reviewed systems are negative.   ALLERGIES: Allergies  Allergen Reactions  . Lisinopril     Swelling of the lip     HOME MEDICATIONS: Outpatient Medications Prior to Visit  Medication Sig Dispense Refill  . Dimethyl Fumarate (TECFIDERA PO) Take by mouth. Brand name prescribed.  Titration pack: 120mg  po BIDx7 days then 240mg  po BID x23 days Maintenance dose: 240mg  po BID    . atorvastatin (LIPITOR) 40 MG tablet Take 1 tablet (40 mg total) by mouth daily. (Patient not taking: Reported on 12/08/2020) 90 tablet 3  . cholecalciferol (VITAMIN D3) 25 MCG (1000 UNIT) tablet TAKE 1 TABLET BY MOUTH EVERY DAY (Patient not taking: Reported on 12/08/2020) 60 tablet 1  . famotidine (PEPCID) 20 MG tablet Take 1 tablet (20 mg total) by mouth 2 (two) times daily. (Patient not taking: Reported on 12/08/2020) 90 tablet 3  . hydrochlorothiazide (HYDRODIURIL) 25 MG tablet Take 1 tablet (25 mg total) by mouth daily. (Patient not taking: Reported on 12/08/2020) 90 tablet 3  . meloxicam (MOBIC) 15 MG tablet Take 1 tablet (15 mg total) by mouth daily. (Patient not taking: Reported on 12/08/2020) 30 tablet 0  . methocarbamol (ROBAXIN) 500 MG tablet Take 1 tablet (500 mg total) by mouth every 8 (eight) hours as needed. (Patient not taking: Reported on 12/08/2020) 60 tablet 1  . NIFEdipine (ADALAT CC) 30 MG 24 hr tablet Take 30 mg by mouth daily. (Patient not taking: No sig reported)    . oxybutynin (DITROPAN XL) 10 MG 24 hr tablet Take 1 tablet (10 mg  total) by mouth at bedtime. (Patient not taking: Reported on 12/08/2020) 30 tablet 5  . Vitamin D, Ergocalciferol, (DRISDOL) 1.25 MG (50000 UNIT) CAPS capsule Take 1 capsule (50,000 Units total) by mouth every 7 (seven) days. (Patient not taking: Reported on 12/08/2020) 13 capsule 3   No facility-administered medications prior to visit.     PAST MEDICAL HISTORY: Past Medical History:  Diagnosis Date  . Amyotrophic lateral sclerosis/progressive muscular atrophy ([place])   . Gait disturbance 06/25/2015  . Hypertension   . 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 Low vitamin D level 03/25/2017  . Migraine   . Multiple sclerosis ([place])   . Neuromuscular  disorder ([place])      PAST SURGICAL HISTORY: Past Surgical History:  Procedure Laterality Date  . BREAST BIOPSY Right   . CRANIOTOMY  08/17/2012   Procedure: CRANIOTOMY INTRACRANIAL ANEURYSM FOR CAROTID;  Surgeon: Winfield Cunas, MD;  Location: [facility];  Service: Neurosurgery;  Laterality: Left;  Craniotomy for Aneurysm Clipping     FAMILY HISTORY: Family History  Problem Relation Age of Onset  . Heart disease Father   . Dementia Other   . Breast cancer Neg Hx   . Colon cancer Neg Hx   . Colon polyps Neg Hx   . Esophageal cancer Neg Hx   . Liver cancer Neg Hx   . Ovarian cancer Neg Hx   . Pancreatic cancer Neg Hx   . Rectal cancer Neg Hx   . Stomach cancer Neg Hx      SOCIAL HISTORY: Social History   Socioeconomic History  . Marital status: Legally Separated    Spouse name: Not on file  . Number of children: Not on file  . Years of education: Not on file  . Highest education level: Not on file  Occupational History  . Not on file  Tobacco Use  . Smoking status: Current Every Day Smoker    Packs/day: 0.25    Years: 12.00    Pack years: 3.00    Types: Cigarettes  . Smokeless tobacco: Never Used  Vaping Use  . Vaping Use: Never used  Substance and Sexual Activity  . Alcohol use: Yes    Alcohol/week: 6.0 standard  drinks    Types: 6 Cans of beer per week  . Drug use: Yes    Types: Marijuana    Comment: occasional  . Sexual activity: Not on file  Other Topics Concern  . Not on file  Social History Narrative   Patient does not drink caffeine.   Patient is right handed.    Social Determinants of Health   Financial Resource Strain: Not on file  Food Insecurity: Not on file  Transportation Needs: Not on file  Physical Activity: Not on file  Stress: Not on file  Social Connections: Not on file  Intimate Partner Violence: Not on file      PHYSICAL EXAM  Vitals:   12/08/20 0800 12/08/20 0842  BP: (!) 168/84 (!) 162/78  Pulse: (!) 59   Weight: 120 lb (54.4 kg)   Height: 5\' 3"  (1.6 m)    Body mass index is 21.26 kg/m.   Generalized: Well developed, in no acute distress  Cardiology: normal rate and rhythm, no murmur auscultated  Respiratory: clear to auscultation bilaterally    Neurological examination  Mentation: Alert oriented to time, place, history taking. Follows all commands speech and language fluent Cranial nerve II-XII: Pupils were equal round reactive to light. Extraocular movements were full, visual field were full on confrontational test. Facial sensation and strength were normal. Uvula tongue midline. Head turning and shoulder shrug  were normal and symmetric. Motor: The motor testing reveals 5 over 5 strength of all 4 extremities.  Sensory: Sensory testing is intact to soft touch on all 4 extremities. No evidence of extinction is noted.  Coordination: Cerebellar testing reveals good finger-nose-finger and heel-to-shin bilaterally.  Gait and station: Gait is normal.  Reflexes: Deep tendon reflexes are symmetric and normal bilaterally.     DIAGNOSTIC DATA (LABS, IMAGING, TESTING) - I reviewed patient records, labs, notes, testing and imaging myself where available.  Lab Results  Component Value Date  WBC 9.3 07/23/2020   HGB 13.5 07/23/2020   HCT 40.9 07/23/2020    MCV 99 (H) 07/23/2020   PLT 302 07/23/2020      Component Value Date/Time   NA 142 07/23/2020 0948   K 3.7 07/23/2020 0948   CL 100 07/23/2020 0948   CO2 27 07/23/2020 0948   GLUCOSE 88 07/23/2020 0948   GLUCOSE 77 03/23/2016 1443   BUN 13 07/23/2020 0948   CREATININE 0.78 07/23/2020 0948   CREATININE 0.85 03/23/2016 1443   CALCIUM 10.9 (H) 07/23/2020 0948   PROT 7.5 07/23/2020 0948   ALBUMIN 4.6 07/23/2020 0948   AST 17 07/23/2020 0948   ALT 10 07/23/2020 0948   ALKPHOS 64 07/23/2020 0948   BILITOT 0.7 07/23/2020 0948   GFRNONAA 82 07/23/2020 0948   GFRNONAA 76 03/23/2016 1443   GFRAA 94 07/23/2020 0948   GFRAA 87 03/23/2016 1443   Lab Results  Component Value Date   CHOL 259 (H) 04/23/2020   HDL 128 04/23/2020   LDLCALC 117 (H) 04/23/2020   TRIG 87 04/23/2020   CHOLHDL 2.0 04/23/2020   No results found for: HGBA1C No results found for: VITAMINB12 Lab Results  Component Value Date   TSH 1.110 03/25/2017    No flowsheet data found.   ASSESSMENT AND PLAN  64 y.o. year old female  has a past medical history of Amyotrophic lateral sclerosis/progressive muscular atrophy ([place]), Gait disturbance (06/25/2015), Hypertension, Intracranial aneurysm (2013), Low vitamin D level (03/25/2017), Migraine, Multiple sclerosis ([place]), and Neuromuscular disorder ([place]). here with   Multiple sclerosis ([place]) - Plan: CBC with Differential/Platelets, Hepatic Function Panel  High risk medication use - Plan: CBC with Differential/Platelets, Hepatic Function Panel  Vitamin D deficiency - Plan: Vitamin D, 25-hydroxy  Essential hypertension  Other fatigue  Gait disturbance  [place]ice is doing well from an MS standpoint. She has tolerated dimethyl fumerate without obvious adverse effects. She has had more joint pain recently and feels it is related to colder weather. I have encouraged her to call PCP asap to address elevated blood pressures. Medications were refilled by PCP for 1 year in  03/2020. I have encouraged her to resume HCTZ and atorvastatin asap. Stroke precautions reviewed. I will update labs today. I will restart vitamin D supplements pending lab work. She was encouraged to focus on healthy lifestyle changes. She will follow up with Dr Felecia Shelling in 6 months, sooner if needed. She verbalizes understanding and agreement with this plan.   Orders Placed This Encounter  Procedures  . CBC with Differential/Platelets  . Hepatic Function Panel  . Vitamin D, 25-hydroxy      I spent 30 minutes of face-to-face and non-face-to-face time with patient.  This included previsit chart review, lab review, study review, order entry, electronic health record documentation, patient education.    Debbora Presto, MSN, FNP-C 12/08/2020, 9:49 AM  [facility] [address], [place], [place] [address] [phone]

## 2020-12-08 NOTE — Patient Instructions (Signed)
Below is our plan:  We will continue dimethyl fumerate twice daily. I will update labs today. Please schedule follow up with PCP asap to monitor BP and cholesterol.   Please make sure you are staying well hydrated. I recommend 50-60 ounces daily. Well balanced diet and regular exercise encouraged.    Please continue follow up with care team as directed.   Follow up with Dr Felecia Shelling in 6 months   You may receive a survey regarding today's visit. I encourage you to leave honest feed back as I do use this information to improve patient care. Thank you for seeing me today!     Managing Your Hypertension Hypertension is commonly called high blood pressure. This is when the force of your blood pressing against the walls of your arteries is too strong. Arteries are blood vessels that carry blood from your heart throughout your body. Hypertension forces the heart to work harder to pump blood, and may cause the arteries to become narrow or stiff. Having untreated or uncontrolled hypertension can cause heart attack, stroke, kidney disease, and other problems. What are blood pressure readings? A blood pressure reading consists of a higher number over a lower number. Ideally, your blood pressure should be below 120/80. The first ("top") number is called the systolic pressure. It is a measure of the pressure in your arteries as your heart beats. The second ("bottom") number is called the diastolic pressure. It is a measure of the pressure in your arteries as the heart relaxes. What does my blood pressure reading mean? Blood pressure is classified into four stages. Based on your blood pressure reading, your health care provider may use the following stages to determine what type of treatment you need, if any. Systolic pressure and diastolic pressure are measured in a unit called mm Hg. Normal  Systolic pressure: below 027.  Diastolic pressure: below 80. Elevated  Systolic pressure: 741-287.  Diastolic  pressure: below 80. Hypertension stage 1  Systolic pressure: 867-672.  Diastolic pressure: 09-47. Hypertension stage 2  Systolic pressure: 096 or above.  Diastolic pressure: 90 or above. What health risks are associated with hypertension? Managing your hypertension is an important responsibility. Uncontrolled hypertension can lead to:  A heart attack.  A stroke.  A weakened blood vessel (aneurysm).  Heart failure.  Kidney damage.  Eye damage.  Metabolic syndrome.  Memory and concentration problems. What changes can I make to manage my hypertension? Hypertension can be managed by making lifestyle changes and possibly by taking medicines. Your health care provider will help you make a plan to bring your blood pressure within a normal range. Eating and drinking   Eat a diet that is high in fiber and potassium, and low in salt (sodium), added sugar, and fat. An example eating plan is called the DASH (Dietary Approaches to Stop Hypertension) diet. To eat this way: ? Eat plenty of fresh fruits and vegetables. Try to fill half of your plate at each meal with fruits and vegetables. ? Eat whole grains, such as whole wheat pasta, brown rice, or whole grain bread. Fill about one quarter of your plate with whole grains. ? Eat low-fat diary products. ? Avoid fatty cuts of meat, processed or cured meats, and poultry with skin. Fill about one quarter of your plate with lean proteins such as fish, chicken without skin, beans, eggs, and tofu. ? Avoid premade and processed foods. These tend to be higher in sodium, added sugar, and fat.  Reduce your daily sodium intake.  Most people with hypertension should eat less than 1,500 mg of sodium a day.  Limit alcohol intake to no more than 1 drink a day for nonpregnant women and 2 drinks a day for men. One drink equals 12 oz of beer, 5 oz of wine, or 1 oz of hard liquor. Lifestyle  Work with your health care provider to maintain a healthy body  weight, or to lose weight. Ask what an ideal weight is for you.  Get at least 30 minutes of exercise that causes your heart to beat faster (aerobic exercise) most days of the week. Activities may include walking, swimming, or biking.  Include exercise to strengthen your muscles (resistance exercise), such as weight lifting, as part of your weekly exercise routine. Try to do these types of exercises for 30 minutes at least 3 days a week.  Do not use any products that contain nicotine or tobacco, such as cigarettes and e-cigarettes. If you need help quitting, ask your health care provider.  Control any long-term (chronic) conditions you have, such as high cholesterol or diabetes. Monitoring  Monitor your blood pressure at home as told by your health care provider. Your personal target blood pressure may vary depending on your medical conditions, your age, and other factors.  Have your blood pressure checked regularly, as often as told by your health care provider. Working with your health care provider  Review all the medicines you take with your health care provider because there may be side effects or interactions.  Talk with your health care provider about your diet, exercise habits, and other lifestyle factors that may be contributing to hypertension.  Visit your health care provider regularly. Your health care provider can help you create and adjust your plan for managing hypertension. Will I need medicine to control my blood pressure? Your health care provider may prescribe medicine if lifestyle changes are not enough to get your blood pressure under control, and if:  Your systolic blood pressure is 130 or higher.  Your diastolic blood pressure is 80 or higher. Take medicines only as told by your health care provider. Follow the directions carefully. Blood pressure medicines must be taken as prescribed. The medicine does not work as well when you skip doses. Skipping doses also puts  you at risk for problems. Contact a health care provider if:  You think you are having a reaction to medicines you have taken.  You have repeated (recurrent) headaches.  You feel dizzy.  You have swelling in your ankles.  You have trouble with your vision. Get help right away if:  You develop a severe headache or confusion.  You have unusual weakness or numbness, or you feel faint.  You have severe pain in your chest or abdomen.  You vomit repeatedly.  You have trouble breathing. Summary  Hypertension is when the force of blood pumping through your arteries is too strong. If this condition is not controlled, it may put you at risk for serious complications.  Your personal target blood pressure may vary depending on your medical conditions, your age, and other factors. For most people, a normal blood pressure is less than 120/80.  Hypertension is managed by lifestyle changes, medicines, or both. Lifestyle changes include weight loss, eating a healthy, low-sodium diet, exercising more, and limiting alcohol. This information is not intended to replace advice given to you by your health care provider. Make sure you discuss any questions you have with your health care provider. Document Revised: 04/06/2019 Document Reviewed:  11/10/2016 Elsevier Patient Education  2020 [place].   Multiple Sclerosis Multiple sclerosis (MS) is a disease of the brain, spinal cord, and optic nerves (central nervous system). It causes the body's disease-fighting (immune) system to destroy the protective covering (myelin sheath) around nerves in the brain. When this happens, signals (nerve impulses) going to and from the brain and spinal cord do not get sent properly or may not get sent at all. There are several types of MS:  Relapsing-remitting MS. This is the most common type. This causes sudden attacks of symptoms. After an attack, you may recover completely until the next attack, or some symptoms  may remain permanently.  Secondary progressive MS. This usually develops after the onset of relapsing-remitting MS. Similar to relapsing-remitting MS, this type also causes sudden attacks of symptoms. Attacks may be less frequent, but symptoms slowly get worse (progress) over time.  Primary progressive MS. This causes symptoms that steadily progress over time. This type of MS does not cause sudden attacks of symptoms. The age of onset of MS varies, but it often develops between 80-15 years of age. MS is a lifelong (chronic) condition. There is no cure, but treatment can help slow down the progression of the disease. What are the causes? The cause of this condition is not known. What increases the risk? You are more likely to develop this condition if:  You are a woman.  You have a relative with MS. However, the condition is not passed from parent to child (inherited).  You have a lack (deficiency) of vitamin D.  You smoke. MS is more common in the [place] than in the [place]. What are the signs or symptoms? Relapsing-remitting and secondary progressive MS cause symptoms to occur in episodes or attacks that may last weeks to months. There may be long periods between attacks in which there are almost no symptoms. Primary progressive MS causes symptoms to steadily progress after they develop. Symptoms of MS vary because of the many different ways it affects the central nervous system. The main symptoms include:  Vision problems and eye pain.  Numbness.  Weakness.  Inability to move your arms, hands, feet, or legs (paralysis).  Balance problems.  Shaking that you cannot control (tremors).  Muscle spasms.  Problems with thinking (cognitive changes). MS can also cause symptoms that are associated with the disease, but are not always the direct result of an MS attack. They may include:  Inability to control urination or bowel movements  (incontinence).  Headaches.  Fatigue.  Inability to tolerate heat.  Emotional changes.  Depression.  Pain. How is this diagnosed? This condition is diagnosed based on:  Your symptoms.  A neurological exam. This involves checking central nervous system function, such as nerve function, reflexes, and coordination.  MRIs of the brain and spinal cord.  Lab tests, including a lumbar puncture that tests the fluid that surrounds the brain and spinal cord (cerebrospinal fluid).  Tests to measure the electrical activity of the brain in response to stimulation (evoked potentials). How is this treated? There is no cure for MS, but medicines can help decrease the number and frequency of attacks and help relieve nuisance symptoms. Treatment options may include:  Medicines that reduce the frequency of attacks. These medicines may be given by injection, by mouth (orally), or through an IV.  Medicines that reduce inflammation (steroids). These may provide short-term relief of symptoms.  Medicines to help control pain, depression, fatigue, or incontinence.  Vitamin  D, if you have a deficiency.  Using devices to help you move around (assistive devices), such as braces, a cane, or a walker.  Physical therapy to strengthen and stretch your muscles.  Occupational therapy to help you with everyday tasks.  Alternative or complementary treatments such as exercise, massage, or acupuncture. Follow these instructions at home:  Take over-the-counter and prescription medicines only as told by your health care provider.  Do not drive or use heavy machinery while taking prescription pain medicine.  Use assistive devices as recommended by your physical therapist or your health care provider.  Exercise as directed by your health care provider.  Return to your normal activities as told by your health care provider. Ask your health care provider what activities are safe for you.  Reach out for  support. Share your feelings with friends, family, or a support group.  Keep all follow-up visits as told by your health care provider and therapists. This is important. Where to find more information  National Multiple Sclerosis Society: https://www.nationalmssociety.org Contact a health care provider if:  You feel depressed.  You develop new pain or numbness.  You have tremors.  You have problems with sexual function. Get help right away if:  You develop paralysis.  You develop numbness.  You have problems with your bladder or bowel function.  You develop double vision.  You lose vision in one or both eyes.  You develop suicidal thoughts.  You develop severe confusion. If you ever feel like you may hurt yourself or others, or have thoughts about taking your own life, get help right away. You can go to your nearest emergency department or call:  Your local emergency services (911 in the [place]  A suicide crisis helpline, such as the [place] at 628-498-0657. This is open 24 hours a day. Summary  Multiple sclerosis (MS) is a disease of the central nervous system that causes the body's immune system to destroy the protective covering (myelin sheath) around nerves in the brain.  There are 3 types of MS: relapsing-remitting, secondary progressive, and primary progressive. Relapsing-remitting and secondary progressive MS cause symptoms to occur in episodes or attacks that may last weeks to months. Primary progressive MS causes symptoms to steadily progress after they develop.  There is no cure for MS, but medicines can help decrease the number and frequency of attacks and help relieve nuisance symptoms. Treatment may also include physical or occupational therapy.  If you develop numbness, paralysis, vision problems, or other neurological symptoms, get help right away. This information is not intended to replace advice given to you by your health  care provider. Make sure you discuss any questions you have with your health care provider. Document Revised: 11/25/2017 Document Reviewed: 02/21/2017 Elsevier Patient Education  2020 [employer].

## 2020-12-08 NOTE — Progress Notes (Signed)
I have read the note, and I agree with the clinical assessment and plan.  Vuong Musa A. Chaley Castellanos, MD, PhD, FAAN Certified in Neurology, Clinical Neurophysiology, Sleep Medicine, Pain Medicine and Neuroimaging  [facility] [address], [address] [place], [place] [address] [phone]  

## 2020-12-09 ENCOUNTER — Telehealth: Payer: Self-pay | Admitting: *Deleted

## 2020-12-09 LAB — CBC WITH DIFFERENTIAL/PLATELET
Basophils Absolute: 0.1 10*3/uL (ref 0.0–0.2)
Basos: 1 %
EOS (ABSOLUTE): 0.1 10*3/uL (ref 0.0–0.4)
Eos: 1 %
Hematocrit: 38.4 % (ref 34.0–46.6)
Hemoglobin: 13.2 g/dL (ref 11.1–15.9)
Immature Grans (Abs): 0 10*3/uL (ref 0.0–0.1)
Immature Granulocytes: 0 %
Lymphocytes Absolute: 2.3 10*3/uL (ref 0.7–3.1)
Lymphs: 21 %
MCH: 34 pg — ABNORMAL HIGH (ref 26.6–33.0)
MCHC: 34.4 g/dL (ref 31.5–35.7)
MCV: 99 fL — ABNORMAL HIGH (ref 79–97)
Monocytes Absolute: 0.9 10*3/uL (ref 0.1–0.9)
Monocytes: 9 %
Neutrophils Absolute: 7.3 10*3/uL — ABNORMAL HIGH (ref 1.4–7.0)
Neutrophils: 68 %
Platelets: 324 10*3/uL (ref 150–450)
RBC: 3.88 x10E6/uL (ref 3.77–5.28)
RDW: 12.9 % (ref 11.7–15.4)
WBC: 10.7 10*3/uL (ref 3.4–10.8)

## 2020-12-09 LAB — HEPATIC FUNCTION PANEL
ALT: 12 IU/L (ref 0–32)
AST: 16 IU/L (ref 0–40)
Albumin: 4.5 g/dL (ref 3.8–4.8)
Alkaline Phosphatase: 64 IU/L (ref 44–121)
Bilirubin Total: 0.4 mg/dL (ref 0.0–1.2)
Bilirubin, Direct: 0.11 mg/dL (ref 0.00–0.40)
Total Protein: 7 g/dL (ref 6.0–8.5)

## 2020-12-09 LAB — VITAMIN D 25 HYDROXY (VIT D DEFICIENCY, FRACTURES): Vit D, 25-Hydroxy: 14.6 ng/mL — ABNORMAL LOW (ref 30.0–100.0)

## 2020-12-09 NOTE — Telephone Encounter (Signed)
-----   Message from Debbora Presto, NP sent at 12/09/2020  4:39 PM EST ----- Labs look ok with the exception of her vitamin D. It is still fairly low at 14. I would like for her to start 5,000iu daily and continue until she is seen again. We will recheck levels at that time.

## 2020-12-09 NOTE — Telephone Encounter (Signed)
I called pt and relayed her lab results her vit D still fairly low, start taking 5000u daily.  She wanted prescription cheaper for her to get prescription.

## 2020-12-10 MED ORDER — VITAMIN D-3 125 MCG (5000 UT) PO TABS: ORAL_TABLET | ORAL | 5 refills | Status: AC

## 2021-02-19 ENCOUNTER — Emergency Department (HOSPITAL_COMMUNITY)
Admission: EM | Admit: 2021-02-19 | Discharge: 2021-02-24 | Disposition: E | Payer: Medicaid Other | Attending: Emergency Medicine | Admitting: Emergency Medicine

## 2021-02-19 ENCOUNTER — Emergency Department (HOSPITAL_COMMUNITY): Payer: Medicaid Other

## 2021-02-19 DIAGNOSIS — I10 Essential (primary) hypertension: Secondary | ICD-10-CM | POA: Diagnosis not present

## 2021-02-19 DIAGNOSIS — R0789 Other chest pain: Secondary | ICD-10-CM | POA: Diagnosis not present

## 2021-02-19 DIAGNOSIS — F17210 Nicotine dependence, cigarettes, uncomplicated: Secondary | ICD-10-CM | POA: Insufficient documentation

## 2021-02-19 DIAGNOSIS — I213 ST elevation (STEMI) myocardial infarction of unspecified site: Secondary | ICD-10-CM | POA: Diagnosis not present

## 2021-02-19 DIAGNOSIS — R079 Chest pain, unspecified: Secondary | ICD-10-CM | POA: Diagnosis not present

## 2021-02-19 DIAGNOSIS — R072 Precordial pain: Secondary | ICD-10-CM | POA: Diagnosis present

## 2021-02-19 DIAGNOSIS — J189 Pneumonia, unspecified organism: Secondary | ICD-10-CM | POA: Diagnosis not present

## 2021-02-19 DIAGNOSIS — R0602 Shortness of breath: Secondary | ICD-10-CM | POA: Diagnosis not present

## 2021-02-19 DIAGNOSIS — R069 Unspecified abnormalities of breathing: Secondary | ICD-10-CM | POA: Diagnosis not present

## 2021-02-19 DIAGNOSIS — I214 Non-ST elevation (NSTEMI) myocardial infarction: Secondary | ICD-10-CM | POA: Insufficient documentation

## 2021-02-19 DIAGNOSIS — Z79899 Other long term (current) drug therapy: Secondary | ICD-10-CM | POA: Diagnosis not present

## 2021-02-19 DIAGNOSIS — R0902 Hypoxemia: Secondary | ICD-10-CM | POA: Diagnosis not present

## 2021-02-19 LAB — CBC WITH DIFFERENTIAL/PLATELET
Abs Immature Granulocytes: 0.07 10*3/uL (ref 0.00–0.07)
Basophils Absolute: 0 10*3/uL (ref 0.0–0.1)
Basophils Relative: 0 %
Eosinophils Absolute: 0 10*3/uL (ref 0.0–0.5)
Eosinophils Relative: 0 %
HCT: 39.8 % (ref 36.0–46.0)
Hemoglobin: 13.7 g/dL (ref 12.0–15.0)
Immature Granulocytes: 1 %
Lymphocytes Relative: 4 %
Lymphs Abs: 0.6 10*3/uL — ABNORMAL LOW (ref 0.7–4.0)
MCH: 34.4 pg — ABNORMAL HIGH (ref 26.0–34.0)
MCHC: 34.4 g/dL (ref 30.0–36.0)
MCV: 100 fL (ref 80.0–100.0)
Monocytes Absolute: 0.8 10*3/uL (ref 0.1–1.0)
Monocytes Relative: 6 %
Neutro Abs: 13.3 10*3/uL — ABNORMAL HIGH (ref 1.7–7.7)
Neutrophils Relative %: 89 %
Platelets: 293 10*3/uL (ref 150–400)
RBC: 3.98 MIL/uL (ref 3.87–5.11)
RDW: 14.3 % (ref 11.5–15.5)
WBC: 14.8 10*3/uL — ABNORMAL HIGH (ref 4.0–10.5)
nRBC: 0 % (ref 0.0–0.2)

## 2021-02-19 LAB — I-STAT CHEM 8, ED
BUN: 13 mg/dL (ref 8–23)
Calcium, Ion: 1.1 mmol/L — ABNORMAL LOW (ref 1.15–1.40)
Chloride: 100 mmol/L (ref 98–111)
Creatinine, Ser: 0.7 mg/dL (ref 0.44–1.00)
Glucose, Bld: 169 mg/dL — ABNORMAL HIGH (ref 70–99)
HCT: 44 % (ref 36.0–46.0)
Hemoglobin: 15 g/dL (ref 12.0–15.0)
Potassium: 3.2 mmol/L — ABNORMAL LOW (ref 3.5–5.1)
Sodium: 141 mmol/L (ref 135–145)
TCO2: 28 mmol/L (ref 22–32)

## 2021-02-19 LAB — COMPREHENSIVE METABOLIC PANEL
ALT: 302 U/L — ABNORMAL HIGH (ref 0–44)
AST: 640 U/L — ABNORMAL HIGH (ref 15–41)
Albumin: 4.1 g/dL (ref 3.5–5.0)
Alkaline Phosphatase: 75 U/L (ref 38–126)
Anion gap: 13 (ref 5–15)
BUN: 12 mg/dL (ref 8–23)
CO2: 26 mmol/L (ref 22–32)
Calcium: 9.6 mg/dL (ref 8.9–10.3)
Chloride: 100 mmol/L (ref 98–111)
Creatinine, Ser: 0.83 mg/dL (ref 0.44–1.00)
GFR, Estimated: >60 mL/min (ref >60)
Glucose, Bld: 171 mg/dL — ABNORMAL HIGH (ref 70–99)
Potassium: 3.2 mmol/L — ABNORMAL LOW (ref 3.5–5.1)
Sodium: 139 mmol/L (ref 135–145)
Total Bilirubin: 0.9 mg/dL (ref 0.3–1.2)
Total Protein: 7.3 g/dL (ref 6.5–8.1)

## 2021-02-19 LAB — TROPONIN I (HIGH SENSITIVITY): Troponin I (High Sensitivity): >27000 ng/L (ref <18)

## 2021-02-19 LAB — LIPASE, BLOOD: Lipase: 24 U/L (ref 11–51)

## 2021-02-19 MED ORDER — EPINEPHRINE 1 MG/10ML IJ SOSY
PREFILLED_SYRINGE | INTRAMUSCULAR | Status: AC | PRN
  Administered 2021-02-19 (×4): 1 via INTRAVENOUS

## 2021-02-19 MED ORDER — CALCIUM CHLORIDE 10 % IV SOLN
INTRAVENOUS | Status: AC | PRN
  Administered 2021-02-19: 1 g via INTRAVENOUS

## 2021-02-19 MED ORDER — FENTANYL CITRATE (PF) 100 MCG/2ML IJ SOLN: 50.0000 ug | Freq: Once | INTRAMUSCULAR | Status: DC

## 2021-02-19 MED ORDER — EPINEPHRINE 1 MG/10ML IJ SOSY
PREFILLED_SYRINGE | INTRAMUSCULAR | Status: AC | PRN
  Administered 2021-02-19: 1 via INTRAVENOUS

## 2021-02-24 ENCOUNTER — Telehealth: Payer: Self-pay

## 2021-02-24 NOTE — ED Notes (Signed)
Pt to ED via [facility] from home, writhing in pain, c/o severe chest pain into back-- gray

## 2021-02-24 NOTE — ED Notes (Signed)
Pt had blowing/ snoring resp - asystole - CPR started

## 2021-02-24 NOTE — Code Documentation (Signed)
CPR was started at 1440,  EPI at 1442- pulse check- [facility]

## 2021-02-24 NOTE — ED Notes (Signed)
CPR called per Dr. Burman Foster - no pulses/no spontaneous resp.
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
## 2021-02-24 NOTE — Telephone Encounter (Signed)
Death Certificate was dropped off by [facility] and has been placed in PCP's box for completion. Please use blue or black ink. Please return to [facility] once completed.

## 2021-02-24 NOTE — Progress Notes (Signed)
Responded to ED page to support patient and staff.  Patient passed.  Nurse asked that family be called.  Family was called and I spoke with a daughter and another daughter is on way to hospital.  Provided support to staff nurse and will support family as needed.    Jaclynn Major, [place], [facility], Pager [phone]

## 2021-02-24 DEATH — deceased

## 2021-02-25 ENCOUNTER — Telehealth: Payer: Self-pay

## 2021-02-25 NOTE — Telephone Encounter (Signed)
[place] was notified and death certificate was picked up 02/25/21.   [facility] Practice Administrator has been notified.

## 2021-02-25 NOTE — Telephone Encounter (Signed)
I completed death certificate with help from Dr. Susa Simmonds.

## 2021-06-08 ENCOUNTER — Ambulatory Visit: Payer: Medicaid Other | Admitting: Family Medicine
